# Patient Record
Sex: Male | Born: 1970 | Race: White | Hispanic: No | Marital: Single | Smoking: Former smoker
Health system: Southern US, Community
[De-identification: ages and names within clinical notes are randomized; demographics above are authoritative.]

## PROBLEM LIST (undated history)

## (undated) DIAGNOSIS — F419 Anxiety disorder, unspecified: Secondary | ICD-10-CM

## (undated) DIAGNOSIS — I219 Acute myocardial infarction, unspecified: Secondary | ICD-10-CM

## (undated) DIAGNOSIS — F32A Depression, unspecified: Secondary | ICD-10-CM

## (undated) DIAGNOSIS — E785 Hyperlipidemia, unspecified: Secondary | ICD-10-CM

## (undated) DIAGNOSIS — F329 Major depressive disorder, single episode, unspecified: Secondary | ICD-10-CM

## (undated) DIAGNOSIS — I10 Essential (primary) hypertension: Secondary | ICD-10-CM

## (undated) DIAGNOSIS — J189 Pneumonia, unspecified organism: Secondary | ICD-10-CM

## (undated) DIAGNOSIS — I2510 Atherosclerotic heart disease of native coronary artery without angina pectoris: Secondary | ICD-10-CM

## (undated) DIAGNOSIS — J309 Allergic rhinitis, unspecified: Secondary | ICD-10-CM

## (undated) DIAGNOSIS — K859 Acute pancreatitis without necrosis or infection, unspecified: Secondary | ICD-10-CM

## (undated) DIAGNOSIS — K219 Gastro-esophageal reflux disease without esophagitis: Secondary | ICD-10-CM

## (undated) DIAGNOSIS — J45909 Unspecified asthma, uncomplicated: Secondary | ICD-10-CM

## (undated) HISTORY — DX: Depression, unspecified: F32.A

## (undated) HISTORY — DX: Acute pancreatitis without necrosis or infection, unspecified: K85.90

## (undated) HISTORY — PX: CARDIAC CATHETERIZATION: SHX172

## (undated) HISTORY — DX: Gastro-esophageal reflux disease without esophagitis: K21.9

## (undated) HISTORY — PX: APPENDECTOMY: SHX54

## (undated) HISTORY — DX: Allergic rhinitis, unspecified: J30.9

## (undated) HISTORY — DX: Unspecified asthma, uncomplicated: J45.909

## (undated) HISTORY — DX: Atherosclerotic heart disease of native coronary artery without angina pectoris: I25.10

---
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
## 2013-08-24 ENCOUNTER — Inpatient Hospital Stay (HOSPITAL_COMMUNITY)
Admission: EM | Admit: 2013-08-24 | Discharge: 2013-08-26 | DRG: 195 | Disposition: A | Discharge status: Home / Self Care | Payer: 59 | Attending: Internal Medicine | Admitting: Internal Medicine

## 2013-08-24 ENCOUNTER — Encounter (HOSPITAL_COMMUNITY): Payer: Self-pay | Admitting: Emergency Medicine

## 2013-08-24 DIAGNOSIS — Z7902 Long term (current) use of antithrombotics/antiplatelets: Secondary | ICD-10-CM

## 2013-08-24 DIAGNOSIS — Z79899 Other long term (current) drug therapy: Secondary | ICD-10-CM | POA: Diagnosis not present

## 2013-08-24 DIAGNOSIS — R001 Bradycardia, unspecified: Secondary | ICD-10-CM

## 2013-08-24 DIAGNOSIS — R0602 Shortness of breath: Secondary | ICD-10-CM

## 2013-08-24 DIAGNOSIS — E876 Hypokalemia: Secondary | ICD-10-CM | POA: Diagnosis present

## 2013-08-24 DIAGNOSIS — I252 Old myocardial infarction: Secondary | ICD-10-CM

## 2013-08-24 DIAGNOSIS — R05 Cough: Secondary | ICD-10-CM

## 2013-08-24 DIAGNOSIS — Z9861 Coronary angioplasty status: Secondary | ICD-10-CM | POA: Diagnosis not present

## 2013-08-24 DIAGNOSIS — R059 Cough, unspecified: Secondary | ICD-10-CM

## 2013-08-24 DIAGNOSIS — D509 Iron deficiency anemia, unspecified: Secondary | ICD-10-CM

## 2013-08-24 DIAGNOSIS — F3289 Other specified depressive episodes: Secondary | ICD-10-CM | POA: Diagnosis present

## 2013-08-24 DIAGNOSIS — F411 Generalized anxiety disorder: Secondary | ICD-10-CM | POA: Diagnosis present

## 2013-08-24 DIAGNOSIS — R0789 Other chest pain: Secondary | ICD-10-CM

## 2013-08-24 DIAGNOSIS — E785 Hyperlipidemia, unspecified: Secondary | ICD-10-CM

## 2013-08-24 DIAGNOSIS — J329 Chronic sinusitis, unspecified: Secondary | ICD-10-CM | POA: Diagnosis present

## 2013-08-24 DIAGNOSIS — I10 Essential (primary) hypertension: Secondary | ICD-10-CM | POA: Diagnosis present

## 2013-08-24 DIAGNOSIS — Z7982 Long term (current) use of aspirin: Secondary | ICD-10-CM | POA: Diagnosis not present

## 2013-08-24 DIAGNOSIS — I498 Other specified cardiac arrhythmias: Secondary | ICD-10-CM | POA: Diagnosis present

## 2013-08-24 DIAGNOSIS — F329 Major depressive disorder, single episode, unspecified: Secondary | ICD-10-CM

## 2013-08-24 DIAGNOSIS — J189 Pneumonia, unspecified organism: Secondary | ICD-10-CM | POA: Diagnosis present

## 2013-08-24 DIAGNOSIS — I2510 Atherosclerotic heart disease of native coronary artery without angina pectoris: Secondary | ICD-10-CM | POA: Diagnosis present

## 2013-08-24 HISTORY — DX: Hyperlipidemia, unspecified: E78.5

## 2013-08-24 HISTORY — DX: Pneumonia, unspecified organism: J18.9

## 2013-08-24 HISTORY — DX: Acute myocardial infarction, unspecified: I21.9

## 2013-08-24 HISTORY — DX: Essential (primary) hypertension: I10

## 2013-08-24 HISTORY — DX: Anxiety disorder, unspecified: F41.9

## 2013-08-24 LAB — CBC WITH DIFFERENTIAL/PLATELET
BASOS ABS: 0 10*3/uL (ref 0.0–0.1)
Basophils Relative: 0 % (ref 0–1)
EOS PCT: 5 % (ref 0–5)
Eosinophils Absolute: 0.4 10*3/uL (ref 0.0–0.7)
HEMATOCRIT: 32.8 % — AB (ref 39.0–52.0)
Hemoglobin: 11.1 g/dL — ABNORMAL LOW (ref 13.0–17.0)
LYMPHS ABS: 1.1 10*3/uL (ref 0.7–4.0)
LYMPHS PCT: 17 % (ref 12–46)
MCH: 26.9 pg (ref 26.0–34.0)
MCHC: 33.8 g/dL (ref 30.0–36.0)
MCV: 79.4 fL (ref 78.0–100.0)
MONO ABS: 0.4 10*3/uL (ref 0.1–1.0)
MONOS PCT: 6 % (ref 3–12)
NEUTROS ABS: 4.7 10*3/uL (ref 1.7–7.7)
Neutrophils Relative %: 72 % (ref 43–77)
Platelets: 225 10*3/uL (ref 150–400)
RBC: 4.13 MIL/uL — AB (ref 4.22–5.81)
RDW: 12.6 % (ref 11.5–15.5)
WBC: 6.6 10*3/uL (ref 4.0–10.5)

## 2013-08-24 LAB — COMPREHENSIVE METABOLIC PANEL
ALT: 28 U/L (ref 0–53)
AST: 25 U/L (ref 0–37)
Albumin: 3.5 g/dL (ref 3.5–5.2)
Alkaline Phosphatase: 67 U/L (ref 39–117)
Anion gap: 13 (ref 5–15)
BILIRUBIN TOTAL: 0.5 mg/dL (ref 0.3–1.2)
BUN: 10 mg/dL (ref 6–23)
CHLORIDE: 99 meq/L (ref 96–112)
CO2: 27 meq/L (ref 19–32)
Calcium: 9.1 mg/dL (ref 8.4–10.5)
Creatinine, Ser: 0.83 mg/dL (ref 0.50–1.35)
GFR calc non Af Amer: >90 mL/min (ref >90)
Glucose, Bld: 99 mg/dL (ref 70–99)
Potassium: 3.5 mEq/L — ABNORMAL LOW (ref 3.7–5.3)
Sodium: 139 mEq/L (ref 137–147)
Total Protein: 7.4 g/dL (ref 6.0–8.3)

## 2013-08-24 LAB — CBC
HCT: 28 % — ABNORMAL LOW (ref 39.0–52.0)
Hemoglobin: 9.5 g/dL — ABNORMAL LOW (ref 13.0–17.0)
MCH: 26.9 pg (ref 26.0–34.0)
MCHC: 33.9 g/dL (ref 30.0–36.0)
MCV: 79.3 fL (ref 78.0–100.0)
PLATELETS: 184 10*3/uL (ref 150–400)
RBC: 3.53 MIL/uL — ABNORMAL LOW (ref 4.22–5.81)
RDW: 12.7 % (ref 11.5–15.5)
WBC: 5 10*3/uL (ref 4.0–10.5)

## 2013-08-24 LAB — I-STAT TROPONIN, ED: Troponin i, poc: 0 ng/mL (ref 0.00–0.08)

## 2013-08-24 LAB — TROPONIN I

## 2013-08-24 MED ORDER — ALBUTEROL SULFATE (2.5 MG/3ML) 0.083% IN NEBU: 5.0000 mg | INHALATION_SOLUTION | RESPIRATORY_TRACT | Status: AC | PRN

## 2013-08-24 MED ORDER — MONTELUKAST SODIUM 10 MG PO TABS
10.0000 mg | ORAL_TABLET | Freq: Every day | ORAL | Status: DC
  Administered 2013-08-25: 10 mg via ORAL
  Filled 2013-08-24 (×3): qty 1

## 2013-08-24 MED ORDER — DEXTROSE 5 % IV SOLN
500.0000 mg | INTRAVENOUS | Status: AC
  Administered 2013-08-25: 500 mg via INTRAVENOUS
  Filled 2013-08-24: qty 500

## 2013-08-24 MED ORDER — ASPIRIN 81 MG PO CHEW
81.0000 mg | CHEWABLE_TABLET | Freq: Every day | ORAL | Status: DC
  Administered 2013-08-25: 81 mg via ORAL
  Filled 2013-08-24 (×2): qty 1

## 2013-08-24 MED ORDER — OXYMETAZOLINE HCL 0.05 % NA SOLN
1.0000 | Freq: Every day | NASAL | Status: DC
  Administered 2013-08-25 – 2013-08-26 (×2): 1 via NASAL
  Filled 2013-08-24: qty 15

## 2013-08-24 MED ORDER — ALPRAZOLAM ER 1 MG PO TB24: 1.0000 mg | ORAL_TABLET | Freq: Every day | ORAL | Status: DC

## 2013-08-24 MED ORDER — GUAIFENESIN-DM 100-10 MG/5ML PO SYRP
5.0000 mL | ORAL_SOLUTION | ORAL | Status: DC | PRN
  Administered 2013-08-25: 5 mL via ORAL
  Filled 2013-08-24: qty 5

## 2013-08-24 MED ORDER — BENZONATATE 100 MG PO CAPS
100.0000 mg | ORAL_CAPSULE | Freq: Once | ORAL | Status: AC
  Administered 2013-08-24: 100 mg via ORAL
  Filled 2013-08-24: qty 1

## 2013-08-24 MED ORDER — DEXTROSE 5 % IV SOLN
500.0000 mg | Freq: Once | INTRAVENOUS | Status: AC
  Administered 2013-08-24: 500 mg via INTRAVENOUS
  Filled 2013-08-24: qty 500

## 2013-08-24 MED ORDER — ALPRAZOLAM 0.5 MG PO TABS
1.0000 mg | ORAL_TABLET | Freq: Every day | ORAL | Status: DC
  Administered 2013-08-25 (×2): 1 mg via ORAL
  Filled 2013-08-24 (×2): qty 2

## 2013-08-24 MED ORDER — IPRATROPIUM BROMIDE 0.02 % IN SOLN
0.5000 mg | Freq: Once | RESPIRATORY_TRACT | Status: AC
  Administered 2013-08-24: 0.5 mg via RESPIRATORY_TRACT
  Filled 2013-08-24: qty 2.5

## 2013-08-24 MED ORDER — DEXTROSE 5 % IV SOLN
1.0000 g | INTRAVENOUS | Status: DC
  Administered 2013-08-25: 1 g via INTRAVENOUS
  Filled 2013-08-24: qty 10

## 2013-08-24 MED ORDER — ALBUTEROL SULFATE HFA 108 (90 BASE) MCG/ACT IN AERS: 2.0000 | INHALATION_SPRAY | Freq: Four times a day (QID) | RESPIRATORY_TRACT | Status: DC | PRN

## 2013-08-24 MED ORDER — DEXTROSE 5 % IV SOLN
1.0000 g | Freq: Once | INTRAVENOUS | Status: AC
  Administered 2013-08-24: 1 g via INTRAVENOUS
  Filled 2013-08-24: qty 10

## 2013-08-24 MED ORDER — METOPROLOL SUCCINATE ER 100 MG PO TB24
100.0000 mg | ORAL_TABLET | Freq: Every day | ORAL | Status: DC
  Filled 2013-08-24 (×2): qty 1

## 2013-08-24 MED ORDER — LORATADINE 10 MG PO TABS
10.0000 mg | ORAL_TABLET | Freq: Every evening | ORAL | Status: DC
  Administered 2013-08-25: 10 mg via ORAL
  Filled 2013-08-24 (×2): qty 1

## 2013-08-24 MED ORDER — RAMIPRIL 10 MG PO CAPS
10.0000 mg | ORAL_CAPSULE | Freq: Every day | ORAL | Status: DC
  Administered 2013-08-25 – 2013-08-26 (×2): 10 mg via ORAL
  Filled 2013-08-24 (×2): qty 1

## 2013-08-24 MED ORDER — ATORVASTATIN CALCIUM 80 MG PO TABS
80.0000 mg | ORAL_TABLET | Freq: Every day | ORAL | Status: DC
Start: 2013-08-25 — End: 2013-08-26
  Administered 2013-08-25: 80 mg via ORAL
  Filled 2013-08-24 (×2): qty 1

## 2013-08-24 MED ORDER — SERTRALINE HCL 50 MG PO TABS
150.0000 mg | ORAL_TABLET | Freq: Every morning | ORAL | Status: DC
  Administered 2013-08-25 – 2013-08-26 (×2): 150 mg via ORAL
  Filled 2013-08-24 (×2): qty 1

## 2013-08-24 MED ORDER — CLOPIDOGREL BISULFATE 75 MG PO TABS
75.0000 mg | ORAL_TABLET | Freq: Every day | ORAL | Status: DC
  Administered 2013-08-25: 75 mg via ORAL
  Filled 2013-08-24 (×3): qty 1

## 2013-08-24 MED ORDER — ENOXAPARIN SODIUM 40 MG/0.4ML ~~LOC~~ SOLN
40.0000 mg | SUBCUTANEOUS | Status: DC
  Administered 2013-08-24 – 2013-08-25 (×2): 40 mg via SUBCUTANEOUS
  Filled 2013-08-24 (×3): qty 0.4

## 2013-08-24 MED ORDER — ALBUTEROL SULFATE (2.5 MG/3ML) 0.083% IN NEBU
5.0000 mg | INHALATION_SOLUTION | Freq: Once | RESPIRATORY_TRACT | Status: AC
  Administered 2013-08-24: 5 mg via RESPIRATORY_TRACT
  Filled 2013-08-24: qty 6

## 2013-08-24 NOTE — ED Notes (Signed)
Pt is on day 3 of Levaquin for sinus infection without relief or improvement

## 2013-08-24 NOTE — Progress Notes (Signed)
Patient arrived from the ED via stretcher and transferred to room 3E08. VSS. Patient alert and oriented time four. Patient weighed and telemetry monitor put on the patient and CMT notified. Currently patient complains of no pain or discomfort at this time. [place] sandwich given. Will continue to monitor to end of shift.

## 2013-08-24 NOTE — ED Notes (Signed)
Pt to ED for evaluation of chest tightness onset 3 days ago- pt went to [facility] prior to arrival- chest x-ray shows pt has pneumonia.  Pt has cardiac hx, was instructed to come to ED for treatment.  Pt reports he is currently taking Levaquin for a sinus infection.

## 2013-08-24 NOTE — ED Provider Notes (Signed)
CSN: [id number]     Arrival date & time 08/24/13  1509 History   First MD Initiated Contact with Patient 08/24/13 1632     Chief Complaint  Patient presents with  . Chest Pain     (Consider location/radiation/quality/duration/timing/severity/associated sxs/prior Treatment) HPI Gary Atkinson is a 43 y.o. male with hx of MI at age 44, stenting twice, htn, hyperlipidemia, who presents to emergency department complaining of chest pain and shortness of breath. Patient states he started to feel like he had either sinusitis or upper respiratory infection coming on 4 days ago. He called his ENT Dr., given he has history of chronic sinusitis, who called him in Levaquin. He has been on Levaquin for 4 days. He states his cough since then has gradually worsened. He went to urgent care today, where he had a chest x-ray done which showed left mid lung infiltrate. Patient with an extensive cardiac history, complaining of shortness of breath and chest pressure, was sent to emergency department for further evaluation. Pt denies fever, admits to chills. Denies n/v/ abdominal pain.    Past Medical History  Diagnosis Date  . Anxiety   . Hypertension   . Hyperlipemia   . MI (myocardial infarction)    History reviewed. No pertinent past surgical history. No family history on file. History  Substance Use Topics  . Smoking status: Never Smoker   . Smokeless tobacco: Not on file  . Alcohol Use: Yes    Review of Systems  Constitutional: Positive for chills. Negative for fever.  HENT: Positive for congestion and sinus pressure.   Respiratory: Positive for cough, chest tightness and shortness of breath.   Cardiovascular: Positive for chest pain. Negative for palpitations and leg swelling.  Gastrointestinal: Negative for nausea, vomiting, abdominal pain, diarrhea and abdominal distention.  Musculoskeletal: Negative for arthralgias, myalgias, neck pain and neck stiffness.  Skin: Negative for rash.   Allergic/Immunologic: Negative for immunocompromised state.  Neurological: Positive for weakness. Negative for dizziness, light-headedness, numbness and headaches.      Allergies  Review of patient's allergies indicates no known allergies.  Home Medications   Prior to Admission medications   Medication Sig Start Date End Date Taking? Authorizing Provider  albuterol (PROVENTIL HFA;VENTOLIN HFA) 108 (90 BASE) MCG/ACT inhaler Inhale 2 puffs into the lungs every 6 (six) hours as needed for wheezing or shortness of breath.   Yes Historical Provider, MD  ALPRAZolam (XANAX XR) 1 MG 24 hr tablet Take 1 mg by mouth at bedtime.   Yes Historical Provider, MD  aspirin 81 MG tablet Take 81 mg by mouth at bedtime.   Yes Historical Provider, MD  clopidogrel (PLAVIX) 75 MG tablet Take 75 mg by mouth at bedtime.   Yes Historical Provider, MD  levocetirizine (XYZAL) 5 MG tablet Take 5 mg by mouth every evening.   Yes Historical Provider, MD  levofloxacin (LEVAQUIN) 500 MG tablet Take 500 mg by mouth daily.   Yes Historical Provider, MD  metoprolol succinate (TOPROL-XL) 100 MG 24 hr tablet Take 100 mg by mouth at bedtime. Take with or immediately following a meal.   Yes Historical Provider, MD  montelukast (SINGULAIR) 10 MG tablet Take 10 mg by mouth at bedtime.   Yes Historical Provider, MD  oxymetazoline (QC NASAL RELIEF SINUS) 0.05 % nasal spray Place 1 spray into both nostrils daily.   Yes Historical Provider, MD  ramipril (ALTACE) 10 MG capsule Take 10 mg by mouth daily.   Yes Historical Provider, MD  rosuvastatin (CRESTOR) 40  MG tablet Take 40 mg by mouth at bedtime.   Yes Historical Provider, MD  sertraline (ZOLOFT) 100 MG tablet Take 150 mg by mouth every morning.   Yes Historical Provider, MD   BP 136/81  Pulse 66  Temp(Src) 98.8 F (37.1 C) (Oral)  Resp 16  Ht 6' (1.829 m)  Wt 185 lb (83.915 kg)  BMI 25.08 kg/m2  SpO2 99% Physical Exam  Nursing note and vitals reviewed. Constitutional:  He is oriented to person, place, and time. He appears well-developed and well-nourished. No distress.  HENT:  Head: Normocephalic and atraumatic.  Eyes: Conjunctivae are normal.  Neck: Neck supple.  Cardiovascular: Normal rate, regular rhythm, normal heart sounds and intact distal pulses.   Pulmonary/Chest: Effort normal. No respiratory distress. He has no wheezes. He has no rales. He exhibits no tenderness.  Abdominal: Soft. Bowel sounds are normal. He exhibits no distension. There is no tenderness. There is no rebound.  Musculoskeletal: He exhibits no edema.  Neurological: He is alert and oriented to person, place, and time.  Skin: Skin is warm and dry.    ED Course  Procedures (including critical care time) Labs Review Labs Reviewed  CBC WITH DIFFERENTIAL - Abnormal; Notable for the following:    RBC 4.13 (*)    Hemoglobin 11.1 (*)    HCT 32.8 (*)    All other components within normal limits  COMPREHENSIVE METABOLIC PANEL - Abnormal; Notable for the following:    Potassium 3.5 (*)    All other components within normal limits  I-STAT TROPOININ, ED    Imaging Review No results found.   EKG Interpretation   Date/Time:  Sunday August 24 2013 15:19:02 EDT Ventricular Rate:  67 PR Interval:  168 QRS Duration: 94 QT Interval:  390 QTC Calculation: 412 R Axis:   35 Text Interpretation:  Normal sinus rhythm Normal ECG No prior for  comparsion Confirmed by Gwendolyn Grant  MD, BLAIR ([id number]) on 08/24/2013 4:26:35 PM      MDM   Final diagnoses:  CAP (community acquired pneumonia)      Patient with cardiac history, prior stenting, presents with chest pain, stress of breath, cough, on Levaquin for 4 days. He went to urgent care where he had a chest x-ray done, with imaging and results brought by patient's, report stating he has a patchy left midlung infiltrate. Will check troponin, labs, breathing treatment ordered. Lungs are clear on exam at this time.   6:23 PM Patient's labs  unremarkable. Troponin negative. EKG normal. Given patient failed outpatient treatment on Levaquin, worsening cough, pneumonia on chest x-ray, will need admission and chest pain rule out.  Spoke with teaching service, given patient is unassigned. Will admit.  Filed Vitals:   08/24/13 1521 08/24/13 1731  BP: 136/81 136/67  Pulse: 66 58  Temp: 98.8 F (37.1 C)   TempSrc: Oral   Resp: 16 22  Height: 6' (1.829 m)   Weight: 185 lb (83.915 kg)   SpO2: 99% 100%        Lottie Mussel, PA-C 08/24/13 1824

## 2013-08-24 NOTE — H&P (Signed)
Date: 08/24/2013               Patient Name:  Gary Atkinson MRN: [id number]  DOB: 1970/12/13 Age / Sex: 43 y.o., male   PCP: No primary provider on file.         Medical Service: Internal Medicine Teaching Service         Attending Physician: Dr. Aletta Edouard, MD    First Contact: Dr. Senaida Ores Pager: [phone]  Second Contact: Dr. Zada Girt Pager: [phone]       After Hours (After 5p/  First Contact Pager: [phone]  weekends / holidays): Second Contact Pager: [phone]   Chief Complaint: SOB  History of Present Illness: Mr. Gary Atkinson is a 43 year old man with history of CAD, MI s/p stent x2 in 2010, HTN, chronic sinusitis presenting with SOB and chest pressure. Tuesday night, he had diaphoresis and chills. The follow day he had sinus pressure, SOB, chest pressure, pleuritic chest pain, and nonproductive cough. He called his ENT that follows his chronic sinusitis and was prescribed Levaquin  x 4 days. He used his albuterol inhaler with minimal relief of his symptoms. He was seen in urgent care today where his chest x-ray showed a left mid lung infiltrate. He denies sick contacts. He reports some decreased appetite, lightheadedness with standing/lying down and bilateral foot swelling. Denies weight loss/gain, nausea/vomiting, abdominal pain, diarrhea, paresthesias, rash.  In the ED he received duonebs, benzonatate, 1g IV cetriaxone, and  IV azithromycin.  Meds: Albuterol 16mcg/act inhaler 2 puffs Q6hr prn wheezing or shortness of breath Oxymetazoline 0.05% 1 spray into both nostrils daily Singulair  QHS Xyzal  daily Xanax  QHS Sertraline  QAM ASA  daily Plavix  daily Levaquin  daily Toprol XL  daily Ramipril  daily Rosuvastatin  QHS  Current Facility-Administered Medications  Medication Dose Route Frequency Provider Last Rate Last Dose  . albuterol (PROVENTIL) (2.5 MG/3ML) 0.083% nebulizer solution 5 mg  5 mg Nebulization Q2H PRN  Tatyana A Kirichenko, PA-C      . [START ON 08/25/2013] cefTRIAXone (ROCEPHIN) 1 g in dextrose 5 % 50 mL IVPB  1 g Intravenous Q24H Christen Bame, MD      . enoxaparin (LOVENOX) injection 40 mg  40 mg Subcutaneous Q24H Christen Bame, MD      . guaiFENesin-dextromethorphan (ROBITUSSIN DM) 100-10 MG/5ML syrup 5 mL  5 mL Oral Q4H PRN Christen Bame, MD       Allergies: Allergies as of 08/24/2013  . (No Known Allergies)   Past Medical History  Diagnosis Date  . Anxiety   . Hypertension   . Hyperlipemia   . MI (myocardial infarction)    Past Surgical History Sinus surgery Left foot surgery  No family history on file. Denies any family history  History   Social History  . Marital Status: Single    Spouse Name: N/A    Number of Children: N/A  . Years of Education: N/A   Occupational History  . Not on file.   Social History Main Topics  . Smoking status: Never Smoker   . Smokeless tobacco: Not on file  . Alcohol Use: Yes  . Drug Use: Not on file  . Sexual Activity: Not on file   Other Topics Concern  . Not on file   Social History Narrative  . No narrative on file  Quit tobacco in 2000, previously 1 ppd for 12 years Occasional alcohol Denies drug use  Works in Airline pilot for [employer]  Review of Systems:  Constitutional: +chills, +diaphoresis, +decreased appetite, no weight loss/gain Eyes: no vision changes Ears, nose, mouth, throat, and face: +cough Respiratory: +shortness of breath Cardiovascular: +pleuritic chest pain Gastrointestinal: no nausea/vomiting, no abdominal pain, no constipation, no diarrhea Genitourinary: no dysuria, no hematuria Integument: no rash Hematologic/lymphatic: no bleeding/bruising, +edema Musculoskeletal: no arthralgias, no myalgias Neurological: no paresthesias, no weakness   Physical Exam: Blood pressure 150/78, pulse 80, temperature 98.7 F (37.1 C), temperature source Oral, resp. rate 20, height 6' (1.829 m), weight 186 lb 8 oz  (84.596 kg), SpO2 100.00%. General Apperance: NAD Head: Normocephalic, atraumatic Eyes: PERRL, EOMI, anicteric sclera Ears: Nares normal, septum midline, mucosa normal Throat: Lips, mucosa and tongue normal  Neck: Supple, trachea midline, 2cm JVD Back: No tenderness or bony abnormality  Lungs: Few scattered crackles on bilaterally. No wheezes or ronchi. Breathing comfortably Chest Wall: Nontender, no deformity Heart: Regular rate and rhythm, mild systolic ejection murmur, no rub/gallop Abdomen: Soft, nontender, nondistended, no rebound/guarding Extremities: Normal, atraumatic, warm and well perfused, no edema Pulses: 2+ throughout Skin: No rashes or lesions Neurologic: Alert and oriented x 3. CNII-XII intact. Normal strength and sensation  Lab results: Basic Metabolic Panel:  Recent Labs  16/10/96 1525  NA 139  K 3.5*  CL 99  CO2 27  GLUCOSE 99  BUN 10  CREATININE 0.83  CALCIUM 9.1   Liver Function Tests:  Recent Labs  08/24/13 1525  AST 25  ALT 28  ALKPHOS 67  BILITOT 0.5  PROT 7.4  ALBUMIN 3.5   CBC:  Recent Labs  08/24/13 1525  WBC 6.6  NEUTROABS 4.7  HGB 11.1*  HCT 32.8*  MCV 79.4  PLT 225   Relative neutrophils 72 Absolute neutrophils 4.7 No bands  Cardiac Enzymes: Troponin POC 0.00  Other results: EKG: nonspecific T wave inversion in V1, otherwise normal EKG, normal sinus rhythm, there are no previous tracings available for comparison.  Assessment & Plan by Problem: Active Problems:   CAP (community acquired pneumonia)  SOB, chest pressure, and cough: left mid lung infiltrate on CXR at urgent care. Has taken 3 doses of levaquin . Does not meet SIRS criteria. Failed to improve on levofloxacin  daily. Will also need to rule out for ACS given past history of multiple MI and CAD s/p stent. -Continue 1g ceftriaxone and IV azithromycin  daily -Albuterol neb  Q2hr prn wheezing/shortness of breath -Robitussin DM Q4hr prn  cough -Urine legionella antigen, strep pneumoniae urinary antigen -Sputum culture -Trend troponin -Repeat EKG in AM -Echo  CAD, MI s/p stent x2 in 2010: per patient report, last cath in March 2010 at [facility] in [place] negative and stents patent. Has been having ongoing chest pressure x 4 days. -ACS workup as above. -Echo -continue ASA  daily -continue Plavix  daily -continue rosuvastatin  QHS  HTN: -continue Toprol XL  daily -continue Ramipril  daily  chronic sinusitis: followed by ENT. multiple past sinus surgeries. Stable -Continue Singulair  QHS -Continue Xyzal  daily  Depression/anxiety: chronic, stable -continue Xanax  QHS -continue Sertraline  QAM  FEN: Heart healthy diet  DVT ppx: Lovenox  daily  Dispo: Disposition is deferred at this time, awaiting improvement of current medical problems. Anticipated discharge in approximately 1 day(s).   The patient does not have a current PCP (No primary provider on file.) and does not need an [facility] hospital follow-up appointment after discharge.  The patient does not know have transportation limitations that hinder transportation to clinic appointments.  Signed: Griffin Basil, MD  08/24/2013, 8:50 PM

## 2013-08-25 DIAGNOSIS — R001 Bradycardia, unspecified: Secondary | ICD-10-CM | POA: Diagnosis present

## 2013-08-25 DIAGNOSIS — E785 Hyperlipidemia, unspecified: Secondary | ICD-10-CM

## 2013-08-25 DIAGNOSIS — J189 Pneumonia, unspecified organism: Principal | ICD-10-CM

## 2013-08-25 DIAGNOSIS — I2510 Atherosclerotic heart disease of native coronary artery without angina pectoris: Secondary | ICD-10-CM | POA: Diagnosis present

## 2013-08-25 DIAGNOSIS — E876 Hypokalemia: Secondary | ICD-10-CM

## 2013-08-25 DIAGNOSIS — D509 Iron deficiency anemia, unspecified: Secondary | ICD-10-CM | POA: Diagnosis present

## 2013-08-25 DIAGNOSIS — I359 Nonrheumatic aortic valve disorder, unspecified: Secondary | ICD-10-CM

## 2013-08-25 DIAGNOSIS — D649 Anemia, unspecified: Secondary | ICD-10-CM

## 2013-08-25 DIAGNOSIS — I498 Other specified cardiac arrhythmias: Secondary | ICD-10-CM

## 2013-08-25 DIAGNOSIS — I10 Essential (primary) hypertension: Secondary | ICD-10-CM

## 2013-08-25 LAB — IRON AND TIBC
Iron: 32 ug/dL — ABNORMAL LOW (ref 42–135)
Saturation Ratios: 8 % — ABNORMAL LOW (ref 20–55)
TIBC: 386 ug/dL (ref 215–435)
UIBC: 354 ug/dL (ref 125–400)

## 2013-08-25 LAB — VITAMIN B12: Vitamin B-12: 842 pg/mL (ref 211–911)

## 2013-08-25 LAB — RETICULOCYTES
RBC.: 3.72 MIL/uL — ABNORMAL LOW (ref 4.22–5.81)
RETIC COUNT ABSOLUTE: 67 10*3/uL (ref 19.0–186.0)
Retic Ct Pct: 1.8 % (ref 0.4–3.1)

## 2013-08-25 LAB — LEGIONELLA ANTIGEN, URINE: Legionella Antigen, Urine: NEGATIVE

## 2013-08-25 LAB — STREP PNEUMONIAE URINARY ANTIGEN: STREP PNEUMO URINARY ANTIGEN: NEGATIVE

## 2013-08-25 LAB — FERRITIN: FERRITIN: 37 ng/mL (ref 22–322)

## 2013-08-25 LAB — MAGNESIUM: Magnesium: 1.9 mg/dL (ref 1.5–2.5)

## 2013-08-25 LAB — FOLATE: Folate: 15 ng/mL

## 2013-08-25 LAB — HIV ANTIBODY (ROUTINE TESTING W REFLEX): HIV 1&2 Ab, 4th Generation: NONREACTIVE

## 2013-08-25 LAB — TROPONIN I

## 2013-08-25 MED ORDER — PANTOPRAZOLE SODIUM 40 MG PO TBEC
40.0000 mg | DELAYED_RELEASE_TABLET | Freq: Two times a day (BID) | ORAL | Status: DC
  Administered 2013-08-25 – 2013-08-26 (×2): 40 mg via ORAL
  Filled 2013-08-25 (×2): qty 1

## 2013-08-25 MED ORDER — POTASSIUM CHLORIDE CRYS ER 20 MEQ PO TBCR
40.0000 meq | EXTENDED_RELEASE_TABLET | Freq: Every day | ORAL | Status: DC
  Administered 2013-08-25 – 2013-08-26 (×2): 40 meq via ORAL
  Filled 2013-08-25: qty 2

## 2013-08-25 MED ORDER — AZITHROMYCIN 500 MG PO TABS
500.0000 mg | ORAL_TABLET | Freq: Every day | ORAL | Status: DC
  Administered 2013-08-26: 500 mg via ORAL
  Filled 2013-08-25: qty 1

## 2013-08-25 MED ORDER — AMOXICILLIN 500 MG PO CAPS
500.0000 mg | ORAL_CAPSULE | Freq: Three times a day (TID) | ORAL | Status: DC
  Administered 2013-08-26 (×2): 500 mg via ORAL
  Filled 2013-08-25 (×4): qty 1

## 2013-08-25 NOTE — Progress Notes (Signed)
I have seen the patient and reviewed the daily progress note by Byrne MS IV and discussed the care of the patient with them.  Please see note for my findings, assessment, and plans/additions.   

## 2013-08-25 NOTE — H&P (Signed)
INTERNAL MEDICINE TEACHING ATTENDING NOTE  Day 1 of stay  Patient name: Gary Atkinson  MRN: [id number] Date of birth: [birth year]   Key clinical points and exam                                                           43 y.o.man with past medical history of MI s/p stenting, hypertension presenting with shortness of breath and chest pressure yesterday. He also reported diaphoresis and chills, non-productive cough for 4 days, no fever. He failed Levofloxacin treatment as outpatient. I have read Dr Marjory Sneddon HPI and agree with the chronoclogy of events and doumentation.  Today when I met him, he feels slightly improved. He is sitting in bed, no acute distress, not wearing oxygen, speaking in paragraphs. He does complain of some chest pressure across his chest with no radiation, no relation to cough or breathing. He is alert and oriented. His chest exam is positive for some tenderness on palpation to the left side in the front. He also has rales to auscultation in the left lower lung field. Otherwise good air movement, and clear on the right side. He has no wheezing or rhonchi. His cardiac exam has regular rate and rhythm, bradycardic to 50s, no murmurs heard. His abdomen is benign. He has no pedal edema.      I have reviewed the chart, lab results, EKG, imaging and relevant notes of this patient.   Assessment and Plan                                                                        Agree with the treatment for CAP as started by my team.   For bradycardia, which was reported to be down to 20s overnight when he was sleeping, I would consult cardiology. He is also on metoprolol 100 mg XL, which has been held by my team for today. We will also review his medications and check for possible interactions. Electrolytes will be repleted as needed. K is low, we will check Mg.  For chest pressure, I think its musculoskeletal from repeated bouts of coughing. He has been ruled out of MI with serial cardiac  markers and EKGs.    I have seen and evaluated this patient and discussed it with my IM resident team.  Please see the rest of the plan per resident note from today.   Gary Atkinson, Gary Atkinson 08/25/2013, 9:36 AM.

## 2013-08-25 NOTE — Progress Notes (Signed)
UR completed Carylon Tamburro K. Versie Soave, RN, BSN, MSHL, CCM  08/25/2013 11:43 AM

## 2013-08-25 NOTE — Progress Notes (Addendum)
Subjective: Still reports chest pressure which he believes is from the cough.  Overnight was noticed to have bradycardia to a low of 20s without symptoms.  No new symptoms  Interval events Tele>>Bradycardia lowest 35 during sleep  This is day 2 of antibiotics with Rocephin and Azithro Strep urine ag >neg  Objective: Vital signs in last 24 hours: Filed Vitals:   08/24/13 2041 08/25/13 0217 08/25/13 0635 08/25/13 0644  BP:  102/50 109/55   Pulse:  71 56   Temp:  97.8 F (36.6 C) 97.6 F (36.4 C)   TempSrc:  Oral Oral   Resp:  18 17   Height:      Weight:    185 lb 6.4 oz (84.097 kg)  SpO2: 100% 98% 97%    Weight change:   Intake/Output Summary (Last 24 hours) at 08/25/13 0931 Last data filed at 08/25/13 0847  Gross per 24 hour  Intake    360 ml  Output    975 ml  Net   -615 ml    General: Vital signs reviewed. No distress.  Lungs: Clear to auscultation bilaterally  Heart: RRR; no extra sounds or murmurs  Abdomen: Bowel sounds present, soft, nontender; no hepatosplenomegaly  Extremities: No bilateral ankle edema  Neurologic: Alert and oriented x3. Moves all extremities  Lab Results: Basic Metabolic Panel:  Recent Labs Lab 08/24/13 1525  NA 139  K 3.5*  CL 99  CO2 27  GLUCOSE 99  BUN 10  CREATININE 0.83  CALCIUM 9.1   Liver Function Tests:  Recent Labs Lab 08/24/13 1525  AST 25  ALT 28  ALKPHOS 67  BILITOT 0.5  PROT 7.4  ALBUMIN 3.5   CBC:  Recent Labs Lab 08/24/13 1525 08/24/13 2215  WBC 6.6 5.0  NEUTROABS 4.7  --   HGB 11.1* 9.5*  HCT 32.8* 28.0*  MCV 79.4 79.3  PLT 225 184   Cardiac Enzymes:  Recent Labs Lab 08/24/13 2215 08/25/13 0407  TROPONINI <0.30 <0.30   Micro Results: No results found for this or any previous visit (from the past 240 hour(s)). Studies/Results: No results found. Medications: I have reviewed the patient's current medications. Scheduled Meds: . ALPRAZolam  1 mg Oral QHS  . aspirin  81 mg Oral QHS  .  atorvastatin  80 mg Oral q1800  . azithromycin (ZITHROMAX) 500 MG IVPB  500 mg Intravenous Q24H  . cefTRIAXone (ROCEPHIN)  IV  1 g Intravenous Q24H  . clopidogrel  75 mg Oral QHS  . enoxaparin (LOVENOX) injection  40 mg Subcutaneous Q24H  . loratadine  10 mg Oral QPM  . montelukast  10 mg Oral QHS  . oxymetazoline  1 spray Each Nare Daily  . ramipril  10 mg Oral Daily  . sertraline  150 mg Oral q morning - 10a   Continuous Infusions:  PRN Meds:.guaiFENesin-dextromethorphan Assessment/Plan: Principal Problem:   CAP (community acquired pneumonia) Active Problems:   CAD (coronary artery disease), native coronary artery   Bradycardia    CAP: Unchanged since admission. Chest x-ray performed from urgent care revealed a left mid lobe patchy infiltrates. He has been afebrile since admission. Failed outpatient Levaquin. Chest exam today is unrevealing. The patient continues to complain of cough, and chest pressure. Urine strep antigen, negative. Urine Legionella antigen, still pending. Presentation consistent with community-acquired pneumonia versus bronchitis. Plan. -continue with Rocephin and azithromycin. This is day 2 of treatment. -At discharge, which I anticipate to happen within the next 24-48 hours, he can quickly  be converted Po Amoxillin plus azithromycin to complete 7 days of treatment.  - will wait cards eval before disposition plan  CAD: patient has an extensive past medical history of coronary artery disease with 2 MIs the first one being at age 47. He reports that he sees a cardiologist at [facility]. Most recently in March 2015, he had left heart catheterization, which he states was unremarkable with his stents being open/patent. His current chest pressure is concerning for cardiac ischemia etiology even though it can be from his pneumonia. We will pursue a cardiology consult while he is inpatient, especially given his bradycardia overnight. Initial EKG was unremarkable and  troponins have been negative. Plan. -Keep on telemetry. -Echocardiogram. -Repeat EKG with chest pain. -Cardiology has been consulted  >>They recommend no further cardiac workup. -Can followup with his [facility] cardiologist, Dr. Shea Evans on discharge. -Will try to obtain records from Dr. Mendel Corning office.  Bradycardia: patient was noted to have bradycardia as a low as 20 beats per minute. This happened during sleep. He was asymptomatic. Review of telemetry reveals heart rates ranging from 35-60 beats per minute. He is also on metoprolol 100 mg XL which we are holding. Electrolytes reveal mild hypokalemia of 3.5. Plan  - we consulted cardiology, given his extensive coronary artery disease. - hold metoprolol 100 mg daily - discussed with Dr Mayford Knife about bradycardia. She will reevaluate and make recs  - Monitor and replace electrolytes as needed - Keep on telemetry  Anemia: hemoglobin 9.5. No known baseline. MCV is low normal at 79.3. Question of iron deficiency. Patient on dual antiplatelet therapy with aspirin and Plavix. Does not endorse GI bleeding symptoms. Plan. - Obtained anemia panel - FOBT   Hypokalemia: potassium 3.5. Will check magnesium level. Replace with by mouth Kdur. Monitor with BMPs  FEN: Heart healthy diet   DVT ppx: Lovenox  daily   Dispo: Disposition is deferred at this time, awaiting improvement of current medical problems. I anticipate he will be discharged over the next 24 hours  The patient does not have a current PCP (No primary provider on file.) and does not need an [facility] hospital follow-up appointment after discharge.  The patient does not know have transportation limitations that hinder transportation to clinic appointments.     LOS: 1 day   Dow Adolph PGY 3 - Internal Medicine Teaching Service Pager: [phone] 08/25/2013, 9:31 AM

## 2013-08-25 NOTE — Progress Notes (Signed)
Alert and oriented x 3. Patient continues to complain about chest tightness and SOB with exertion. Patient ambulates in room independently. Paged md and asked if we could have an order for patient to take a shower, and order was given. Denies pain and discomfort.

## 2013-08-25 NOTE — Progress Notes (Signed)
Notified Internal Medicine doctor of patient's heart rate dropping down between 20-40's when sleeping. When awakened, heart goes back up to the 60's. Patient states he is aware of his heart rate dropping when sleeping and also states has had a sleep study before and was negative for having to wear a CPAP or anything of the like. Patient states he feels fine he stated he just wanted to sleep. Will continue to monitor to end of shift.

## 2013-08-25 NOTE — Consult Note (Addendum)
Admit date: 08/24/2013 Referring Physician  Dr. Dalphine Handing Primary Physician  NONE Primary Cardiologist: Reason for Consultation  Chest pain/bradycardia  HPI: Gary Atkinson is a 43 year old man with history of CAD, MI s/p stent x2 in 2010, HTN, chronic sinusitis presenting with SOB and chest tightness. Tuesday night, he had diaphoresis and chills. The follow day he had sinus pressure, SOB, chest pressure, pleuritic chest pain, and nonproductive cough. He called his ENT that follows his chronic sinusitis and was prescribed Levaquin  x 4 days. He used his albuterol inhaler with minimal relief of his symptoms. He was seen in urgent care where his chest x-ray showed a left mid lung infiltrate. He denies sick contacts. He reports some decreased appetite, lightheadedness with standing/lying down and bilateral foot swelling. Denies weight loss/gain, nausea/vomiting, abdominal pain, diarrhea, paresthesias, rash.  In the ED he received duonebs, benzonatate, 1g IV cetriaxone, and  IV azithromycin.  He was admitted for further treatment of PNA.  He has also been noted to be bradycardic while in the hospital, mostly at night.  The nurse reported that the monitor read out 25bpm on several occasions.  Cardiology is now asked to consult for evaluation of chest pressure and bradycardia.  Cardiac enzymes are negative x 3.  EKG showed sinus bradycardia with no ST changes.    PMH:   Past Medical History  Diagnosis Date  . Anxiety   . Hypertension   . Hyperlipemia   . MI (myocardial infarction)   . Pneumonia      PSH:   Past Surgical History  Procedure Laterality Date  . Appendectomy    . Cardiac catheterization      Allergies:  Review of patient's allergies indicates no known allergies. Prior to Admit Meds:   Prescriptions prior to admission  Medication Sig Dispense Refill  . albuterol (PROVENTIL HFA;VENTOLIN HFA) 108 (90 BASE) MCG/ACT inhaler Inhale 2 puffs into the lungs every 6 (six) hours as  needed for wheezing or shortness of breath.      . ALPRAZolam (XANAX XR) 1 MG 24 hr tablet Take 1 mg by mouth at bedtime.      Marland Kitchen aspirin 81 MG tablet Take 81 mg by mouth at bedtime.      . clopidogrel (PLAVIX) 75 MG tablet Take 75 mg by mouth at bedtime.      Marland Kitchen levocetirizine (XYZAL) 5 MG tablet Take 5 mg by mouth every evening.      Marland Kitchen levofloxacin (LEVAQUIN) 750 MG tablet Take 750 mg by mouth daily.      . metoprolol succinate (TOPROL-XL) 100 MG 24 hr tablet Take 100 mg by mouth at bedtime. Take with or immediately following a meal.      . montelukast (SINGULAIR) 10 MG tablet Take 10 mg by mouth at bedtime.      Marland Kitchen oxymetazoline (QC NASAL RELIEF SINUS) 0.05 % nasal spray Place 1 spray into both nostrils daily.      . ramipril (ALTACE) 10 MG capsule Take 10 mg by mouth daily.      . rosuvastatin (CRESTOR) 40 MG tablet Take 40 mg by mouth at bedtime.      . sertraline (ZOLOFT) 100 MG tablet Take 150 mg by mouth every morning.       Fam HX:   History reviewed. No pertinent family history. Social HX:    History   Social History  . Marital Status: Single    Spouse Name: N/A    Number of Children: N/A  . Years of  Education: N/A   Occupational History  . Not on file.   Social History Main Topics  . Smoking status: Never Smoker   . Smokeless tobacco: Not on file  . Alcohol Use: Yes  . Drug Use: Not on file  . Sexual Activity: Not on file   Other Topics Concern  . Not on file   Social History Narrative  . No narrative on file     ROS:  All 11 ROS were addressed and are negative except what is stated in the HPI  Physical Exam: Blood pressure 109/55, pulse 56, temperature 97.6 F (36.4 C), temperature source Oral, resp. rate 17, height 6' (1.829 m), weight 185 lb 6.4 oz (84.097 kg), SpO2 97.00%.    General: Well developed, well nourished, in no acute distress Head: Eyes PERRLA, No xanthomas.   Normal cephalic and atramatic  Lungs:   Clear bilaterally to auscultation and  percussion. Heart:   HRRR S1 S2 Pulses are 2+ & equal.            No carotid bruit. No JVD.  No abdominal bruits. No femoral bruits. Abdomen: Bowel sounds are positive, abdomen soft and non-tender without masses Extremities:   No clubbing, cyanosis or edema.  DP +1 Neuro: Alert and oriented X 3. Psych:  Good affect, responds appropriately    Labs:   Lab Results  Component Value Date   WBC 5.0 08/24/2013   HGB 9.5* 08/24/2013   HCT 28.0* 08/24/2013   MCV 79.3 08/24/2013   PLT 184 08/24/2013    Recent Labs Lab 08/24/13 1525  NA 139  K 3.5*  CL 99  CO2 27  BUN 10  CREATININE 0.83  CALCIUM 9.1  PROT 7.4  BILITOT 0.5  ALKPHOS 67  ALT 28  AST 25  GLUCOSE 99   No results found for this basename: PTT   No results found for this basename: INR, PROTIME   Lab Results  Component Value Date   TROPONINI <0.30 08/25/2013     No results found for this basename: CHOL   No results found for this basename: HDL   No results found for this basename: LDLCALC   No results found for this basename: TRIG   No results found for this basename: CHOLHDL   No results found for this basename: LDLDIRECT      Radiology:  No results found.  EKG:  NSR with no ST changes  ASSESSMENT:  1.  Chest pain in the setting CAP with left mid lung infiltrate.  He does have a history of CAD with MI and PCI in the past but symptoms currently are most likely related to underlying PNA.   2.  CAP on antibiotics 3.  SOB secondary to #2 4.  ASCAD with MI in 2010 s/p PCI x 2 5.  HTN 6.  Dyslipidemia 7.  Bradycardia - asymptomatic  PLAN:   1.  Chest pain is most likely related to underlying PNA.  His cardiac enzymes are negative and EKG is nonischemic. He says that the tightness he is having now is not like what his angina was in the past.  This tightness started at the same time as the SOB and cough symptoms.  He exercises 3 times weekly and was not having any angina until he started with URI symptoms.   He had a cath in [place] 6 months ago and was told his stents were patent.  At this time no further cardiac workup is needed.  He has just  moved to the area so we will set him up for routine followup in our office.  2.  The patient has asymptomatic bradycardia.  His baseline tele today shows HRs in the 40's.  The tele strips that the nurse stated (and the monitor read out as 25) had a HR of 25 was calculated by the monitor in error.  There are no episodes where the HR dropped to 25.  The QRS is very small on some of the strips and the computer was not picking them up so was inappropriately reading them as low HR when the HR was in the 50's.  Since his HR is in the upper 40's today, recommend holding Toprol today and then restarting at a lower dose tomorrow if HR ok.  Given that he has had an MI in the past, I would like him to be on at least a low dose of BB if HR allows.    Quintella Reichert, MD  08/25/2013  9:30 AM

## 2013-08-25 NOTE — Progress Notes (Signed)
  Echocardiogram 2D Echocardiogram has been performed.  Arvil Chaco 08/25/2013, 10:55 AM

## 2013-08-25 NOTE — Progress Notes (Signed)
Subjective: He believes that has has not improved since admission. His chest pain feels intrapleural, and he endorses pain with deep inspiration. Pt does not believe that the chest pain is cardiac in origin. Of note, he last underwent a cardiac cath 6 months ago (03/2013), which was clean. His cardiologist is Dr. Marylu Lund at [facility]. Overnight, he was believed to have episodes of asymptomatic bradycardia down to 20s while sleeping. This was found to be a calculation error, as the computer was not picking up QRS complexes intermittently. He was able to eat his entire breakfast without any issues.   Objective: Vital signs in last 24 hours: Filed Vitals:   08/25/13 0217 08/25/13 0635 08/25/13 0644 08/25/13 1106  BP: 102/50 109/55  111/62  Pulse: 71 56  48  Temp: 97.8 F (36.6 C) 97.6 F (36.4 C)    TempSrc: Oral Oral    Resp: 18 17    Height:      Weight:   84.097 kg (185 lb 6.4 oz)   SpO2: 98% 97%     Weight change:   Intake/Output Summary (Last 24 hours) at 08/25/13 1116 Last data filed at 08/25/13 0847  Gross per 24 hour  Intake    360 ml  Output    975 ml  Net   -615 ml   General appearance: alert, cooperative and no distress Lungs: clear to auscultation bilaterally, no wheezes, crackles or dullness Heart: regular rate and rhythm, S1, S2 normal, no murmur, click, rub or gallop Abdomen: soft, non-tender; bowel sounds normal; no masses,  no organomegaly Extremities: extremities normal, atraumatic, no cyanosis or edema Pulses: 2+ and symmetric  Lab Results: Basic Metabolic Panel:  Recent Labs Lab 08/24/13 1525  NA 139  K 3.5*  CL 99  CO2 27  GLUCOSE 99  BUN 10  CREATININE 0.83  CALCIUM 9.1   Liver Function Tests:  Recent Labs Lab 08/24/13 1525  AST 25  ALT 28  ALKPHOS 67  BILITOT 0.5  PROT 7.4  ALBUMIN 3.5   CBC:  Recent Labs Lab 08/24/13 1525 08/24/13 2215  WBC 6.6 5.0  NEUTROABS 4.7  --   HGB 11.1* 9.5*  HCT 32.8* 28.0*  MCV 79.4 79.3  PLT  225 184   Cardiac Enzymes:  Recent Labs Lab 08/24/13 2215 08/25/13 0407  TROPONINI <0.30 <0.30   Medications: Scheduled Meds: . ALPRAZolam  1 mg Oral QHS  . aspirin  81 mg Oral QHS  . atorvastatin  80 mg Oral q1800  . azithromycin (ZITHROMAX) 500 MG IVPB  500 mg Intravenous Q24H  . cefTRIAXone (ROCEPHIN)  IV  1 g Intravenous Q24H  . clopidogrel  75 mg Oral QHS  . enoxaparin (LOVENOX) injection  40 mg Subcutaneous Q24H  . loratadine  10 mg Oral QPM  . montelukast  10 mg Oral QHS  . oxymetazoline  1 spray Each Nare Daily  . potassium chloride  40 mEq Oral Daily  . ramipril  10 mg Oral Daily  . sertraline  150 mg Oral q morning - 10a   Continuous Infusions:  PRN Meds:.guaiFENesin-dextromethorphan  Assessment/Plan: Principal Problem:   CAP (community acquired pneumonia) Active Problems:   CAD (coronary artery disease), native coronary artery   Bradycardia   Benign essential HTN   Dyslipidemia   Iron deficiency anemia, unspecified  Mr. Mallon is a 43 year old man with history of CAD, MI s/p stent x2 in 2010, HTN, chronic sinusitis who presented with CAP.   Community-aquired Pneumonia: Chest x-ray performed at  urgent care revealed a left mid lobe patchy infiltrate, consistent with CAP. This has persisted through outpatient Levaquin therapy. Afebrile since admission, WBC 5.0, normal RR and O2 sat, and unrevealing pulmonary exam today. He continues to endorse cough and pleuritic chest pain. Urine strep antigen negative and urine Legionella antigen pending. Also considered bronchitis.  Plan.  - Continue with ceftriazone 1g IV and azithromycin 500 mg IV (day 2 - )  - Consider switching to PO amoxillin and azithromycin for 7 full days of treatment when ready for discharge - F/u urine Legionella antigen - F/u sputum culture - Robitussin DM Q4hr PRN for cough - Albuterol neb  Q2hr PRN for wheezing/shortness of breath  CAD: Pt has an extensive history of CAD s/p 2 MIs with  first one at age 41 and multiple stents. Last cardiac catheterization was in March 2015, which was unremarkable. Chest pressure is most likely related to underlying PNA. ECG unremarkable and troponins negative x3.  Plan.  - Cards consulted, no further recs - F/u echocardiogram - Continue telemetry - Repeat EKG with chest pain.  - Obtain records from Dr. Evalyn Casco office  Bradycardia: Pt had asymptomatic episodes of bradycardia in 30s during sleep, confirmed on telemetry.  Plan  - Cards consulted, recommended holding metoprolol and dose adjustment tomorrow - Hod metoprolol 100 mg daily, will restart lower dose tomorrow if BP remains stable - Continue telemetry  Anemia: Hemoglobin down to 9.5 today, with MCV at 79.3. May be due to iron deficiency anemia. He is currently on dual antiplatelet therapy with aspirin and Plavix and denies GI bleeding.  Plan.  - F/u anemia panel  - FOBT   Hypokalemia: His potassium is at 3.5 today.  - Replace with 40 mEq KCl PO today - Recheck BMP after potassium supplementation - F/u add-on Mg   FEN: Heart healthy diet   DVT ppx: Lovenox  daily   This is a Psychologist, occupational Note.  The care of the patient was discussed with Dr. Zada Girt and the assessment and plan formulated with their assistance.  Please see their attached note for official documentation of the daily encounter.   LOS: 1 day   Foye Clock, Med Student 08/25/2013, 11:16 AM

## 2013-08-26 DIAGNOSIS — D509 Iron deficiency anemia, unspecified: Secondary | ICD-10-CM

## 2013-08-26 LAB — BASIC METABOLIC PANEL
ANION GAP: 11 (ref 5–15)
BUN: 8 mg/dL (ref 6–23)
CALCIUM: 9 mg/dL (ref 8.4–10.5)
CO2: 27 meq/L (ref 19–32)
Chloride: 101 mEq/L (ref 96–112)
Creatinine, Ser: 0.94 mg/dL (ref 0.50–1.35)
GFR calc Af Amer: >90 mL/min (ref >90)
GFR calc non Af Amer: >90 mL/min (ref >90)
GLUCOSE: 121 mg/dL — AB (ref 70–99)
Potassium: 4 mEq/L (ref 3.7–5.3)
Sodium: 139 mEq/L (ref 137–147)

## 2013-08-26 MED ORDER — DSS 100 MG PO CAPS: 100.0000 mg | ORAL_CAPSULE | Freq: Every day | ORAL | Status: DC | PRN

## 2013-08-26 MED ORDER — AMOXICILLIN-POT CLAVULANATE 875-125 MG PO TABS: 1.0000 | ORAL_TABLET | Freq: Two times a day (BID) | ORAL | Status: DC

## 2013-08-26 MED ORDER — GUAIFENESIN-DM 100-10 MG/5ML PO SYRP: 5.0000 mL | ORAL_SOLUTION | ORAL | Status: DC | PRN

## 2013-08-26 MED ORDER — ALPRAZOLAM 0.5 MG PO TABS
1.0000 mg | ORAL_TABLET | Freq: Every evening | ORAL | Status: DC | PRN
Start: 2013-08-26 — End: 2013-08-26

## 2013-08-26 MED ORDER — FERROUS SULFATE 325 (65 FE) MG PO TABS: 325.0000 mg | ORAL_TABLET | Freq: Three times a day (TID) | ORAL | Status: DC

## 2013-08-26 MED ORDER — OMEPRAZOLE 20 MG PO TBEC: 20.0000 mg | DELAYED_RELEASE_TABLET | Freq: Two times a day (BID) | ORAL | Status: DC

## 2013-08-26 MED ORDER — AMOXICILLIN 500 MG PO CAPS: 500.0000 mg | ORAL_CAPSULE | Freq: Three times a day (TID) | ORAL | Status: DC

## 2013-08-26 MED ORDER — FERROUS SULFATE 325 (65 FE) MG PO TABS
325.0000 mg | ORAL_TABLET | Freq: Three times a day (TID) | ORAL | Status: DC
Start: 2013-08-26 — End: 2013-08-26
  Administered 2013-08-26: 325 mg via ORAL
  Filled 2013-08-26 (×3): qty 1

## 2013-08-26 MED ORDER — AZITHROMYCIN 500 MG PO TABS: 500.0000 mg | ORAL_TABLET | Freq: Every day | ORAL | Status: DC

## 2013-08-26 MED ORDER — DOCUSATE SODIUM 100 MG PO CAPS
100.0000 mg | ORAL_CAPSULE | Freq: Every day | ORAL | Status: DC | PRN
Start: 2013-08-26 — End: 2013-08-26
  Filled 2013-08-26: qty 1

## 2013-08-26 NOTE — Progress Notes (Signed)
SUBJECTIVE:  No chest pain.  Still SOB.   PHYSICAL EXAM Filed Vitals:   08/25/13 1106 08/25/13 1834 08/25/13 2158 08/26/13 0601  BP: 111/62 108/61 134/74 121/67  Pulse: 48 59 53 49  Temp:  98.3 F (36.8 C) 98.1 F (36.7 C) 97.7 F (36.5 C)  TempSrc:  Oral Oral Oral  Resp:  Height:      Weight:    183 lb 12.8 oz (83.371 kg)  SpO2:  100% 98% 95%   General:  No distress Lungs:  Clear Heart:  RRR Abdomen:  Normal bowel sounds Extremities:  No edema  LABS: Lab Results  Component Value Date   TROPONINI <0.30 08/25/2013   Results for orders placed during the hospital encounter of 08/24/13 (from the past 24 hour(s))  VITAMIN B12     Status: None   Collection Time    08/25/13 11:49 AM      Result Value Ref Range   Vitamin B-12 842  211 - 911 pg/mL  FOLATE     Status: None   Collection Time    08/25/13 11:49 AM      Result Value Ref Range   Folate 15.0    IRON AND TIBC     Status: Abnormal   Collection Time    08/25/13 11:49 AM      Result Value Ref Range   Iron 32 (*) 42 - 135 ug/dL   TIBC 295  621 - 308 ug/dL   Saturation Ratios 8 (*) 20 - 55 %   UIBC 354  125 - 400 ug/dL  FERRITIN     Status: None   Collection Time    08/25/13 11:49 AM      Result Value Ref Range   Ferritin 37  22 - 322 ng/mL  RETICULOCYTES     Status: Abnormal   Collection Time    08/25/13 11:49 AM      Result Value Ref Range   Retic Ct Pct 1.8  0.4 - 3.1 %   RBC. 3.72 (*) 4.22 - 5.81 MIL/uL   Retic Count, Manual 67.0  19.0 - 186.0 K/uL  BASIC METABOLIC PANEL     Status: Abnormal   Collection Time    08/26/13  4:31 AM      Result Value Ref Range   Sodium 139  137 - 147 mEq/L   Potassium 4.0  3.7 - 5.3 mEq/L   Chloride 101  96 - 112 mEq/L   CO2 27  19 - 32 mEq/L   Glucose, Bld 121 (*) 70 - 99 mg/dL   BUN 8  6 - 23 mg/dL   Creatinine, Ser 6.57  0.50 - 1.35 mg/dL   Calcium 9.0  8.4 - 84.6 mg/dL   GFR calc non Af Amer >90  >90 mL/min   GFR calc Af Amer >90  >90 mL/min   Anion gap 11  5 - 15    Intake/Output Summary (Last 24 hours) at 08/26/13 0859 Last data filed at 08/26/13 0600  Gross per 24 hour  Intake    240 ml  Output    675 ml  Net   -435 ml    ASSESSMENT AND PLAN:  CHEST PAIN:  No further cardiac work up.    BRADYCARDIA:  He does go down into the 20s probably while asleep.  However, he has no symptoms with this.  No change in therapy is needed.  He should not be on beta blocker at  discharge.    Rollene Rotunda 08/26/2013 8:59 AM

## 2013-08-26 NOTE — Discharge Summary (Signed)
Name: Gary Atkinson MRN: [id number] DOB: 1970-03-26 43 y.o. PCP: No primary provider on file.  Date of Admission: 08/24/2013  4:25 PM Date of Discharge: 08/26/2013 Attending Physician: Aletta Edouard, MD  Discharge Diagnosis: Principal Problem:   CAP (community acquired pneumonia) Active Problems:   CAD (coronary artery disease), native coronary artery   Bradycardia   Benign essential HTN   Dyslipidemia   Iron deficiency anemia, unspecified  Discharge Medications:   Medication List    STOP taking these medications       levofloxacin 750 MG tablet  Commonly known as:  LEVAQUIN     metoprolol succinate 100 MG 24 hr tablet  Commonly known as:  TOPROL-XL      TAKE these medications       albuterol 108 (90 BASE) MCG/ACT inhaler  Commonly known as:  PROVENTIL HFA;VENTOLIN HFA  Inhale 2 puffs into the lungs every 6 (six) hours as needed for wheezing or shortness of breath.     ALPRAZolam 1 MG 24 hr tablet  Commonly known as:  XANAX XR  Take 1 mg by mouth at bedtime.     amoxicillin-clavulanate 875-125 MG per tablet  Commonly known as:  AUGMENTIN  Take 1 tablet by mouth 2 (two) times daily.     aspirin 81 MG tablet  Take 81 mg by mouth at bedtime.     azithromycin 500 MG tablet  Commonly known as:  ZITHROMAX  Take 1 tablet (500 mg total) by mouth daily.     clopidogrel 75 MG tablet  Commonly known as:  PLAVIX  Take 75 mg by mouth at bedtime.     DSS 100 MG Caps  Take 100 mg by mouth daily as needed for mild constipation.     ferrous sulfate 325 (65 FE) MG tablet  Take 1 tablet (325 mg total) by mouth 3 (three) times daily with meals.     guaiFENesin-dextromethorphan 100-10 MG/5ML syrup  Commonly known as:  ROBITUSSIN DM  Take 5 mLs by mouth every 4 (four) hours as needed for cough.     levocetirizine 5 MG tablet  Commonly known as:  XYZAL  Take 5 mg by mouth every evening.     montelukast 10 MG tablet  Commonly known as:  SINGULAIR  Take 10 mg by mouth  at bedtime.     Omeprazole 20 MG Tbec  Take 1 tablet (20 mg total) by mouth 2 (two) times daily.     QC NASAL RELIEF SINUS 0.05 % nasal spray  Generic drug:  oxymetazoline  Place 1 spray into both nostrils daily.     ramipril 10 MG capsule  Commonly known as:  ALTACE  Take 10 mg by mouth daily.     rosuvastatin 40 MG tablet  Commonly known as:  CRESTOR  Take 40 mg by mouth at bedtime.     sertraline 100 MG tablet  Commonly known as:  ZOLOFT  Take 150 mg by mouth every morning.        Disposition and follow-up:  Gary Atkinson was [facility] in stable condition. At the hospital follow up visit please address:  1. Community-Acquired Pneumonia: evaluate for clinical improving. He would like to discuss the benefits of repeat chest ray to confirm resolution of his pneumonia.   2. Coronary Artery Disease: Please evaluate vital signs. Metoprolol was held during hospitalization due to bradycardia up to 29 beats per minute This may be restarted by PCP or cardiologist.   3.  Bradycardia: Please evaluate vital signs at follow-up and address any symptoms.   4. Iron-Deficiency Anemia: Please evaluate CBC on follow-up and risk of constipation given recently starting iron supplementation.  5. Labs / imaging needed at time of follow-up: CBC and basic metabolic panel.   6. Pending labs/ test needing follow-up: None Follow-up Appointments:     Follow-up Information   Follow up with Ky Barban, MD On 09/03/2013. (10:45am)    Specialty:  Internal Medicine   Contact information:   [address] [place] [address] [phone]       Follow up with Thurmon Fair, MD On 10/01/2013. (9 am)    Specialty:  Cardiology   Contact information:   [address] [place] [address] [phone]       Discharge Instructions: Discharge Instructions   Call MD for:  extreme fatigue    Complete by:  As directed      Call MD for:   temperature >100.4    Complete by:  As directed      Diet - low sodium heart healthy    Complete by:  As directed      Increase activity slowly    Complete by:  As directed            Consultations:  cardiology  Procedures Performed:  No results found.  2D Echo on 08/25/2013: Study Conclusions  - Left ventricle: The cavity size was normal. Systolic function was normal. The estimated ejection fraction was in the range of 55% to 60%. Wall motion was normal; there were no regional wall motion abnormalities. - Aortic valve: There was mild regurgitation. - Mitral valve: There was trivial regurgitation. - Left atrium: The atrium was mildly dilated. - Tricuspid valve: There was trivial regurgitation.  Admission HPI:   Chief Complaint: SOB  History of Present Illness: Gary Atkinson is a 43 year old man with history of CAD, MI s/p stent x2 in 2010, HTN, chronic sinusitis presenting with SOB and chest pressure. Tuesday night, he had diaphoresis and chills. The follow day he had sinus pressure, SOB, chest pressure, pleuritic chest pain, and nonproductive cough. He called his ENT that follows his chronic sinusitis and was prescribed Levaquin  x 4 days. He used his albuterol inhaler with minimal relief of his symptoms. He was seen in urgent care today where his chest x-ray showed a left mid lung infiltrate. He denies sick contacts. He reports some decreased appetite, lightheadedness with standing/lying down and bilateral foot swelling. Denies weight loss/gain, nausea/vomiting, abdominal pain, diarrhea, paresthesias, rash.  In the ED he received duonebs, benzonatate, 1g IV cetriaxone, and  IV azithromycin.   Hospital Course by problem list: Principal Problem:   CAP (community acquired pneumonia) Active Problems:   CAD (coronary artery disease), native coronary artery   Bradycardia   Benign essential HTN   Dyslipidemia   Iron deficiency anemia, unspecified   Community-Acquired  Pneumonia: Gary Atkinson presented to the [facility] ED with a 4-day history of shortness of breath and chest pressure. He previously underwent a chest x-ray at an urgent care that revealed a left mid lobe patchy infiltrate, consistent with community-acquired pneumonia, and was treated with Levaquin therapy but his symptoms did not improve thus admission for inpatient treatment with IV antibiotics. Throughout the course of his hospitalization, he remained afebrile and non-tachypneic with normal O2 saturation, normal WBC count, and normal pulmonary exam for 2 days. He continued to experience non-productive cough and pleuritic chest pain. Urine strep antigen and  urine Legionella antigen were negative. He was initially treated with IV ceftriaxone and azithromycin and was transitioned to PO Augmentin and azithromycin for a total of 7 days of treatment. For cough and wheezing, he was given Robitussin and albuterol. He will [facility]-up in [facility] on 9/2 at 10:45am with Dr. Garald Braver. During his outpatient visit repeating a chest X-ray in 6 weeks can be considered as patient was concerned about persistence of patchy infiltrate at [place] on his initial x-ray.   Coronary Artery Disease: He had experienced chest pressure throughout his hospitalization that was described as pain in his lung accompanying deep inspiration, likely due to his pneumonia. Given his extensive history of coronary-artery disease status post 2 MIs with the first one at age 34 and multiple stents, there was concern for the involvement of a cardiac process. ECGs were normal, troponins were negative x3, telemetry showed asymptomatic episodes of bradycardia, and echocardiogram showed EF of 55-60% and mild aortic valve regurgitation and mildly dilated left atrium. Cardiology was consulted and found the chest pain to be likely caused by his pneumonia. Records were obtained about his last cardiac catheterization in February 2015 at [facility], which showed a widely patent stent in the proximal LAD and normal left ventricular function. He will establish care on September 30th at 9am with Dr. Royann Shivers at [facility], as he recently moved to [place] from [place].   Asymptomatic Bradycardia: He had been experiencing asymptomatic episodes of bradycardia down to the 20s during sleep. A cardiology consult recommended holding his metoprolol until follow-up with his PCP. He was setup for PCP and cardiology follow-up, as noted above.   Iron-Deficiency Anemia: His hemoglobin was found to be 9.5, with MCV at 79.3 and low iron. He is currently on dual antiplatelet therapy with aspirin and Plavix, but there was no evidence of GI or any other source of bleeding. He was started on ferrous sulfate  TID and colace 100 mg daily PRN for constipation. He will need followup with CBC as outpatient.  Hypokalemia: During this hospitalization, he was found to have mild hypokalemia at 3.5, which resolved with potassium supplementation. His potassium at discharge was 4.0.  Discharge Vitals:   BP 121/67  Pulse 49  Temp(Src) 97.7 F (36.5 C) (Oral)  Resp 18  Ht 6' (1.829 m)  Wt 183 lb 12.8 oz (83.371 kg)  BMI 24.92 kg/m2  SpO2 95%  Discharge Labs:  Results for orders placed during the hospital encounter of 08/24/13 (from the past 24 hour(s))  VITAMIN B12     Status: None   Collection Time    08/25/13 11:49 AM      Result Value Ref Range   Vitamin B-12 842  211 - 911 pg/mL  FOLATE     Status: None   Collection Time    08/25/13 11:49 AM      Result Value Ref Range   Folate 15.0    IRON AND TIBC     Status: Abnormal   Collection Time    08/25/13 11:49 AM      Result Value Ref Range   Iron 32 (*) 42 - 135 ug/dL   TIBC 161  096 - 045 ug/dL   Saturation Ratios 8 (*) 20 - 55 %   UIBC 354  125 - 400 ug/dL  FERRITIN     Status: None   Collection Time    08/25/13 11:49 AM      Result Value Ref  Range     Ferritin 37  22 - 322 ng/mL  RETICULOCYTES     Status: Abnormal   Collection Time    08/25/13 11:49 AM      Result Value Ref Range   Retic Ct Pct 1.8  0.4 - 3.1 %   RBC. 3.72 (*) 4.22 - 5.81 MIL/uL   Retic Count, Manual 67.0  19.0 - 186.0 K/uL  BASIC METABOLIC PANEL     Status: Abnormal   Collection Time    08/26/13  4:31 AM      Result Value Ref Range   Sodium 139  137 - 147 mEq/L   Potassium 4.0  3.7 - 5.3 mEq/L   Chloride 101  96 - 112 mEq/L   CO2 27  19 - 32 mEq/L   Glucose, Bld 121 (*) 70 - 99 mg/dL   BUN 8  6 - 23 mg/dL   Creatinine, Ser 1.61  0.50 - 1.35 mg/dL   Calcium 9.0  8.4 - 09.6 mg/dL   GFR calc non Af Amer >90  >90 mL/min   GFR calc Af Amer >90  >90 mL/min   Anion gap 11  5 - 15    Signed: Dow Adolph, MD 08/26/2013, 10:22 AM    Services Ordered on Discharge: none Equipment Ordered on Discharge: none

## 2013-08-26 NOTE — Progress Notes (Signed)
Discharged home with med list and discharge instruction.

## 2013-08-26 NOTE — Progress Notes (Signed)
Subjective: Still reports bilateral chest pain, which he attributes to coughing.  No new symptoms  Interval events Tele>> still reveals asymptomatic Bradycardia lowest 35 He completed 2 days of IV Rocephin and iv azithromycin. Yesterday , started on by mouth antibiotics with amoxicillin and azithromycin. This is day 3, total antibiotic treatment.   Objective: Vital signs in last 24 hours: Filed Vitals:   08/25/13 1106 08/25/13 1834 08/25/13 2158 08/26/13 0601  BP: 111/62 108/61 134/74 121/67  Pulse: 48 59 53 49  Temp:  98.3 F (36.8 C) 98.1 F (36.7 C) 97.7 F (36.5 C)  TempSrc:  Oral Oral Oral  Resp:  Height:      Weight:    183 lb 12.8 oz (83.371 kg)  SpO2:  100% 98% 95%   Weight change: -1 lb 3.2 oz (-0.544 kg)  Intake/Output Summary (Last 24 hours) at 08/26/13 0908 Last data filed at 08/26/13 0600  Gross per 24 hour  Intake    240 ml  Output    675 ml  Net   -435 ml    General: Vital signs reviewed. No distress.  Lungs: Clear to auscultation bilaterally  Heart: RRR; no extra sounds or murmurs  Abdomen: Bowel sounds present, soft, nontender; no hepatosplenomegaly  Extremities: No bilateral ankle edema  Neurologic: Alert and oriented x3. Moves all extremities  Lab Results: Basic Metabolic Panel:  Recent Labs Lab 08/24/13 1525 08/25/13 0407 08/26/13 0431  NA 139  --  139  K 3.5*  --  4.0  CL 99  --  101  CO2 27  --  27  GLUCOSE 99  --  121*  BUN 10  --  8  CREATININE 0.83  --  0.94  CALCIUM 9.1  --  9.0  MG  --  1.9  --    Liver Function Tests:  Recent Labs Lab 08/24/13 1525  AST 25  ALT 28  ALKPHOS 67  BILITOT 0.5  PROT 7.4  ALBUMIN 3.5   CBC:  Recent Labs Lab 08/24/13 1525 08/24/13 2215  WBC 6.6 5.0  NEUTROABS 4.7  --   HGB 11.1* 9.5*  HCT 32.8* 28.0*  MCV 79.4 79.3  PLT 225 184   Cardiac Enzymes:  Recent Labs Lab 08/24/13 2215 08/25/13 0407  TROPONINI <0.30 <0.30   Micro Results: No results found for this or  any previous visit (from the past 240 hour(s)). Studies/Results: No results found. Medications: I have reviewed the patient's current medications. Scheduled Meds: . amoxicillin  500 mg Oral 3 times per day  . aspirin  81 mg Oral QHS  . atorvastatin  80 mg Oral q1800  . azithromycin  500 mg Oral Daily  . clopidogrel  75 mg Oral QHS  . enoxaparin (LOVENOX) injection  40 mg Subcutaneous Q24H  . ferrous sulfate  325 mg Oral TID WC  . loratadine  10 mg Oral QPM  . montelukast  10 mg Oral QHS  . oxymetazoline  1 spray Each Nare Daily  . pantoprazole  40 mg Oral BID  . potassium chloride  40 mEq Oral Daily  . ramipril  10 mg Oral Daily  . sertraline  150 mg Oral q morning - 10a   Continuous Infusions:  PRN Meds:.ALPRAZolam, docusate sodium, guaiFENesin-dextromethorphan Assessment/Plan: Principal Problem:   CAP (community acquired pneumonia) Active Problems:   CAD (coronary artery disease), native coronary artery   Bradycardia   Benign essential HTN   Dyslipidemia   Iron deficiency anemia, unspecified  CAP: Still complaining of chest pain, are apparent, likely due to pleurisy. Somehow improved since admission. Chest x-ray performed from urgent care revealed a left mid lobe patchy infiltrates. He has been afebrile since admission. Failed outpatient Levaquin.  Plan. - Completed 2/7 days of IV Rocephin and IV azithromycin. - On 08/26/2013 started on by mouth amoxicillin and by mouth azithromycin. This is day 3/7 of total antibiotic treatment. - Will be discharged today on by mouth antibiotics to complete 7 days total treatment time - He will followup with outpatient internal medicine clinic in about one week's time for reevaluation. - May consider recheck chest x-ray in 6 weeks for clearance of infiltrates in the left mid lobe.  CAD: patient has an extensive past medical history of coronary artery disease with 2 MIs the first one being at age 1. He reports that he sees a cardiologist  at [facility] Most recently in March 2015, he had left heart catheterization > we have not been able to obtain documentation of this however, he reports that it was unremarkable with his stents being open/patent. Has been evaluated by cardiology, who did not recommend further work up. His echocardiogram performed on 08/25/2013 was normal with EF of 60-65%. No wall motion abnormalities were noted.  Plan. -Keep on telemetry. - Patient interested in establishing outpatient Cardiology followup in [place], [place]. Will arrange for him. -Repeat EKG with chest pain. -Will try to obtain records from Dr. Mendel Corning office.  Asymptomatic Bradycardia: Telemetry continues to reveal heart rates of as low as 30-40 beats per minute. He remains asymptomatic. He is also on metoprolol 100 mg XL which we are holding. Plan  - hold metoprolol 100 mg daily, may restart with a small dose of outpatient. - Monitor and replace electrolytes as needed - Keep on telemetry  Iron deficiency anemia Anemia: hemoglobin 9.5. No known baseline. MCV is low normal at 79.3. Anemia panel reveals low iron level and low normal ferritin levels. Patient on dual antiplatelet therapy with aspirin and Plavix. Does not endorse GI bleeding symptoms. Plan. - Supplement with by mouth iron  FEN: Heart healthy diet   DVT ppx: Lovenox  daily   Dispo: Disposition is deferred at this time, awaiting improvement of current medical problems. I anticipate he will be discharged today after cardiology evaluation.  The patient does not have a current PCP (No primary provider on file.) and does not need an [facility] hospital follow-up appointment after discharge.  The patient does not know have transportation limitations that hinder transportation to clinic appointments.     LOS: 2 days   Dow Adolph PGY 3 - Internal Medicine Teaching Service Pager: [phone] 08/26/2013, 9:08 AM

## 2013-08-26 NOTE — Progress Notes (Signed)
Subjective: Pt has been doing well overall. His chest pain feels intrapleural, and he endorses pain with deep inspiration. Pt does not believe that the chest pain is cardiac in origin. Overnight, he had episodes of asymptomatic bradycardia down to 20s while sleeping, as found on telemtry. He had recently moved to [facility] and was hoping to establish care nearby.  Objective: Vital signs in last 24 hours: Filed Vitals:   08/25/13 1106 08/25/13 1834 08/25/13 2158 08/26/13 0601  BP: 111/62 108/61 134/74 121/67  Pulse: 48 59 53 49  Temp:  98.3 F (36.8 C) 98.1 F (36.7 C) 97.7 F (36.5 C)  TempSrc:  Oral Oral Oral  Resp:  Height:      Weight:    83.371 kg (183 lb 12.8 oz)  SpO2:  100% 98% 95%   Weight change: -0.544 kg (-1 lb 3.2 oz)  Intake/Output Summary (Last 24 hours) at 08/26/13 1022 Last data filed at 08/26/13 0900  Gross per 24 hour  Intake    360 ml  Output    675 ml  Net   -315 ml   General appearance: alert, cooperative and no distress  Lungs: clear to auscultation bilaterally, no wheezes, crackles or dullness  Heart: bradycardic and regular rhythm, S1, S2 normal, no murmur, click, rub or gallop  Abdomen: soft, non-tender; bowel sounds normal; no masses, no organomegaly  Extremities: extremities normal, atraumatic, no cyanosis or edema  Pulses: 2+ and symmetric  Lab Results: Basic Metabolic Panel:  Recent Labs Lab 08/24/13 1525 08/25/13 0407 08/26/13 0431  NA 139  --  139  K 3.5*  --  4.0  CL 99  --  101  CO2 27  --  27  GLUCOSE 99  --  121*  BUN 10  --  8  CREATININE 0.83  --  0.94  CALCIUM 9.1  --  9.0  MG  --  1.9  --    Liver Function Tests:  Recent Labs Lab 08/24/13 1525  AST 25  ALT 28  ALKPHOS 67  BILITOT 0.5  PROT 7.4  ALBUMIN 3.5   CBC:  Recent Labs Lab 08/24/13 1525 08/24/13 2215  WBC 6.6 5.0  NEUTROABS 4.7  --   HGB 11.1* 9.5*  HCT 32.8* 28.0*  MCV 79.4 79.3  PLT 225 184   Cardiac Enzymes:  Recent Labs Lab  08/24/13 2215 08/25/13 0407  TROPONINI <0.30 <0.30   Anemia Panel:  Recent Labs Lab 08/25/13 1149  VITAMINB12 842  FOLATE 15.0  FERRITIN 37  TIBC 386  IRON 32*  RETICCTPCT 1.8   Medications:  Scheduled Meds: . amoxicillin  500 mg Oral 3 times per day  . aspirin  81 mg Oral QHS  . atorvastatin  80 mg Oral q1800  . azithromycin  500 mg Oral Daily  . clopidogrel  75 mg Oral QHS  . enoxaparin (LOVENOX) injection  40 mg Subcutaneous Q24H  . ferrous sulfate  325 mg Oral TID WC  . loratadine  10 mg Oral QPM  . montelukast  10 mg Oral QHS  . oxymetazoline  1 spray Each Nare Daily  . pantoprazole  40 mg Oral BID  . potassium chloride  40 mEq Oral Daily  . ramipril  10 mg Oral Daily  . sertraline  150 mg Oral q morning - 10a   Continuous Infusions:  PRN Meds:.ALPRAZolam, docusate sodium, guaiFENesin-dextromethorphan  Assessment/Plan: Principal Problem:   CAP (community acquired pneumonia) Active Problems:   CAD (coronary artery  disease), native coronary artery   Bradycardia   Benign essential HTN   Dyslipidemia   Iron deficiency anemia, unspecified  Gary Atkinson is a 43 year old man with history of CAD, MI s/p stent x2 in 2010, HTN, chronic sinusitis who presented with CAP.   Community-Aquired Pneumonia: Chest x-ray performed at urgent care revealed a left mid lobe patchy infiltrate, consistent with CAP. This has persisted through outpatient Levaquin therapy. Afebrile since admission, WBC 5.0, normal RR and O2 sat, and unrevealing pulmonary exam for 2 days. He continues to endorse cough and pleuritic chest pain. Urine strep antigen negative and urine Legionella antigen pending. Also considered bronchitis.  Plan.  - Switch to amoxillin 500 mg PO TID and azithromycin 500 mg PO daily (treatment day 3 of 7)   - F/u urine Legionella antigen and sputum culture  - Robitussin DM Q4hr PRN for cough and albuterol neb  Q2hr PRN for wheezing/shortness of breath  - Discharge today -  Hospital follow-up with [facility] IM clinic on 9/2 at 10:45am with Dr. Garald Braver  CAD: Pt has an extensive history of CAD s/p 2 MIs with first one at age 60 and multiple stents. Last cardiac catheterization was in March 2015, which was unremarkable. Chest pressure is most likely related to underlying PNA. ECG unremarkable and troponins negative x3. Telemetry showed episodes of bradycardia down to 25. Echo showed EF of 55-60%. Plan.  - Cards consulted, no further recs  - Obtain records from Dr. Evalyn Casco office  - Hold metoprolol until cardiology follow-up  - Cardiology follow-up on September 30th at 9am with Dr. Royann Shivers  Bradycardia: Pt had asymptomatic episodes of bradycardia in 30s during sleep, confirmed on telemetry. Cardiologist stated that it may take a few days for metoprolol to wash out Plan  - Cards consulted, recommended holding metoprolol until cardiology follow-up - Hod metoprolol, until outpatient cardiology followup  Iron-Deficiency Anemia: Hemoglobin down to 9.5 yesterday, with MCV at 79.3 and low iron. He is currently on dual antiplatelet therapy with aspirin and Plavix and denies GI bleeding.  Plan.  - Ferrous sulfate  TID + colace 100 mg daily PRN for constipation  Hypokalemia: Resolved. Potassium is 4.0 today after supplementation.    FEN: Heart healthy diet   DVT ppx: Lovenox  daily   This is a Psychologist, occupational Note.  The care of the patient was discussed with Dr. Zada Girt and the assessment and plan formulated with their assistance.  Please see their attached note for official documentation of the daily encounter.   LOS: 2 days   Foye Clock, Med Student 08/26/2013, 10:22 AM   I have seen the patient and reviewed the daily progress note by Aliene Altes MS IV and discussed the care of the patient with them.  Please see note for my findings, assessment, and plans/additions.   Signed:  Dow Adolph, MD PGY-3 Internal Medicine Teaching Service Pager: [phone]

## 2013-08-26 NOTE — Discharge Instructions (Signed)
Thank you for allowing [place] to be involved in your healthcare while you were hospitalized at [facility].   Please note that there have been changes to your home medications.  --> PLEASE LOOK AT YOUR DISCHARGE MEDICATION LIST FOR DETAILS.  Please be sure to stop taking Metoprolol until you are evaluated by cardiology as outpatient  Please call your PCP if you have any questions or concerns, or any difficulty getting any of your medications.  Please return to the ER if you have worsening of your symptoms or new severe symptoms arise.  Iron Deficiency Anemia Anemia is when you have a low number of healthy red blood cells. It is often caused by too little iron. This is called iron deficiency anemia. It may make you tired and short of breath. HOME CARE   Take iron as told by your doctor.  Take vitamins as told by your doctor.  Eat foods that have iron in them. This includes liver, lean beef, whole-grain bread, eggs, dried fruit, and dark green leafy vegetables. GET HELP RIGHT AWAY IF:  You pass out (faint).  You have chest pain.  You feel sick to your stomach (nauseous) or throw up (vomit).  You get very short of breath with activity.  You are weak.  You have a fast heartbeat.  You start to sweat for no reason.  You become light-headed when getting up from a chair or bed. MAKE SURE YOU:  Understand these instructions.  Will watch your condition.  Will get help right away if you are not doing well or get worse. Document Released: 01/21/2010 Document Revised: 12/24/2012 Document Reviewed: 08/26/2012 [facility] Patient Information 2015 [place], [place]. This information is not intended to replace advice given to you by your health care provider. Make sure you discuss any questions you have with your health care provider. Pneumonia Pneumonia is an infection of the lungs.  CAUSES Pneumonia may be caused by bacteria or a virus. Usually, these infections are caused  by breathing infectious particles into the lungs (respiratory tract). SIGNS AND SYMPTOMS   Cough.  Fever.  Chest pain.  Increased rate of breathing.  Wheezing.  Mucus production. DIAGNOSIS  If you have the common symptoms of pneumonia, your health care provider will typically confirm the diagnosis with a chest X-ray. The X-ray will show an abnormality in the lung (pulmonary infiltrate) if you have pneumonia. Other tests of your blood, urine, or sputum may be done to find the specific cause of your pneumonia. Your health care provider may also do tests (blood gases or pulse oximetry) to see how well your lungs are working. TREATMENT  Some forms of pneumonia may be spread to other people when you cough or sneeze. You may be asked to wear a mask before and during your exam. Pneumonia that is caused by bacteria is treated with antibiotic medicine. Pneumonia that is caused by the influenza virus may be treated with an antiviral medicine. Most other viral infections must run their course. These infections will not respond to antibiotics.  HOME CARE INSTRUCTIONS   Cough suppressants may be used if you are losing too much rest. However, coughing protects you by clearing your lungs. You should avoid using cough suppressants if you can.  Your health care provider may have prescribed medicine if he or she thinks your pneumonia is caused by bacteria or influenza. Finish your medicine even if you start to feel better.  Your health care provider may also prescribe an expectorant. This  loosens the mucus to be coughed up.  Take medicines only as directed by your health care provider.  Do not smoke. Smoking is a common cause of bronchitis and can contribute to pneumonia. If you are a smoker and continue to smoke, your cough may last several weeks after your pneumonia has cleared.  A cold steam vaporizer or humidifier in your room or home may help loosen mucus.  Coughing is often worse at night.  Sleeping in a semi-upright position in a recliner or using a couple pillows under your head will help with this.  Get rest as you feel it is needed. Your body will usually let you know when you need to rest. PREVENTION A pneumococcal shot (vaccine) is available to prevent a common bacterial cause of pneumonia. This is usually suggested for:  People over 40 years old.  Patients on chemotherapy.  People with chronic lung problems, such as bronchitis or emphysema.  People with immune system problems. If you are over 65 or have a high risk condition, you may receive the pneumococcal vaccine if you have not received it before. In some countries, a routine influenza vaccine is also recommended. This vaccine can help prevent some cases of pneumonia.You may be offered the influenza vaccine as part of your care. If you smoke, it is time to quit. You may receive instructions on how to stop smoking. Your health care provider can provide medicines and counseling to help you quit. SEEK MEDICAL CARE IF: You have a fever. SEEK IMMEDIATE MEDICAL CARE IF:   Your illness becomes worse. This is especially true if you are elderly or weakened from any other disease.  You cannot control your cough with suppressants and are losing sleep.  You begin coughing up blood.  You develop pain which is getting worse or is uncontrolled with medicines.  Any of the symptoms which initially brought you in for treatment are getting worse rather than better.  You develop shortness of breath or chest pain. MAKE SURE YOU:   Understand these instructions.  Will watch your condition.  Will get help right away if you are not doing well or get worse. Document Released: 12/19/2004 Document Revised: 05/05/2013 Document Reviewed: 03/10/2010 [facility] Patient Information 2015 [place], [place]. This information is not intended to replace advice given to you by your health care provider. Make sure you discuss any questions you  have with your health care provider.

## 2013-08-26 NOTE — Progress Notes (Signed)
Patient alert and oriented x 3. Lungs clear. No difficulty breathing. Complains that  overall he does not feel any better than he felt when he came in the hospital. Dry cough noted. Continues to feel very tired.also complains of chest tightness. Patient reported the symptoms to PA this morning.

## 2013-08-28 NOTE — ED Provider Notes (Signed)
Medical screening examination/treatment/procedure(s) were conducted as a shared visit with non-physician practitioner(s) and myself.  I personally evaluated the patient during the encounter.   EKG Interpretation   Date/Time:  Sunday August 24 2013 15:19:02 EDT Ventricular Rate:  67 PR Interval:  168 QRS Duration: 94 QT Interval:  390 QTC Calculation: 412 R Axis:   35 Text Interpretation:  Normal sinus rhythm Normal ECG No prior for  comparsion Confirmed by Gwendolyn Grant  MD, Amelya Mabry ([id number]) on 08/24/2013 4:26:35 PM      Patient here with CP. Recent treatment of PNA with levaquin, CXR shows persistent PNA. Admitted since he's failed outpatient levaquin.  Elwin Mocha, MD 08/28/13 [phone]

## 2013-09-03 ENCOUNTER — Ambulatory Visit: Payer: 59 | Admitting: Internal Medicine

## 2013-09-03 ENCOUNTER — Encounter: Payer: Self-pay | Admitting: Internal Medicine

## 2013-10-01 ENCOUNTER — Ambulatory Visit: Payer: 59 | Admitting: Cardiovascular Disease

## 2014-05-21 ENCOUNTER — Other Ambulatory Visit: Payer: Self-pay | Admitting: Internal Medicine

## 2018-11-18 ENCOUNTER — Encounter: Payer: Self-pay | Admitting: Gastroenterology

## 2018-12-10 ENCOUNTER — Telehealth: Payer: Self-pay

## 2018-12-13 ENCOUNTER — Encounter: Payer: Self-pay | Admitting: Gastroenterology

## 2018-12-13 ENCOUNTER — Ambulatory Visit: Payer: Managed Care, Other (non HMO) | Admitting: Gastroenterology

## 2018-12-13 ENCOUNTER — Other Ambulatory Visit (INDEPENDENT_AMBULATORY_CARE_PROVIDER_SITE_OTHER): Payer: Managed Care, Other (non HMO)

## 2018-12-13 ENCOUNTER — Ambulatory Visit (INDEPENDENT_AMBULATORY_CARE_PROVIDER_SITE_OTHER): Payer: Managed Care, Other (non HMO)

## 2018-12-13 VITALS — BP 146/80 | HR 56 | Temp 98.2°F | Ht 68.5 in | Wt 169.2 lb

## 2018-12-13 DIAGNOSIS — R748 Abnormal levels of other serum enzymes: Secondary | ICD-10-CM

## 2018-12-13 DIAGNOSIS — K219 Gastro-esophageal reflux disease without esophagitis: Secondary | ICD-10-CM | POA: Diagnosis not present

## 2018-12-13 DIAGNOSIS — R634 Abnormal weight loss: Secondary | ICD-10-CM | POA: Diagnosis not present

## 2018-12-13 DIAGNOSIS — Z1159 Encounter for screening for other viral diseases: Secondary | ICD-10-CM

## 2018-12-13 DIAGNOSIS — K760 Fatty (change of) liver, not elsewhere classified: Secondary | ICD-10-CM | POA: Diagnosis not present

## 2018-12-13 DIAGNOSIS — D509 Iron deficiency anemia, unspecified: Secondary | ICD-10-CM

## 2018-12-13 LAB — CBC WITH DIFFERENTIAL/PLATELET
Basophils Absolute: 0 10*3/uL (ref 0.0–0.1)
Basophils Relative: 0.7 % (ref 0.0–3.0)
Eosinophils Absolute: 0.1 10*3/uL (ref 0.0–0.7)
Eosinophils Relative: 2.9 % (ref 0.0–5.0)
HCT: 42.5 % (ref 39.0–52.0)
Hemoglobin: 14.4 g/dL (ref 13.0–17.0)
Lymphocytes Relative: 26.7 % (ref 12.0–46.0)
Lymphs Abs: 1.3 10*3/uL (ref 0.7–4.0)
MCHC: 33.9 g/dL (ref 30.0–36.0)
MCV: 89.9 fl (ref 78.0–100.0)
Monocytes Absolute: 0.4 10*3/uL (ref 0.1–1.0)
Monocytes Relative: 8.9 % (ref 3.0–12.0)
Neutro Abs: 3 10*3/uL (ref 1.4–7.7)
Neutrophils Relative %: 60.8 % (ref 43.0–77.0)
Platelets: 241 10*3/uL (ref 150.0–400.0)
RBC: 4.73 Mil/uL (ref 4.22–5.81)
RDW: 13 % (ref 11.5–15.5)
WBC: 5 10*3/uL (ref 4.0–10.5)

## 2018-12-13 LAB — HEPATIC FUNCTION PANEL
ALT: 30 U/L (ref 0–53)
AST: 30 U/L (ref 0–37)
Albumin: 4.5 g/dL (ref 3.5–5.2)
Alkaline Phosphatase: 41 U/L (ref 39–117)
Bilirubin, Direct: 0.2 mg/dL (ref 0.0–0.3)
Total Bilirubin: 0.8 mg/dL (ref 0.2–1.2)
Total Protein: 7.5 g/dL (ref 6.0–8.3)

## 2018-12-13 MED ORDER — SUCRALFATE 1 GM/10ML PO SUSP: 1.0000 g | Freq: Four times a day (QID) | ORAL | 1 refills | Status: DC | PRN

## 2018-12-13 NOTE — Progress Notes (Signed)
HPI :  48 year old male with a history of coronary artery disease with drug-eluting stents in place, history of iron deficiency anemia, GERD, self-referred for further evaluation of reflux and weight loss.  He states he had reflux for the past 20 years or so for which he has required prescription medication to control his symptoms.  He initially was on Protonix for a long period of time, then switched to omeprazole and other PPIs.  Over-the-counter regimens have not worked for him.  He unfortunately was laid off in March from his work and he lost insurance and he lost access to his PPI.  He had to use Pepcid which he was taking 4 times a day which did not provide much benefit to his symptoms.  He has since gotten insurance and was resumed on Protonix, he is currently taking 80 mg twice a day and he states he continues to have severe reflux symptoms that bother him.  He endorses regurgitation and pyrosis as well as a sense of globus.  He denies any dysphagia with his meals but feels like his food just sits in his stomach after he eats.  He denies any nausea or vomiting.  No early satiety but he is eating much less than usual because he is having a hard time eating due to symptoms which are much worse after he eats.  He has lost a significant amount of weight since March due to the symptoms.  He states he previously was 213 pounds and is now 169 pounds.  Weight loss has not helped things symptoms at all.  His bowels are okay he denies any blood in his stools.  He thinks the Protonix does help compared to not taking anything but it certainly has not resolved his symptoms.  He is not taking any blood thinners, only an aspirin 81 mg once a day.  No Plavix.  Review of Careeverywhere - EGD, colonoscopy, capsule endoscopy at [facility] in 03/2014 for iron deficiency anemia - path shows normal duodenal biopsies - no celiac. Report shows no polyps. Capsule was normal  Had cardiac cath in May at [facility] which showed nonflow  limiting coronary artery disease and no intervention was performed.  Echo 05/15/18 - EF 55%  Of note he is also had a chronic elevation in his liver enzymes with a fluctuating lipase level.  He has had transaminitis is in to the mid 100s.  He states is been going on for a long time.  He has had a prior ultrasound in 05/2017 which showed a fatty liver.  He has very seldom alcohol use and has never drank regularly.  He has had a prior negative hepatitis B and C serology but has not had complete work-up for this.  It has been presumed that perhaps his elevated liver enzymes are due to fatty liver when he was more overweight.  Of note when he was admitted to [facility] in May he had a hemoglobin of 10, unclear what was causing his anemia.  In review of labs he has had transaminitis dating back at least to 2015.  In 2018 he had an ALT of 269.  His alkaline phosphatase and bilirubin levels have remained normal.      Past Medical History:  Diagnosis Date  . Allergic rhinitis   . Anxiety   . Asthma   . CAD (coronary artery disease)   . Depression   . GERD (gastroesophageal reflux disease)   . Hyperlipemia   . Hypertension   . MI (  myocardial infarction) ([place])   . Pneumonia      Past Surgical History:  Procedure Laterality Date  . APPENDECTOMY    . CARDIAC CATHETERIZATION     with 2 stents   Family History  Problem Relation Age of Onset  . Liver disease Father   . Alcoholism Father    Social History   Tobacco Use  . Smoking status: Former Smoker    Types: Cigarettes    Quit date: 2002    Years since quitting: 18.9  . Smokeless tobacco: Never Used  Substance Use Topics  . Alcohol use: Yes    Comment: social  . Drug use: Never   Current Outpatient Medications  Medication Sig Dispense Refill  . albuterol (PROVENTIL HFA;VENTOLIN HFA) 108 (90 BASE) MCG/ACT inhaler Inhale 2 puffs into the lungs every 6 (six) hours as needed for wheezing or shortness of breath.    Marland Kitchen aspirin 81 MG tablet  Take 81 mg by mouth at bedtime.    . clonazePAM (KLONOPIN) 1 MG tablet Take 1 tablet by mouth as needed.    . fexofenadine (ALLEGRA) 180 MG tablet Take 1 tablet by mouth daily.    . Fluticasone-Salmeterol (WIXELA INHUB) 250-50 MCG/DOSE AEPB Inhale 1 puff into the lungs as needed.    . hydrOXYzine (ATARAX/VISTARIL) 50 MG tablet Take 1 tablet by mouth as needed.    Marland Kitchen lisinopril (ZESTRIL) 5 MG tablet Take 1 tablet by mouth daily.    . pantoprazole (PROTONIX) 40 MG tablet Take 2 tablets by mouth daily.    . propranolol (INDERAL) 40 MG tablet Take 20 mg by mouth daily.    . rizatriptan (MAXALT) 10 MG tablet Take 1 tablet by mouth as needed.    . topiramate (TOPAMAX) 25 MG tablet Take 1 tablet by mouth as directed.    . nitroGLYCERIN (NITROSTAT) 0.4 MG SL tablet Place 1 tablet under the tongue as needed.     No current facility-administered medications for this visit.   No Known Allergies   Review of Systems: All systems reviewed and negative except where noted in HPI.   Labs per HPI  Physical Exam: BP (!) 146/80 (BP Location: Left Arm, Patient Position: Sitting, Cuff Size: Normal)   Pulse (!) 56   Temp 98.2 F (36.8 C)   Ht 5' 8.5" (1.74 m) Comment: height measured without shoes  Wt 169 lb 4 oz (76.8 kg)   BMI 25.36 kg/m  Constitutional: Pleasant,well-developed, male in no acute distress. HEENT: Normocephalic and atraumatic. Conjunctivae are normal. No scleral icterus. Neck supple.  Cardiovascular: Normal rate, regular rhythm.  Pulmonary/chest: Effort normal and breath sounds normal. No wheezing, rales or rhonchi. Abdominal: Soft, nondistended, nontender.  There are no masses palpable. No hepatomegaly. Extremities: no edema Lymphadenopathy: No cervical adenopathy noted. Neurological: Alert and oriented to person place and time. Skin: Skin is warm and dry. No rashes noted. Psychiatric: Normal mood and affect. Behavior is normal.   ASSESSMENT AND PLAN: 48 year old male here for  new patient assessment of the following:  GERD / weight loss - longstanding reflux symptoms, have significantly worsened in the past several months.  He is on an extremely high dose of Protonix 80 mg twice a day which is not controlling his symptoms.  Due to severe reflux symptoms he has a very poor appetite as eating makes his symptoms worse.  He has had a significant bout of weight loss since the symptoms have been bothering him.  He had an EGD in 2016 at [facility]  which seemed okay, no reported Barrett's esophagus.  I discussed options with him.  He has severe reflux and with his weight loss that is refractory to medical therapy thus far I am recommending an endoscopy.  I discussed risk and benefits of endoscopy and anesthesia with him and he wanted to proceed.  If he is failing medical therapy and is a candidate for endoscopic reflux therapy such as TIF, this may be something he wants to pursue to get off PPI as he has been on chronic PPI for more than 20 years.  We discussed risk and benefits of chronic PPI use and he agreed if he can come off the regimen he would like to.  While we are waiting for his endoscopy I will give him a sample of Dexilant 60 mg once a day and a trial of liquid Carafate 10 cc every 6 hours as needed.  Further recommendations pending endoscopy results and his course.    Anemia - remote history of iron deficiency, he had recurrence of anemia [facility].  Given his weight loss I will recheck his blood counts to ensure stable.  His colonoscopy is [facility] at [facility] and was normal.  Prior capsule endoscopy for iron deficiency also negative.  No clear cause noted for his prior iron deficiency.  Fatty liver / Elevated liver enzymes - he has had a significant ALT elevation to the 100s to mid 200s over the past few years, potentially due to fatty liver however he is not had a full serologic work-up for this to exclude other chronic liver diseases.  I discussed differential diagnosis  with him.  Going to repeat his liver enzymes now and send the rest of his serologies to check for autoimmune hepatitis, etc. and to assess his immunity to hepatitis a and B and vaccinate if needed.  If his liver enzyme elevation is due to fatty liver, his weight loss may have significantly help this and will see if his enzymes are improved at all.  He agreed  Ileene PatrickSteven Jarelly Rinck, MD [facility]

## 2018-12-13 NOTE — Patient Instructions (Signed)
If you are age 50 or older, your body mass index should be between 23-30. Your Body mass index is 25.36 kg/m. If this is out of the aforementioned range listed, please consider follow up with your Primary Care Provider.  If you are age 82 or younger, your body mass index should be between 19-25. Your Body mass index is 25.36 kg/m. If this is out of the aformentioned range listed, please consider follow up with your Primary Care Provider.   You have been scheduled for an endoscopy. Please follow written instructions given to you at your visit today. If you use inhalers (even only as needed), please bring them with you on the day of your procedure.  Your provider has requested that you go to the basement level for lab work before leaving today. Press "B" on the elevator. The lab is located at the first door on the left as you exit the elevator.  Medication Samples have been provided to the patient.  Drug name: Dexilant       Strength: 60mg         Qty: 10  LOT: [id number]  Exp.Date: 07-2020  Dosing instructions: one a day   It was a pleasure to see you today!  Dr. Havery Moros

## 2018-12-16 LAB — ALPHA-1-ANTITRYPSIN: A-1 Antitrypsin, Ser: 143 mg/dL (ref 83–199)

## 2018-12-16 LAB — ANTI-SMOOTH MUSCLE ANTIBODY, IGG: Actin (Smooth Muscle) Antibody (IGG): <20 U (ref <20)

## 2018-12-16 LAB — SARS CORONAVIRUS 2 (TAT 6-24 HRS): SARS Coronavirus 2: NEGATIVE

## 2018-12-16 LAB — HEPATITIS A ANTIBODY, IGM: Hep A IgM: NONREACTIVE

## 2018-12-16 LAB — IGG: IgG (Immunoglobin G), Serum: 884 mg/dL (ref 600–1640)

## 2018-12-16 LAB — ANA: Anti Nuclear Antibody (ANA): NEGATIVE

## 2018-12-16 LAB — HEPATITIS B SURFACE ANTIBODY,QUALITATIVE: Hep B S Ab: NONREACTIVE

## 2018-12-17 ENCOUNTER — Other Ambulatory Visit: Payer: Self-pay

## 2018-12-17 ENCOUNTER — Encounter: Payer: Self-pay | Admitting: Gastroenterology

## 2018-12-17 ENCOUNTER — Ambulatory Visit (AMBULATORY_SURGERY_CENTER): Payer: Managed Care, Other (non HMO) | Admitting: Gastroenterology

## 2018-12-17 VITALS — BP 111/52 | HR 50 | Temp 97.8°F | Resp 11 | Ht 68.5 in | Wt 169.0 lb

## 2018-12-17 DIAGNOSIS — K3189 Other diseases of stomach and duodenum: Secondary | ICD-10-CM

## 2018-12-17 DIAGNOSIS — R630 Anorexia: Secondary | ICD-10-CM

## 2018-12-17 DIAGNOSIS — K449 Diaphragmatic hernia without obstruction or gangrene: Secondary | ICD-10-CM

## 2018-12-17 DIAGNOSIS — R634 Abnormal weight loss: Secondary | ICD-10-CM

## 2018-12-17 DIAGNOSIS — K219 Gastro-esophageal reflux disease without esophagitis: Secondary | ICD-10-CM

## 2018-12-17 MED ORDER — SODIUM CHLORIDE 0.9 % IV SOLN: 500.0000 mL | Freq: Once | INTRAVENOUS | Status: DC

## 2018-12-17 NOTE — Progress Notes (Signed)
Called to room to assist during endoscopic procedure.  Patient ID and intended procedure confirmed with present staff. Received instructions for my participation in the procedure from the performing physician.  

## 2018-12-17 NOTE — Op Note (Signed)
[facility] Patient Name: Gary Atkinson Procedure Date: 12/17/2018 12:34 PM MRN: [id number] Endoscopist: Viviann Spare P. Adela Lank , MD Age: 48 Referring MD:  Date of Birth: [birth year] Gender: Male Account #: [id number] Procedure:                Upper GI endoscopy Indications:              Esophageal reflux symptoms that persist despite                            appropriate therapy, Weight loss, poor appetite -                            symptoms persist despite high dose protonix, now                            switched to Dexilant / carafate, not much                            improvement Medicines:                Monitored Anesthesia Care Procedure:                Pre-Anesthesia Assessment:                           - Prior to the procedure, a History and Physical                            was performed, and patient medications and                            allergies were reviewed. The patient's tolerance of                            previous anesthesia was also reviewed. The risks                            and benefits of the procedure and the sedation                            options and risks were discussed with the patient.                            All questions were answered, and informed consent                            was obtained. Prior Anticoagulants: The patient has                            taken no previous anticoagulant or antiplatelet                            agents. ASA Grade Assessment: III - A patient with  severe systemic disease. After reviewing the risks                            and benefits, the patient was deemed in                            satisfactory condition to undergo the procedure.                           After obtaining informed consent, the endoscope was                            passed under direct vision. Throughout the                            procedure, the patient's blood pressure, pulse,  and                            oxygen saturations were monitored continuously. The                            Endoscope was introduced through the mouth, and                            advanced to the second part of duodenum. The upper                            GI endoscopy was accomplished without difficulty.                            The patient tolerated the procedure well. Scope In: Scope Out: Findings:                 Esophagogastric landmarks were identified: the                            Z-line was found at 38 cm, the gastroesophageal                            junction was found at 38 cm and the upper extent of                            the gastric folds was found at 39 cm from the                            incisors.                           A 1 cm hiatal hernia was present.                           The exam of the esophagus was otherwise normal. No  Barrett's esophagus or esophagitis.                           Biopsies were taken with a cold forceps in the                            upper third of the esophagus, in the middle third                            of the esophagus and in the lower third of the                            esophagus for histology.                           Patchy mildly erythematous mucosa was found in the                            gastric body and in the gastric antrum without                            focal ulceration.                           The exam of the stomach was otherwise normal.                            Retroflexed views of the cardia [place] grade I to II                           Biopsies were taken with a cold forceps in the                            gastric body, at the incisura and in the gastric                            antrum for Helicobacter pylori testing.                           The duodenal bulb and second portion of the                            duodenum were normal. Complications:             No immediate complications. Estimated blood loss:                            Minimal. Estimated Blood Loss:     Estimated blood loss was minimal. Impression:               - Esophagogastric landmarks identified.                           - 1 cm hiatal hernia.                           -  Normal esophagus otherwise - biopsies taken to                            rule out eosinophilic esophagitis                           - Erythematous mucosa in the gastric body and                            antrum.                           - Normal stomach otherwise - biopsies taken to rule                            out H pylori                           - Normal duodenal bulb and second portion of the                            duodenum.                           No erosive changes noted, the patient has                            non-erosive reflux disease ([place]) Recommendation:           - Patient has a contact number available for                            emergencies. The signs and symptoms of potential                            delayed complications were discussed with the                            patient. Return to normal activities tomorrow.                            Written discharge instructions were provided to the                            patient.                           - Resume previous diet.                           - Continue present medications.                           - Await pathology results.                           - Consideration for barium swallow / pH study to  document objective evidence of reflux, patient is a                            candidate for TIF if symptoms are related to GERD [name], MD 12/17/2018 12:57:16 PM This report has been signed electronically.

## 2018-12-17 NOTE — Patient Instructions (Signed)
YOU HAD AN ENDOSCOPIC PROCEDURE TODAY AT THE [facility]:   Refer to the procedure report that was given to you for any specific questions about what was found during the examination.  If the procedure report does not answer your questions, please call your gastroenterologist to clarify.  If you requested that your care partner not be given the details of your procedure findings, then the procedure report has been included in a sealed envelope for you to review at your convenience later.  YOU SHOULD EXPECT: Some feelings of bloating in the abdomen. Passage of more gas than usual.  Walking can help get rid of the air that was put into your GI tract during the procedure and reduce the bloating. If you had a lower endoscopy (such as a colonoscopy or flexible sigmoidoscopy) you may notice spotting of blood in your stool or on the toilet paper. If you underwent a bowel prep for your procedure, you may not have a normal bowel movement for a few days.  Please Note:  You might notice some irritation and congestion in your nose or some drainage.  This is from the oxygen used during your procedure.  There is no need for concern and it should clear up in a day or so.  SYMPTOMS TO REPORT IMMEDIATELY:   Following upper endoscopy (EGD)  Vomiting of blood or coffee ground material  New chest pain or pain under the shoulder blades  Painful or persistently difficult swallowing  New shortness of breath  Fever of 100F or higher  Black, tarry-looking stools  For urgent or emergent issues, a gastroenterologist can be reached at any hour by calling [phone].   DIET:  We do recommend a small meal at first, but then you may proceed to your regular diet.  Drink plenty of fluids but you should avoid alcoholic beverages for 24 hours.  ACTIVITY:  You should plan to take it easy for the rest of today and you should NOT DRIVE or use heavy machinery until tomorrow (because of the sedation medicines used  during the test).    FOLLOW UP: Our staff will call the number listed on your records 48-72 hours following your procedure to check on you and address any questions or concerns that you may have regarding the information given to you following your procedure. If we do not reach you, we will leave a message.  We will attempt to reach you two times.  During this call, we will ask if you have developed any symptoms of COVID 19. If you develop any symptoms (ie: fever, flu-like symptoms, shortness of breath, cough etc.) before then, please call [phone].  If you test positive for Covid 19 in the 2 weeks post procedure, please call and report this information to [place].    If any biopsies were taken you will be contacted by phone or by letter within the next 1-3 weeks.  Please call us at [phone] if you have not heard about the biopsies in 3 weeks.    SIGNATURES/CONFIDENTIALITY: You and/or your care partner have signed paperwork which will be entered into your electronic medical record.  These signatures attest to the fact that that the information above on your After Visit Summary has been reviewed and is understood.  Full responsibility of the confidentiality of this discharge information lies with you and/or your care-partner.    Handouts were given to you on a hiatal hernia and gastritis. You may resume your current medications today. Await biopsy  results. Consider for Barium swallow, pH study to document objective evidence of reflux.  To see if you are a candidate for Transoral Incisiondess Funoplication. Please call if any questions or concerns.

## 2018-12-17 NOTE — Progress Notes (Signed)
Temp by LC and vitals by DT. 

## 2018-12-17 NOTE — Progress Notes (Signed)
No problems noted in the recovery room. maw 

## 2018-12-17 NOTE — Progress Notes (Signed)
Report given to PACU, vss 

## 2018-12-18 ENCOUNTER — Other Ambulatory Visit: Payer: Self-pay

## 2018-12-19 ENCOUNTER — Telehealth: Payer: Self-pay

## 2018-12-19 ENCOUNTER — Telehealth: Payer: Self-pay | Admitting: *Deleted

## 2018-12-19 NOTE — Telephone Encounter (Signed)
Left message on 2nd follow up call. 

## 2018-12-19 NOTE — Telephone Encounter (Signed)
First follow up call attempt.  LVM. 

## 2018-12-19 NOTE — Telephone Encounter (Signed)
Called patient back, got voice mail. Left a message to please call back

## 2018-12-23 ENCOUNTER — Telehealth: Payer: Self-pay

## 2018-12-23 NOTE — Telephone Encounter (Signed)
Left message for patient to please call back for schedule a CT

## 2018-12-25 ENCOUNTER — Other Ambulatory Visit: Payer: Self-pay

## 2018-12-25 DIAGNOSIS — R630 Anorexia: Secondary | ICD-10-CM

## 2018-12-25 DIAGNOSIS — R634 Abnormal weight loss: Secondary | ICD-10-CM

## 2018-12-30 ENCOUNTER — Other Ambulatory Visit: Payer: Self-pay

## 2019-01-02 NOTE — Telephone Encounter (Signed)
Good Morning,   Amy is off this week. I have called patient and left a detailed voicemail we do not have prices for these diagnostic procedures so we cannot tell him how much they are. I did advise patient he would more than likely be paying his insurance deductible before anything dx is covered. I left our office phone number for him to call back if he has any further questions.  Thank you, Lorriane Shire

## 2019-01-07 ENCOUNTER — Other Ambulatory Visit: Payer: Self-pay

## 2019-01-07 ENCOUNTER — Telehealth: Payer: Self-pay

## 2019-01-07 DIAGNOSIS — K219 Gastro-esophageal reflux disease without esophagitis: Secondary | ICD-10-CM

## 2019-01-07 MED ORDER — ESOMEPRAZOLE MAGNESIUM 40 MG PO PACK: 40.0000 mg | PACK | Freq: Two times a day (BID) | ORAL | 1 refills | Status: DC

## 2019-01-07 NOTE — Telephone Encounter (Signed)
Spoke to patient and he agreed to try Nexium. Sent.. order to his pharmacy and gave instructions for taking. Patient will come pick-up the [employer]-Pay card at the front desk, for Dexilant. He is good with the Barium Swallow Study appt. On 01/17/19

## 2019-01-07 NOTE — Telephone Encounter (Signed)
Called and left message for patient to please call back,  that Dr. Adela Lank has  a couple of alternative medications he can  try instead of Protonix

## 2019-01-07 NOTE — Telephone Encounter (Signed)
Gary Atkinson can you let this patient know I am happy to give him whatever regimen he wishes to have. He has been on multiple PPIs in the past. He may want to call his insurance company to see what their preferred regimen is. Omeprazole 40mg  twice daily would likely be covered but I think he has been on that in the past and not sure how well it has worked. We could otherwise try Nexium 40mg  twice daily, not sure the coverage on that. If we have any more free samples of Dexilant at 60mg  we could also give that to him. Thanks

## 2019-01-07 NOTE — Telephone Encounter (Signed)
Patient wants to know if there is another medication he can try instead of Protonix. States his Ins. Doesn't cover it.

## 2019-01-07 NOTE — Telephone Encounter (Signed)
Pt returned your call regarding this msg

## 2019-01-09 ENCOUNTER — Other Ambulatory Visit: Payer: Self-pay

## 2019-01-09 MED ORDER — DEXLANSOPRAZOLE 60 MG PO CPDR: 60.0000 mg | DELAYED_RELEASE_CAPSULE | Freq: Every day | ORAL | 1 refills | Status: DC

## 2019-01-17 ENCOUNTER — Other Ambulatory Visit: Payer: Self-pay

## 2019-01-17 ENCOUNTER — Ambulatory Visit (HOSPITAL_COMMUNITY)
Admission: RE | Admit: 2019-01-17 | Discharge: 2019-01-17 | Disposition: A | Discharge status: Home / Self Care | Payer: Managed Care, Other (non HMO) | Source: Ambulatory Visit | Attending: Gastroenterology | Admitting: Gastroenterology

## 2019-01-17 DIAGNOSIS — K219 Gastro-esophageal reflux disease without esophagitis: Secondary | ICD-10-CM | POA: Insufficient documentation

## 2019-01-20 ENCOUNTER — Other Ambulatory Visit: Payer: Self-pay

## 2019-01-20 DIAGNOSIS — R634 Abnormal weight loss: Secondary | ICD-10-CM

## 2019-01-20 MED ORDER — ONDANSETRON 4 MG PO TBDP: 4.0000 mg | ORAL_TABLET | Freq: Three times a day (TID) | ORAL | 1 refills | Status: DC | PRN

## 2019-01-29 ENCOUNTER — Encounter (HOSPITAL_COMMUNITY): Payer: Self-pay

## 2019-01-29 ENCOUNTER — Ambulatory Visit (HOSPITAL_COMMUNITY): Admission: RE | Admit: 2019-01-29 | Payer: Managed Care, Other (non HMO) | Source: Ambulatory Visit

## 2019-07-23 ENCOUNTER — Inpatient Hospital Stay (HOSPITAL_BASED_OUTPATIENT_CLINIC_OR_DEPARTMENT_OTHER)
Admission: EM | Admit: 2019-07-23 | Discharge: 2019-08-06 | DRG: 439 | Disposition: A | Discharge status: Home / Self Care | Payer: 59 | Attending: Internal Medicine | Admitting: Internal Medicine

## 2019-07-23 ENCOUNTER — Emergency Department (HOSPITAL_BASED_OUTPATIENT_CLINIC_OR_DEPARTMENT_OTHER): Payer: 59

## 2019-07-23 ENCOUNTER — Encounter (HOSPITAL_BASED_OUTPATIENT_CLINIC_OR_DEPARTMENT_OTHER): Payer: Self-pay

## 2019-07-23 ENCOUNTER — Other Ambulatory Visit: Payer: Self-pay

## 2019-07-23 DIAGNOSIS — K859 Acute pancreatitis without necrosis or infection, unspecified: Secondary | ICD-10-CM

## 2019-07-23 DIAGNOSIS — I252 Old myocardial infarction: Secondary | ICD-10-CM

## 2019-07-23 DIAGNOSIS — D696 Thrombocytopenia, unspecified: Secondary | ICD-10-CM | POA: Diagnosis present

## 2019-07-23 DIAGNOSIS — R109 Unspecified abdominal pain: Secondary | ICD-10-CM | POA: Diagnosis present

## 2019-07-23 DIAGNOSIS — R112 Nausea with vomiting, unspecified: Secondary | ICD-10-CM | POA: Diagnosis present

## 2019-07-23 DIAGNOSIS — Z955 Presence of coronary angioplasty implant and graft: Secondary | ICD-10-CM

## 2019-07-23 DIAGNOSIS — Z79899 Other long term (current) drug therapy: Secondary | ICD-10-CM

## 2019-07-23 DIAGNOSIS — K449 Diaphragmatic hernia without obstruction or gangrene: Secondary | ICD-10-CM

## 2019-07-23 DIAGNOSIS — F319 Bipolar disorder, unspecified: Secondary | ICD-10-CM | POA: Diagnosis present

## 2019-07-23 DIAGNOSIS — Z560 Unemployment, unspecified: Secondary | ICD-10-CM

## 2019-07-23 DIAGNOSIS — F322 Major depressive disorder, single episode, severe without psychotic features: Secondary | ICD-10-CM

## 2019-07-23 DIAGNOSIS — J90 Pleural effusion, not elsewhere classified: Secondary | ICD-10-CM

## 2019-07-23 DIAGNOSIS — Z20822 Contact with and (suspected) exposure to covid-19: Secondary | ICD-10-CM | POA: Diagnosis present

## 2019-07-23 DIAGNOSIS — R7989 Other specified abnormal findings of blood chemistry: Secondary | ICD-10-CM | POA: Diagnosis present

## 2019-07-23 DIAGNOSIS — Z811 Family history of alcohol abuse and dependence: Secondary | ICD-10-CM

## 2019-07-23 DIAGNOSIS — K219 Gastro-esophageal reflux disease without esophagitis: Secondary | ICD-10-CM | POA: Diagnosis present

## 2019-07-23 DIAGNOSIS — E876 Hypokalemia: Secondary | ICD-10-CM

## 2019-07-23 DIAGNOSIS — Z7982 Long term (current) use of aspirin: Secondary | ICD-10-CM

## 2019-07-23 DIAGNOSIS — K8689 Other specified diseases of pancreas: Secondary | ICD-10-CM | POA: Diagnosis not present

## 2019-07-23 DIAGNOSIS — I248 Other forms of acute ischemic heart disease: Secondary | ICD-10-CM | POA: Diagnosis present

## 2019-07-23 DIAGNOSIS — E871 Hypo-osmolality and hyponatremia: Secondary | ICD-10-CM | POA: Diagnosis not present

## 2019-07-23 DIAGNOSIS — I10 Essential (primary) hypertension: Secondary | ICD-10-CM | POA: Diagnosis present

## 2019-07-23 DIAGNOSIS — K8521 Alcohol induced acute pancreatitis with uninfected necrosis: Principal | ICD-10-CM | POA: Diagnosis present

## 2019-07-23 DIAGNOSIS — F17200 Nicotine dependence, unspecified, uncomplicated: Secondary | ICD-10-CM | POA: Diagnosis present

## 2019-07-23 DIAGNOSIS — R0989 Other specified symptoms and signs involving the circulatory and respiratory systems: Secondary | ICD-10-CM

## 2019-07-23 DIAGNOSIS — I2510 Atherosclerotic heart disease of native coronary artery without angina pectoris: Secondary | ICD-10-CM | POA: Diagnosis present

## 2019-07-23 DIAGNOSIS — J9811 Atelectasis: Secondary | ICD-10-CM | POA: Diagnosis not present

## 2019-07-23 DIAGNOSIS — D6489 Other specified anemias: Secondary | ICD-10-CM | POA: Diagnosis present

## 2019-07-23 DIAGNOSIS — E785 Hyperlipidemia, unspecified: Secondary | ICD-10-CM | POA: Diagnosis present

## 2019-07-23 DIAGNOSIS — K760 Fatty (change of) liver, not elsewhere classified: Secondary | ICD-10-CM | POA: Diagnosis present

## 2019-07-23 DIAGNOSIS — R52 Pain, unspecified: Secondary | ICD-10-CM

## 2019-07-23 DIAGNOSIS — F411 Generalized anxiety disorder: Secondary | ICD-10-CM | POA: Diagnosis present

## 2019-07-23 DIAGNOSIS — D509 Iron deficiency anemia, unspecified: Secondary | ICD-10-CM | POA: Diagnosis present

## 2019-07-23 DIAGNOSIS — R778 Other specified abnormalities of plasma proteins: Secondary | ICD-10-CM | POA: Diagnosis present

## 2019-07-23 DIAGNOSIS — J45909 Unspecified asthma, uncomplicated: Secondary | ICD-10-CM | POA: Diagnosis present

## 2019-07-23 DIAGNOSIS — J9801 Acute bronchospasm: Secondary | ICD-10-CM

## 2019-07-23 DIAGNOSIS — F10931 Alcohol use, unspecified with withdrawal delirium: Secondary | ICD-10-CM

## 2019-07-23 DIAGNOSIS — F10231 Alcohol dependence with withdrawal delirium: Secondary | ICD-10-CM | POA: Diagnosis present

## 2019-07-23 DIAGNOSIS — D61818 Other pancytopenia: Secondary | ICD-10-CM | POA: Diagnosis not present

## 2019-07-23 DIAGNOSIS — E8770 Fluid overload, unspecified: Secondary | ICD-10-CM | POA: Diagnosis not present

## 2019-07-23 DIAGNOSIS — K8591 Acute pancreatitis with uninfected necrosis, unspecified: Secondary | ICD-10-CM | POA: Diagnosis present

## 2019-07-23 DIAGNOSIS — R509 Fever, unspecified: Secondary | ICD-10-CM | POA: Diagnosis not present

## 2019-07-23 DIAGNOSIS — K7011 Alcoholic hepatitis with ascites: Secondary | ICD-10-CM | POA: Diagnosis present

## 2019-07-23 LAB — CBC
HCT: 39.1 % (ref 39.0–52.0)
Hemoglobin: 13.8 g/dL (ref 13.0–17.0)
MCH: 32.9 pg (ref 26.0–34.0)
MCHC: 35.3 g/dL (ref 30.0–36.0)
MCV: 93.3 fL (ref 80.0–100.0)
Platelets: 146 10*3/uL — ABNORMAL LOW (ref 150–400)
RBC: 4.19 MIL/uL — ABNORMAL LOW (ref 4.22–5.81)
RDW: 11.4 % — ABNORMAL LOW (ref 11.5–15.5)
WBC: 5.6 10*3/uL (ref 4.0–10.5)
nRBC: 0 % (ref 0.0–0.2)

## 2019-07-23 LAB — URINALYSIS, ROUTINE W REFLEX MICROSCOPIC
Bilirubin Urine: NEGATIVE
Glucose, UA: NEGATIVE mg/dL
Hgb urine dipstick: NEGATIVE
Ketones, ur: NEGATIVE mg/dL
Nitrite: NEGATIVE
Protein, ur: 30 mg/dL — AB
Specific Gravity, Urine: 1.015 (ref 1.005–1.030)
pH: 7 (ref 5.0–8.0)

## 2019-07-23 LAB — URINALYSIS, MICROSCOPIC (REFLEX): RBC / HPF: NONE SEEN RBC/hpf (ref 0–5)

## 2019-07-23 LAB — COMPREHENSIVE METABOLIC PANEL
ALT: 127 U/L — ABNORMAL HIGH (ref 0–44)
AST: 125 U/L — ABNORMAL HIGH (ref 15–41)
Albumin: 3.6 g/dL (ref 3.5–5.0)
Alkaline Phosphatase: 84 U/L (ref 38–126)
Anion gap: 16 — ABNORMAL HIGH (ref 5–15)
BUN: 12 mg/dL (ref 6–20)
CO2: 24 mmol/L (ref 22–32)
Calcium: 8.6 mg/dL — ABNORMAL LOW (ref 8.9–10.3)
Chloride: 101 mmol/L (ref 98–111)
Creatinine, Ser: 0.56 mg/dL — ABNORMAL LOW (ref 0.61–1.24)
GFR calc Af Amer: >60 mL/min (ref >60)
GFR calc non Af Amer: >60 mL/min (ref >60)
Glucose, Bld: 128 mg/dL — ABNORMAL HIGH (ref 70–99)
Potassium: 4.2 mmol/L (ref 3.5–5.1)
Sodium: 141 mmol/L (ref 135–145)
Total Bilirubin: 0.9 mg/dL (ref 0.3–1.2)
Total Protein: 6.9 g/dL (ref 6.5–8.1)

## 2019-07-23 LAB — TROPONIN I (HIGH SENSITIVITY): Troponin I (High Sensitivity): 16 ng/L (ref <18)

## 2019-07-23 LAB — LIPASE, BLOOD: Lipase: 1502 U/L — ABNORMAL HIGH (ref 11–51)

## 2019-07-23 MED ORDER — METHOCARBAMOL 500 MG PO TABS
ORAL_TABLET | ORAL | Status: AC
  Filled 2019-07-23: qty 1

## 2019-07-23 MED ORDER — MORPHINE SULFATE (PF) 4 MG/ML IV SOLN
4.0000 mg | Freq: Once | INTRAVENOUS | Status: AC
  Administered 2019-07-23: 4 mg via INTRAVENOUS
  Filled 2019-07-23: qty 1

## 2019-07-23 MED ORDER — MORPHINE SULFATE (PF) 4 MG/ML IV SOLN
4.0000 mg | Freq: Once | INTRAVENOUS | Status: AC
  Administered 2019-07-24: 4 mg via INTRAVENOUS
  Filled 2019-07-23: qty 1

## 2019-07-23 MED ORDER — PROMETHAZINE HCL 25 MG/ML IJ SOLN
12.5000 mg | Freq: Once | INTRAMUSCULAR | Status: AC
  Administered 2019-07-23: 12.5 mg via INTRAVENOUS
  Filled 2019-07-23: qty 1

## 2019-07-23 MED ORDER — IOHEXOL 300 MG/ML  SOLN
100.0000 mL | Freq: Once | INTRAMUSCULAR | Status: AC | PRN
  Administered 2019-07-23: 100 mL via INTRAVENOUS

## 2019-07-23 MED ORDER — PANTOPRAZOLE SODIUM 40 MG IV SOLR
INTRAVENOUS | Status: AC
  Administered 2019-07-23: 40 mg
  Filled 2019-07-23: qty 40

## 2019-07-23 MED ORDER — PANTOPRAZOLE SODIUM 40 MG IV SOLR
INTRAVENOUS | Status: AC
  Filled 2019-07-23: qty 80

## 2019-07-23 MED ORDER — SODIUM CHLORIDE 0.9 % IV BOLUS
1000.0000 mL | Freq: Once | INTRAVENOUS | Status: AC
  Administered 2019-07-23: 1000 mL via INTRAVENOUS

## 2019-07-23 MED ORDER — SODIUM CHLORIDE 0.9 % IV SOLN
80.0000 mg | Freq: Once | INTRAVENOUS | Status: AC
  Administered 2019-07-23: 80 mg via INTRAVENOUS
  Filled 2019-07-23: qty 80

## 2019-07-23 NOTE — ED Triage Notes (Signed)
Pt from home, ~1400 n/v/d and abd pain central upper abd, began after eating chili from [employer], v 7-8 times, d 1 times, weak due to abd pain. ns, 4mg  zofran en route. CBG 111. No LOC NAD

## 2019-07-23 NOTE — ED Provider Notes (Signed)
Emergency Department Provider Note   I have reviewed the triage vital signs and the nursing notes.   HISTORY  Chief Complaint No chief complaint on file.   HPI Gary Atkinson is a 49 y.o. male with PMH of CAD presents to the ED with severe epigastric abdominal discomfort with multiple episodes of vomiting and one episode of diarrhea. Symptoms began abruptly after eating at [employer] at approximately 2 PM this afternoon. Patient denies seeing any bright red blood or black in his emesis or stool. He is developed increasing soreness and pain in his epigastric region which is nonradiating. He denies chest pain similar to his prior ACS. He is not experiencing shortness of breath. No fevers or chills. His wife ate at the restaurant as well but did not eat the same thing and has not become ill. He is not having lower abdominal or back pain. No radiation of symptoms or other modifying factors.  Past Medical History:  Diagnosis Date  . Allergic rhinitis   . Anxiety   . Asthma   . CAD (coronary artery disease)   . Depression   . GERD (gastroesophageal reflux disease)   . Hyperlipemia   . Hypertension   . MI (myocardial infarction) (HCC)   . Pneumonia     Patient Active Problem List   Diagnosis Date Noted  . Pancreatitis 07/24/2019  . CAD (coronary artery disease), native coronary artery 08/25/2013  . Bradycardia 08/25/2013  . Benign essential HTN 08/25/2013  . Dyslipidemia 08/25/2013  . Iron deficiency anemia, unspecified 08/25/2013  . CAP (community acquired pneumonia) 08/24/2013    Past Surgical History:  Procedure Laterality Date  . APPENDECTOMY    . CARDIAC CATHETERIZATION     with 2 stents    Allergies Patient has no known allergies.  Family History  Problem Relation Age of Onset  . Liver disease Father   . Alcoholism Father     Social History Social History   Tobacco Use  . Smoking status: Former Smoker    Types: Cigarettes    Quit date: 2002    Years  since quitting: 19.5  . Smokeless tobacco: Never Used  Substance Use Topics  . Alcohol use: Yes    Comment: social  . Drug use: Never    Review of Systems  Constitutional: No fever/chills Eyes: No visual changes. ENT: No sore throat. Cardiovascular: Denies chest pain. Respiratory: Denies shortness of breath. Gastrointestinal: Positive epigastric abdominal pain. Positive nausea, vomiting, and diarrhea.  No constipation. Genitourinary: Negative for dysuria. Musculoskeletal: Negative for back pain. Skin: Negative for rash. Neurological: Negative for headaches, focal weakness or numbness.  10-point ROS otherwise negative.  ____________________________________________   PHYSICAL EXAM:  VITAL SIGNS: ED Triage Vitals  Enc Vitals Group     BP 07/23/19 2150 (!) 203/93     Pulse Rate 07/23/19 2150 60     Resp 07/23/19 2150 19     Temp 07/23/19 2150 98.7 F (37.1 C)     Temp src --      SpO2 07/23/19 2150 100 %     Weight 07/23/19 2152 160 lb (72.6 kg)     Height 07/23/19 2152 5\' 11"  (1.803 m)   Constitutional: Alert and oriented. Patient is diaphoretic and appears uncomfortable.  Eyes: Conjunctivae are normal.  Head: Atraumatic. Nose: No congestion/rhinnorhea. Mouth/Throat: Mucous membranes are moist.  Neck: No stridor.  Cardiovascular: Normal rate, regular rhythm. Good peripheral circulation. Grossly normal heart sounds.   Respiratory: Normal respiratory effort.  No retractions.  Lungs CTAB. Gastrointestinal: Soft with focal epigastric abdominal tenderness. No rebound or guarding. No distention.  Musculoskeletal: No gross deformities of extremities. Neurologic:  Normal speech and language. Skin:  Skin is warm, dry and intact. No rash noted.  ____________________________________________   LABS (all labs ordered are listed, but only abnormal results are displayed)  Labs Reviewed  LIPASE, BLOOD - Abnormal; Notable for the following components:      Result Value    Lipase 1,502 (*)    All other components within normal limits  COMPREHENSIVE METABOLIC PANEL - Abnormal; Notable for the following components:   Glucose, Bld 128 (*)    Creatinine, Ser 0.56 (*)    Calcium 8.6 (*)    AST 125 (*)    ALT 127 (*)    Anion gap 16 (*)    All other components within normal limits  CBC - Abnormal; Notable for the following components:   RBC 4.19 (*)    RDW 11.4 (*)    Platelets 146 (*)    All other components within normal limits  URINALYSIS, ROUTINE W REFLEX MICROSCOPIC - Abnormal; Notable for the following components:   Protein, ur 30 (*)    Leukocytes,Ua TRACE (*)    All other components within normal limits  URINALYSIS, MICROSCOPIC (REFLEX) - Abnormal; Notable for the following components:   Bacteria, UA RARE (*)    All other components within normal limits  CBC WITH DIFFERENTIAL/PLATELET - Abnormal; Notable for the following components:   Lymphs Abs 0.3 (*)    All other components within normal limits  LIPASE, BLOOD - Abnormal; Notable for the following components:   Lipase 1,188 (*)    All other components within normal limits  COMPREHENSIVE METABOLIC PANEL - Abnormal; Notable for the following components:   Glucose, Bld 126 (*)    Calcium 8.5 (*)    AST 118 (*)    ALT 117 (*)    All other components within normal limits  MAGNESIUM - Abnormal; Notable for the following components:   Magnesium 1.5 (*)    All other components within normal limits  LIPID PANEL - Abnormal; Notable for the following components:   Cholesterol 239 (*)    LDL Cholesterol 132 (*)    All other components within normal limits  TROPONIN I (HIGH SENSITIVITY) - Abnormal; Notable for the following components:   Troponin I (High Sensitivity) 52 (*)    All other components within normal limits  TROPONIN I (HIGH SENSITIVITY) - Abnormal; Notable for the following components:   Troponin I (High Sensitivity) 90 (*)    All other components within normal limits  TROPONIN I  (HIGH SENSITIVITY) - Abnormal; Notable for the following components:   Troponin I (High Sensitivity) 77 (*)    All other components within normal limits  SARS CORONAVIRUS 2 BY RT PCR (HOSPITAL ORDER, PERFORMED IN [facility])  HIV ANTIBODY (ROUTINE TESTING W REFLEX)  TROPONIN I (HIGH SENSITIVITY)   ____________________________________________  EKG   EKG Interpretation  Date/Time:  Wednesday July 23 2019 22:46:24 EDT Ventricular Rate:  56 PR Interval:    QRS Duration: 94 QT Interval:  456 QTC Calculation: 441 R Axis:   30 Text Interpretation: Sinus rhythm No STEMI Confirmed by Alona BeneLong, Jayin Derousse [phone]([id number]) on 07/23/2019 10:50:32 PM       ____________________________________________  RADIOLOGY  CT ABDOMEN PELVIS W CONTRAST  Result Date: 07/23/2019 CLINICAL DATA:  Nausea and vomiting. EXAM: CT ABDOMEN AND PELVIS WITH CONTRAST TECHNIQUE: Multidetector CT imaging of  the abdomen and pelvis was performed using the standard protocol following bolus administration of intravenous contrast. CONTRAST:  OMNIPAQUE IOHEXOL 300 MG/ML  SOLN COMPARISON:  None. FINDINGS: Lower chest: The lung bases are clear. The heart size is normal. Hepatobiliary: There is decreased hepatic attenuation suggestive of hepatic steatosis. Normal gallbladder.There is no biliary ductal dilation. Pancreas: There is diffuse peripancreatic fat stranding and free fluid. Approximately 4.6 cm of the pancreatic body and tail is hypoenhancing. Additionally, there is a heterogeneous 2.6 cm area of the pancreatic head and uncinate process that is hypoenhancing. There is no well-formed drainable fluid collection. Spleen: Unremarkable. Adrenals/Urinary Tract: --Adrenal glands: Unremarkable. --Right kidney/ureter: No hydronephrosis or radiopaque kidney stones. --Left kidney/ureter: No hydronephrosis or radiopaque kidney stones. --Urinary bladder: Unremarkable. Stomach/Bowel: --Stomach/Duodenum: No hiatal hernia or other gastric  abnormality. Normal duodenal course and caliber. --Small bowel: Unremarkable. --Colon: Unremarkable. --Appendix: Not visualized. No right lower quadrant inflammation or free fluid. Vascular/Lymphatic: Normal course and caliber of the major abdominal vessels. --No retroperitoneal lymphadenopathy. --No mesenteric lymphadenopathy. --No pelvic or inguinal lymphadenopathy. Reproductive: Unremarkable Other: There is free fluid in the abdomen and pelvis the abdominal wall is normal. Musculoskeletal. No acute displaced fractures. IMPRESSION: 1. Findings consistent with acute pancreatitis. There are hypoenhancing areas of the pancreatic tail and head concerning for developing areas of pancreatic necrosis. 2. Hepatic steatosis. 3. Small volume free fluid in the abdomen and pelvis. Electronically Signed   By: Katherine Mantle M.D.   On: 07/23/2019 23:32   DG Chest Portable 1 View  Result Date: 07/23/2019 CLINICAL DATA:  Vomiting, epigastric pain EXAM: PORTABLE CHEST 1 VIEW COMPARISON:  None. FINDINGS: Lungs are clear. No pneumothorax or pleural effusion. Coronary artery stenting has been performed. Cardiac size within normal limits. The pulmonary vascularity is normal. No acute bone abnormality. IMPRESSION: No active disease. Electronically Signed   By: Helyn Numbers MD   On: 07/23/2019 23:06    ____________________________________________   PROCEDURES  Procedure(s) performed:   Procedures  None ____________________________________________   INITIAL IMPRESSION / ASSESSMENT AND PLAN / ED COURSE  Pertinent labs & imaging results that were available during my care of the patient were reviewed by me and considered in my medical decision making (see chart for details).   Patient presents emergency department with multiple episodes of vomiting and epigastric abdominal pain. He arrives diaphoretic and appears unwell. He does have history of ACS and I have added on troponin, EKG, chest x-ray in addition to  labs ordered from triage. Patient arrived by ambulance and received Zofran prior to arrival. Continues to have nausea but no vomiting here. We will add Phenergan, morphine, Protonix, IV fluids. Will need chest x-ray to evaluate for esophageal tear but lower suspicion for this clinically without bleeding or shortness of breath. Plan also for CT abdomen pelvis given the patient's initial presentation and focal epigastric abdominal pain. EKG interpreted by me as above with no acute ischemic change.  11:35 PM  Patient's lipase elevated to greater than 1500 with mild elevation in LFTs.  Bilirubin is normal.  CT shows evidence of acute pancreatitis with questionable areas of developing necrosis.  No leukocytosis or fever.  Other than elevated blood pressure vital signs are unremarkable.  On reassessment the patient is looking and feeling better but requesting more pain medication.  He denies any prior history of pancreatitis.  He denies drinking alcohol or heavy/daily drinking.  He does have a mild tremor noted on exam at times.  Plan for admission for pancreatitis management.  He has been vaccinated for COVID.   Discussed patient's case with [facility] to request admission. Patient and family (if present) updated with plan. Care transferred to [facility] service.  I reviewed all nursing notes, vitals, pertinent old records, EKGs, labs, imaging (as available).  ____________________________________________  FINAL CLINICAL IMPRESSION(S) / ED DIAGNOSES  Final diagnoses:  Acute pancreatitis, unspecified complication status, unspecified pancreatitis type     MEDICATIONS GIVEN DURING THIS VISIT:  Medications  pantoprazole (PROTONIX) 40 MG injection (has no administration in time range)  HYDROmorphone (DILAUDID) injection 1 mg (1 mg Intravenous Given 07/24/19 1022)  acetaminophen (TYLENOL) tablet 650 mg (has no administration in time range)    Or  acetaminophen (TYLENOL) suppository 650 mg (has no administration in time  range)  ondansetron (ZOFRAN) tablet 4 mg (has no administration in time range)    Or  ondansetron (ZOFRAN) injection 4 mg (has no administration in time range)  pantoprazole (PROTONIX) injection 40 mg (has no administration in time range)  lactated ringers infusion ( Intravenous New Bag/Given 07/24/19 0946)  sodium chloride 0.9 % bolus 1,000 mL ( Intravenous Stopped 07/23/19 2321)  pantoprazole (PROTONIX) 80 mg in sodium chloride 0.9 % 100 mL IVPB ( Intravenous Stopped 07/23/19 2352)  morphine 4 MG/ML injection 4 mg (4 mg Intravenous Given 07/23/19 2250)  promethazine (PHENERGAN) injection 12.5 mg (12.5 mg Intravenous Given 07/23/19 2250)  pantoprazole (PROTONIX) 40 MG injection (40 mg  Given 07/23/19 2305)  iohexol (OMNIPAQUE) 300 MG/ML solution 100 mL (100 mLs Intravenous Contrast Given 07/23/19 2319)  morphine 4 MG/ML injection 4 mg (4 mg Intravenous Given 07/24/19 0005)  HYDROmorphone (DILAUDID) injection 1 mg (1 mg Intravenous Given 07/24/19 0105)  HYDROmorphone (DILAUDID) injection 1 mg (1 mg Intravenous Given 07/24/19 0406)  lactated ringers bolus 1,000 mL (1,000 mLs Intravenous New Bag/Given 07/24/19 0615)  metoprolol tartrate (LOPRESSOR) injection 5 mg (5 mg Intravenous Given 07/24/19 0615)    Note:  This document was prepared using Dragon voice recognition software and may include unintentional dictation errors.  Alona Bene, MD, [facility]    Zenna Traister, Arlyss Repress, MD 07/24/19 [phone]

## 2019-07-23 NOTE — ED Notes (Signed)
Pt unable to urinate at this time.  

## 2019-07-23 NOTE — ED Notes (Signed)
ED Provider at bedside. 

## 2019-07-24 ENCOUNTER — Inpatient Hospital Stay (HOSPITAL_COMMUNITY): Payer: 59

## 2019-07-24 DIAGNOSIS — K219 Gastro-esophageal reflux disease without esophagitis: Secondary | ICD-10-CM | POA: Diagnosis present

## 2019-07-24 DIAGNOSIS — F1010 Alcohol abuse, uncomplicated: Secondary | ICD-10-CM

## 2019-07-24 DIAGNOSIS — F411 Generalized anxiety disorder: Secondary | ICD-10-CM | POA: Diagnosis present

## 2019-07-24 DIAGNOSIS — R7989 Other specified abnormal findings of blood chemistry: Secondary | ICD-10-CM | POA: Diagnosis present

## 2019-07-24 DIAGNOSIS — D6489 Other specified anemias: Secondary | ICD-10-CM | POA: Diagnosis present

## 2019-07-24 DIAGNOSIS — E876 Hypokalemia: Secondary | ICD-10-CM | POA: Diagnosis present

## 2019-07-24 DIAGNOSIS — J90 Pleural effusion, not elsewhere classified: Secondary | ICD-10-CM | POA: Diagnosis not present

## 2019-07-24 DIAGNOSIS — K8521 Alcohol induced acute pancreatitis with uninfected necrosis: Secondary | ICD-10-CM | POA: Diagnosis present

## 2019-07-24 DIAGNOSIS — K859 Acute pancreatitis without necrosis or infection, unspecified: Secondary | ICD-10-CM | POA: Diagnosis present

## 2019-07-24 DIAGNOSIS — I2510 Atherosclerotic heart disease of native coronary artery without angina pectoris: Secondary | ICD-10-CM | POA: Diagnosis present

## 2019-07-24 DIAGNOSIS — R778 Other specified abnormalities of plasma proteins: Secondary | ICD-10-CM | POA: Diagnosis present

## 2019-07-24 DIAGNOSIS — R109 Unspecified abdominal pain: Secondary | ICD-10-CM | POA: Diagnosis present

## 2019-07-24 DIAGNOSIS — J45909 Unspecified asthma, uncomplicated: Secondary | ICD-10-CM | POA: Diagnosis present

## 2019-07-24 DIAGNOSIS — D61818 Other pancytopenia: Secondary | ICD-10-CM | POA: Diagnosis not present

## 2019-07-24 DIAGNOSIS — K8591 Acute pancreatitis with uninfected necrosis, unspecified: Secondary | ICD-10-CM | POA: Diagnosis present

## 2019-07-24 DIAGNOSIS — K449 Diaphragmatic hernia without obstruction or gangrene: Secondary | ICD-10-CM | POA: Diagnosis present

## 2019-07-24 DIAGNOSIS — F10231 Alcohol dependence with withdrawal delirium: Secondary | ICD-10-CM | POA: Diagnosis present

## 2019-07-24 DIAGNOSIS — E785 Hyperlipidemia, unspecified: Secondary | ICD-10-CM | POA: Diagnosis present

## 2019-07-24 DIAGNOSIS — E871 Hypo-osmolality and hyponatremia: Secondary | ICD-10-CM | POA: Diagnosis not present

## 2019-07-24 DIAGNOSIS — K760 Fatty (change of) liver, not elsewhere classified: Secondary | ICD-10-CM | POA: Diagnosis present

## 2019-07-24 DIAGNOSIS — D509 Iron deficiency anemia, unspecified: Secondary | ICD-10-CM | POA: Diagnosis present

## 2019-07-24 DIAGNOSIS — J9811 Atelectasis: Secondary | ICD-10-CM | POA: Diagnosis not present

## 2019-07-24 DIAGNOSIS — Z20822 Contact with and (suspected) exposure to covid-19: Secondary | ICD-10-CM | POA: Diagnosis present

## 2019-07-24 DIAGNOSIS — K7011 Alcoholic hepatitis with ascites: Secondary | ICD-10-CM | POA: Diagnosis present

## 2019-07-24 DIAGNOSIS — R112 Nausea with vomiting, unspecified: Secondary | ICD-10-CM | POA: Diagnosis present

## 2019-07-24 DIAGNOSIS — R1010 Upper abdominal pain, unspecified: Secondary | ICD-10-CM

## 2019-07-24 DIAGNOSIS — I10 Essential (primary) hypertension: Secondary | ICD-10-CM | POA: Diagnosis present

## 2019-07-24 DIAGNOSIS — D696 Thrombocytopenia, unspecified: Secondary | ICD-10-CM | POA: Diagnosis present

## 2019-07-24 DIAGNOSIS — I248 Other forms of acute ischemic heart disease: Secondary | ICD-10-CM | POA: Diagnosis present

## 2019-07-24 DIAGNOSIS — I252 Old myocardial infarction: Secondary | ICD-10-CM | POA: Diagnosis not present

## 2019-07-24 DIAGNOSIS — K852 Alcohol induced acute pancreatitis without necrosis or infection: Secondary | ICD-10-CM

## 2019-07-24 LAB — CBC WITH DIFFERENTIAL/PLATELET
Abs Immature Granulocytes: 0.02 10*3/uL (ref 0.00–0.07)
Basophils Absolute: 0 10*3/uL (ref 0.0–0.1)
Basophils Relative: 0 %
Eosinophils Absolute: 0 10*3/uL (ref 0.0–0.5)
Eosinophils Relative: 0 %
HCT: 44.1 % (ref 39.0–52.0)
Hemoglobin: 14.8 g/dL (ref 13.0–17.0)
Immature Granulocytes: 0 %
Lymphocytes Relative: 7 %
Lymphs Abs: 0.3 10*3/uL — ABNORMAL LOW (ref 0.7–4.0)
MCH: 32.6 pg (ref 26.0–34.0)
MCHC: 33.6 g/dL (ref 30.0–36.0)
MCV: 97.1 fL (ref 80.0–100.0)
Monocytes Absolute: 0.6 10*3/uL (ref 0.1–1.0)
Monocytes Relative: 11 %
Neutro Abs: 4.2 10*3/uL (ref 1.7–7.7)
Neutrophils Relative %: 82 %
Platelets: 151 10*3/uL (ref 150–400)
RBC: 4.54 MIL/uL (ref 4.22–5.81)
RDW: 11.9 % (ref 11.5–15.5)
WBC: 5.1 10*3/uL (ref 4.0–10.5)
nRBC: 0 % (ref 0.0–0.2)

## 2019-07-24 LAB — LIPID PANEL
Cholesterol: 239 mg/dL — ABNORMAL HIGH (ref 0–200)
HDL: 92 mg/dL (ref >40)
LDL Cholesterol: 132 mg/dL — ABNORMAL HIGH (ref 0–99)
Total CHOL/HDL Ratio: 2.6 RATIO
Triglycerides: 74 mg/dL (ref <150)
VLDL: 15 mg/dL (ref 0–40)

## 2019-07-24 LAB — TROPONIN I (HIGH SENSITIVITY)
Troponin I (High Sensitivity): 52 ng/L — ABNORMAL HIGH (ref <18)
Troponin I (High Sensitivity): 90 ng/L — ABNORMAL HIGH (ref <18)
Troponin I (High Sensitivity): 77 ng/L — ABNORMAL HIGH (ref <18)

## 2019-07-24 LAB — COMPREHENSIVE METABOLIC PANEL
ALT: 117 U/L — ABNORMAL HIGH (ref 0–44)
AST: 118 U/L — ABNORMAL HIGH (ref 15–41)
Albumin: 3.8 g/dL (ref 3.5–5.0)
Alkaline Phosphatase: 82 U/L (ref 38–126)
Anion gap: 15 (ref 5–15)
BUN: 17 mg/dL (ref 6–20)
CO2: 24 mmol/L (ref 22–32)
Calcium: 8.5 mg/dL — ABNORMAL LOW (ref 8.9–10.3)
Chloride: 103 mmol/L (ref 98–111)
Creatinine, Ser: 0.77 mg/dL (ref 0.61–1.24)
GFR calc Af Amer: >60 mL/min (ref >60)
GFR calc non Af Amer: >60 mL/min (ref >60)
Glucose, Bld: 126 mg/dL — ABNORMAL HIGH (ref 70–99)
Potassium: 4.6 mmol/L (ref 3.5–5.1)
Sodium: 142 mmol/L (ref 135–145)
Total Bilirubin: 1.1 mg/dL (ref 0.3–1.2)
Total Protein: 7 g/dL (ref 6.5–8.1)

## 2019-07-24 LAB — MAGNESIUM: Magnesium: 1.5 mg/dL — ABNORMAL LOW (ref 1.7–2.4)

## 2019-07-24 LAB — HIV ANTIBODY (ROUTINE TESTING W REFLEX): HIV Screen 4th Generation wRfx: NONREACTIVE

## 2019-07-24 LAB — LIPASE, BLOOD: Lipase: 1188 U/L — ABNORMAL HIGH (ref 11–51)

## 2019-07-24 LAB — SARS CORONAVIRUS 2 BY RT PCR (HOSPITAL ORDER, PERFORMED IN ~~LOC~~ HOSPITAL LAB): SARS Coronavirus 2: NEGATIVE

## 2019-07-24 MED ORDER — ACETAMINOPHEN 650 MG RE SUPP
650.0000 mg | Freq: Four times a day (QID) | RECTAL | Status: DC | PRN
  Administered 2019-07-26: 650 mg via RECTAL
  Filled 2019-07-24: qty 1

## 2019-07-24 MED ORDER — ACETAMINOPHEN 325 MG PO TABS
650.0000 mg | ORAL_TABLET | Freq: Four times a day (QID) | ORAL | Status: DC | PRN
  Administered 2019-07-29 – 2019-08-05 (×13): 650 mg via ORAL
  Filled 2019-07-24 (×14): qty 2

## 2019-07-24 MED ORDER — METOPROLOL TARTRATE 5 MG/5ML IV SOLN
5.0000 mg | Freq: Once | INTRAVENOUS | Status: AC
  Administered 2019-07-24: 5 mg via INTRAVENOUS
  Filled 2019-07-24: qty 5

## 2019-07-24 MED ORDER — HYDROMORPHONE HCL 1 MG/ML IJ SOLN
1.0000 mg | Freq: Once | INTRAMUSCULAR | Status: AC
  Administered 2019-07-24: 1 mg via INTRAVENOUS
  Filled 2019-07-24: qty 1

## 2019-07-24 MED ORDER — ONDANSETRON HCL 4 MG PO TABS: 4.0000 mg | ORAL_TABLET | Freq: Four times a day (QID) | ORAL | Status: DC | PRN

## 2019-07-24 MED ORDER — BUSPIRONE HCL 10 MG PO TABS
20.0000 mg | ORAL_TABLET | Freq: Two times a day (BID) | ORAL | Status: DC
  Administered 2019-07-24 – 2019-08-06 (×25): 20 mg via ORAL
  Filled 2019-07-24 (×6): qty 2
  Filled 2019-07-24: qty 4
  Filled 2019-07-24 (×9): qty 2
  Filled 2019-07-24 (×2): qty 4
  Filled 2019-07-24 (×7): qty 2

## 2019-07-24 MED ORDER — OXYCODONE HCL 5 MG PO TABS
5.0000 mg | ORAL_TABLET | ORAL | Status: DC | PRN
  Administered 2019-07-24 – 2019-07-31 (×8): 10 mg via ORAL
  Filled 2019-07-24 (×8): qty 2

## 2019-07-24 MED ORDER — LACTATED RINGERS IV BOLUS
1000.0000 mL | Freq: Once | INTRAVENOUS | Status: AC
  Administered 2019-07-24: 1000 mL via INTRAVENOUS

## 2019-07-24 MED ORDER — FLUVOXAMINE MALEATE 50 MG PO TABS
150.0000 mg | ORAL_TABLET | Freq: Two times a day (BID) | ORAL | Status: DC
  Administered 2019-07-24 – 2019-08-06 (×25): 150 mg via ORAL
  Filled 2019-07-24 (×20): qty 3
  Filled 2019-07-24: qty 2
  Filled 2019-07-24 (×6): qty 3

## 2019-07-24 MED ORDER — SODIUM CHLORIDE 0.45 % IV SOLN: INTRAVENOUS | Status: DC

## 2019-07-24 MED ORDER — BUSPIRONE HCL 5 MG PO TABS: 20.0000 mg | ORAL_TABLET | Freq: Two times a day (BID) | ORAL | Status: DC

## 2019-07-24 MED ORDER — ONDANSETRON HCL 4 MG/2ML IJ SOLN: 4.0000 mg | Freq: Four times a day (QID) | INTRAMUSCULAR | Status: DC | PRN

## 2019-07-24 MED ORDER — LACTATED RINGERS IV SOLN: INTRAVENOUS | Status: DC

## 2019-07-24 MED ORDER — PANTOPRAZOLE SODIUM 40 MG IV SOLR
40.0000 mg | INTRAVENOUS | Status: DC
  Administered 2019-07-24 – 2019-08-04 (×12): 40 mg via INTRAVENOUS
  Filled 2019-07-24 (×10): qty 40

## 2019-07-24 MED ORDER — GADOBUTROL 1 MMOL/ML IV SOLN
8.0000 mL | Freq: Once | INTRAVENOUS | Status: AC | PRN
  Administered 2019-07-24: 8 mL via INTRAVENOUS

## 2019-07-24 MED ORDER — HYDROMORPHONE HCL 1 MG/ML IJ SOLN
1.0000 mg | INTRAMUSCULAR | Status: DC | PRN
  Administered 2019-07-24 – 2019-07-26 (×18): 1 mg via INTRAVENOUS
  Filled 2019-07-24 (×18): qty 1

## 2019-07-24 MED ORDER — FLUVOXAMINE MALEATE 50 MG PO TABS
150.0000 mg | ORAL_TABLET | Freq: Two times a day (BID) | ORAL | Status: DC
  Filled 2019-07-24: qty 3

## 2019-07-24 NOTE — Consult Note (Addendum)
,   Consultation  Referring Provider: [facility] Primary Care Physician:  Patient, No Pcp Per Primary Gastroenterologist:  Dr.Armbruster  Reason for Consultation:   Acute pancreatitis, chronic LFT elevation  HPI: Gary Atkinson is a 49 y.o. male, known to Dr. Adela LankArmbruster with history of chronic refractory GERD, and who also underwent prior work-up for transaminitis, who we are asked to see today for acute pancreatitis.  Patient was admitted last night through the emergency room after acute onset of severe abdominal pain in the upper abdomen radiating to his back and associated with several episodes of nausea and vomiting last night.  He has no prior history of pancreatitis. CT of the abdomen pelvis shows hepatic steatosis, normal-appearing gallbladder, no ductal dilation, there is diffuse peripancreatic fat stranding and some free fluid.  There is also 4.6 cm area in the body and tail, and a 2.6 cm area in the head of the pancreas with hypoattenuation concerning for possible developing necrosis. Initial labs show WBC of 5.6, hemoglobin 13.8 platelets 146, COVID-19 negative, initial troponin normal, second specimen elevated at 52 Lipase 1500 and LFTs with AST 125/ALT 127 and T bili 0.9  Patient relates no recent new medications or changes in medications.  He denies any daily EtOH use but says that he did drink pretty heavily last weekend while having a guys weekend golfing.  No liquor but had multiple beers. Patient also states that he has been extremely stressed over the past months due to loss of his job, unemployment etc. He is currently taking Dexilant 60 mg p.o. daily for GERD and says even with this his symptoms are not controlled.  He continues to have symptoms off and on through the day and occasionally at night.  Some days better than others.  He does not feel that his symptoms are necessarily worsened by dietary indiscretion, he says even very nonacidic foods etc. will sometimes aggravate  the reflux.  No dysphagia. He has prior history of iron deficiency anemia and had [facility] EGD colonoscopy and capsule endoscopy all of which were unrevealing. He had EGD per Dr. Adela LankArmbruster in December 2020 with finding of a 1 cm hiatal hernia, no evidence of Barrett's. Medical history is pertinent for coronary artery disease status post drug-eluting stent, chronic GERD as above, migraines.   He had had a 30+ pound weight loss last fall most of which was intentional.  Weight has been relatively stable since. Work-up for elevated LFTs with imaging showing hepatic steatosis. Hepatitis serologies, autoimmune labs and markers for inheritable liver disease all negative   Past Medical History:  Diagnosis Date  . Allergic rhinitis   . Anxiety   . Asthma   . CAD (coronary artery disease)   . Depression   . GERD (gastroesophageal reflux disease)   . Hyperlipemia   . Hypertension   . MI (myocardial infarction) (HCC)   . Pneumonia     Past Surgical History:  Procedure Laterality Date  . APPENDECTOMY    . CARDIAC CATHETERIZATION     with 2 stents    Prior to Admission medications   Medication Sig Start Date End Date Taking? Authorizing Provider  acetaminophen (TYLENOL) 325 MG tablet Take 650 mg by mouth every 6 (six) hours as needed for mild pain or headache.   Yes [provider]  Ascorbic Acid (VITAMIN C PO) Take 1 tablet by mouth daily.   Yes [provider]  aspirin 81 MG tablet Take 81 mg by  mouth at bedtime.   Yes [provider]  busPIRone (BUSPAR) 10 MG tablet Take 20 mg by mouth 2 (two) times daily. 07/08/19  Yes [provider]  clonazePAM (KLONOPIN) 1 MG disintegrating tablet Take 1 mg by mouth 2 (two) times daily as needed for anxiety. 07/08/19  Yes [provider]  dexlansoprazole (DEXILANT) 60 MG capsule Take 1 capsule (60 mg total) by mouth daily. 01/09/19  Yes Armbruster, Willaim Rayas, MD  esomeprazole  (NEXIUM) 40 MG packet Take 40 mg by mouth 2 (two) times daily before a meal. Patient taking differently: Take 40 mg by mouth 2 (two) times daily as needed (indigestion).  01/07/19  Yes Armbruster, Willaim Rayas, MD  Fluvoxamine Maleate 150 MG CP24 Take 300 mg by mouth daily. 06/11/19  Yes [provider]  lisinopril (ZESTRIL) 5 MG tablet Take 5 mg by mouth daily.  12/12/18 07/24/19 Yes [provider]  nitroGLYCERIN (NITROSTAT) 0.4 MG SL tablet Place 0.4 mg under the tongue as needed for chest pain.  02/23/13 07/24/19 Yes [provider]  propranolol (INDERAL) 40 MG tablet Take 20 mg by mouth daily. 05/15/18 07/24/19 Yes [provider]  topiramate (TOPAMAX) 25 MG tablet Take 25 mg by mouth daily.  12/12/18  Yes [provider]  ondansetron (ZOFRAN ODT) 4 MG disintegrating tablet Take 1 tablet (4 mg total) by mouth every 8 (eight) hours as needed for nausea or vomiting. Patient not taking: Reported on 07/24/2019 01/20/19   Benancio Deeds, MD  sucralfate (CARAFATE) 1 GM/10ML suspension Take 10 mLs (1 g total) by mouth every 6 (six) hours as needed. Patient not taking: Reported on 12/17/2018 12/13/18   Armbruster, Willaim Rayas, MD    Current Facility-Administered Medications  Medication Dose Route Frequency Provider Last Rate Last Admin  . acetaminophen (TYLENOL) tablet 650 mg  650 mg Oral Q6H PRN Bobette Mo, MD       Or  . acetaminophen (TYLENOL) suppository 650 mg  650 mg Rectal Q6H PRN Bobette Mo, MD      . HYDROmorphone (DILAUDID) injection 1 mg  1 mg Intravenous Q2H PRN Mesner, Barbara Cower, MD   1 mg at 07/24/19 6948  . lactated ringers infusion   Intravenous Continuous Roberto Scales D, MD      . ondansetron [facility]) tablet 4 mg  4 mg Oral Q6H PRN Bobette Mo, MD       Or  . ondansetron [facility]) injection 4 mg  4 mg Intravenous Q6H PRN Bobette Mo, MD      . pantoprazole (PROTONIX) 40 MG injection           . pantoprazole  (PROTONIX) injection 40 mg  40 mg Intravenous Q24H Bobette Mo, MD        Allergies as of 07/23/2019  . (No Known Allergies)    Family History  Problem Relation Age of Onset  . Liver disease Father   . Alcoholism Father     Social History   Socioeconomic History  . Marital status: Single    Spouse name: Not on file  . Number of children: 0  . Years of education: Not on file  . Highest education level: Not on file  Occupational History  . Occupation: Airline pilot  Tobacco Use  . Smoking status: Former Smoker    Types: Cigarettes    Quit date: 2002    Years since quitting: 19.5  . Smokeless tobacco: Never Used  Substance and Sexual Activity  . Alcohol  use: Yes    Comment: social  . Drug use: Never  . Sexual activity: Not on file  Other Topics Concern  . Not on file  Social History Narrative  . Not on file   Social Determinants of Health   Financial Resource Strain:   . Difficulty of Paying Living Expenses:   Food Insecurity:   . Worried About Programme researcher, broadcasting/film/video in the Last Year:   . Barista in the Last Year:   Transportation Needs:   . Freight forwarder (Medical):   Marland Kitchen Lack of Transportation (Non-Medical):   Physical Activity:   . Days of Exercise per Week:   . Minutes of Exercise per Session:   Stress:   . Feeling of Stress :   Social Connections:   . Frequency of Communication with Friends and Family:   . Frequency of Social Gatherings with Friends and Family:   . Attends Religious Services:   . Active Member of Clubs or Organizations:   . Attends Banker Meetings:   Marland Kitchen Marital Status:   Intimate Partner Violence:   . Fear of Current or Ex-Partner:   . Emotionally Abused:   Marland Kitchen Physically Abused:   . Sexually Abused:     Review of Systems: Pertinent positive and negative review of systems were noted in the above HPI section.  All other review of systems was otherwise negative.  Physical Exam: Vital signs in last 24  hours: Temp:  [97.9 F (36.6 C)-98.7 F (37.1 C)] 97.9 F (36.6 C) (07/22 0552) Pulse Rate:  [60-94] 94 (07/22 0552) Resp:  [14-21] 16 (07/22 0552) BP: (154-203)/(93-111) 167/111 (07/22 0552) SpO2:  [97 %-100 %] 99 % (07/22 0552) Weight:  [72.6 kg-74.9 kg] 74.9 kg (07/22 0552) Last BM Date: 07/23/19 General:   Alert,  Well-developed, acutely ill-appearing white male pleasant and cooperative in NAD.  Slight tremulous Head:  Normocephalic and atraumatic. Eyes:  Sclera clear, no icterus.   Conjunctiva pink. Ears:  Normal auditory acuity. Nose:  No deformity, discharge,  or lesions. Mouth:  No deformity or lesions.   Neck:  Supple; no masses or thyromegaly. Lungs:  Clear throughout to auscultation.   No wheezes, crackles, or rhonchi. Heart:  Regular rate and rhythm; no murmurs, clicks, rubs,  or gallops. Abdomen: Soft, diffusely tender across the upper abdomen no palpable mass or hepatosplenomegaly bowel sounds are present Rectal:  Deferred  Msk:  Symmetrical without gross deformities. . Pulses:  Normal pulses noted. Extremities:  Without clubbing or edema. Neurologic:  Alert and  oriented x4;  grossly normal neurologically. Skin:  Intact without significant lesions or rashes.. Psych:  Alert and cooperative. Normal mood and affect.  Intake/Output from previous day: 07/21 0701 - 07/22 0700 In: 398.7 [IV Piggyback:398.7] Out: -  Intake/Output this shift: No intake/output data recorded.  Lab Results: Recent Labs    07/23/19 2156 07/24/19 0610  WBC 5.6 5.1  HGB 13.8 14.8  HCT 39.1 44.1  PLT 146* 151   BMET Recent Labs    07/23/19 2156 07/24/19 0610  NA 141 142  K 4.2 4.6  CL 101 103  CO2 24 24  GLUCOSE 128* 126*  BUN 12 17  CREATININE 0.56* 0.77  CALCIUM 8.6* 8.5*   LFT Recent Labs    07/24/19 0610  PROT 7.0  ALBUMIN 3.8  AST 118*  ALT 117*  ALKPHOS 82  BILITOT 1.1   PT/INR No results for input(s): LABPROT, INR in the last 72 hours.  Hepatitis Panel No  results for input(s): HEPBSAG, HCVAB, HEPAIGM, HEPBIGM in the last 72 hours.    IMPRESSION:  #15 49 year old white male with acute pancreatitis with initial imaging raising question of 2 areas of potential developing necrosis.  Patient has been hemodynamically stable without any worrisome parameters as of this a.m. Etiology of the pancreatitis is not clear, suspect EtOH induced With mild transaminitis also consider CBD stone or sludge  #2 history of refractory GERD-symptomatic despite Dexilant #3 history of coronary artery disease status post stent 4.  History of elevated LFTs-LFTs had actually normalized as of December 2020 and extensive lab work-up all negative for autoimmune, viral, and inheritable liver disease.  Suspect secondary to fatty liver/[place] #5 history of iron deficiency anemia with previous negative work-up 2016  Plan; continue vigorous hydration, currently on 150 cc/h Sips only clear liquids Pain control and antiemetics Continue IV PPI Trend daily labs Have scheduled for MRI/MRCP Start incentive spirometry Thank you will follow with you     Amy Esterwood PA-C 07/24/2019, 9:36 AM    Attending physician's note   I have taken a history, examined the patient and reviewed the chart. I agree with the Advanced Practitioner's note, impression and recommendations.  49 year old male with history of GERD, abnormal LFT in the past, alcohol use admitted with acute pancreatitis  CT abdomen pelvis with normal gallbladder and no biliary dilation  Likely etiology of pancreatitis is alcohol related, patient is not forthcoming regarding how much alcohol he drinks.  Admitted to having a boy's reunion and drinking multiple beers for 4 days prior to the admission  Will need to exclude possible biliary etiology, will obtain MRCP  Monitor for alcohol withdrawal  Continue IV fluids  Sips of clears as tolerated  Inpatient GI team will continue to follow along  K. Scherry Ran ,  MD [phone]

## 2019-07-24 NOTE — ED Notes (Signed)
Dr. Clayborne Dana and admitting MD Dr. Rachael Darby aware of Troponin level of 52. No new orders. Pt free from chest pain since arrival. NSR. 6E RN Carollee Herter informed.

## 2019-07-24 NOTE — H&P (Addendum)
History and Physical  Marquest Gunkel BVQ:[id number] DOB: Nov 21, 1970 DOA: 07/23/2019  Referring physician: Nanda Quinton, MD PCP: Patient, No Pcp Per  Outpatient Specialists: Dr. Havery Moros (GI) Patient coming from:Home   Chief Complaint: nausea, vomiting, abdominal pain   HPI: Gary Atkinson is a 49 y.o. male with medical history significant for GERD, HTN, chronic elevated LFTs who presents on 07/23/2019 with two days of nausea, vomiting, and abdominal pain. 2 days prior he noticed hotflashes   Noticed hotflashes 2 days ago. Ate chilli at a restaurant with a friend and again felt flushed and sweaty with profuse nausea followed by several episodes ( 6) in car during drive from [place] back to his home in [place]. Then an additional 6 times of emesis with any attempt of oral intake at home. He also endorsed abdominal pain later in the evening located in central of abdomen he used electrotherapy (TENS unit) he applied it to his back and it improved the abdominal pain. He had increased weakness and inability to even go up his stairs at home so he called the ambulance. They gave him IVF and he went to [facility].  Alcohol intake. Social drinker ( playing golf). Not daily. 6 beers a week sometimes ( light beer)  Lost 60 lbs in 6 months; he states it was intentionally in 2020 with changes in diet and increased activity at work. He has been seen at [facility] (Dr. Havery Moros) for self referral for further evaluation of reflux and weight loss (213 lbs in March and 169 during  pounds on 12 on 12/13/2018.  At that time states weight loss related to decreased appetite and early satiety; he admitted to inability to obtain protonix which previously helped because of loss of insurance in March.  He was noted to have chronically elevated LFTs. Hepatitis panel was negative and autoimmune workup was negative  He had EGD on 12/2018 which showed small hiatal hernia and mild erythema of stomach. Pathology was  negative  Denies any fevers or chills. No chest pain. No changes in medications.  Diarrhea--yesterday. ,loose stool x 1.   Acid reflux. On PPI(dexilant and nexium) on carafate. Says not well controlled. Has acidic taste in mouth. Abdominal discomfort. Hasn't filled carafate in a while  ED Course: Afebrile, BP 166/105, troponin trend 16--52, COVID neg, Lipase 1,502. AST 125, ALT 127, plt 146.  CT abd consistnet with acute pancreatitis, and concerning area of developing pancreatitic necrosis, hepatic steatosis.  Given IV protonix, IVF, IV morphine for pain control at [facility] before transfer to [facility].   Review of Systems:As mentioned in the history of present illness.Review of systems are otherwise negative Patient seen on the floor .   Past Medical History:  Diagnosis Date  . Allergic rhinitis   . Anxiety   . Asthma   . CAD (coronary artery disease)   . Depression   . GERD (gastroesophageal reflux disease)   . Hyperlipemia   . Hypertension   . MI (myocardial infarction) ([place])   . Pneumonia    Past Surgical History:  Procedure Laterality Date  . APPENDECTOMY    . CARDIAC CATHETERIZATION     with 2 stents   No Known Allergies Social History:  reports that he quit smoking about 19 years ago. His smoking use included cigarettes. He has never used smokeless tobacco. He reports current alcohol use. He reports that he does not use drugs. Family History  Problem Relation Age of Onset  . Liver disease Father   . Alcoholism Father  Prior to Admission medications   Medication Sig Start Date End Date Taking? Authorizing Provider  albuterol (PROVENTIL HFA;VENTOLIN HFA) 108 (90 BASE) MCG/ACT inhaler Inhale 2 puffs into the lungs every 6 (six) hours as needed for wheezing or shortness of breath.    [provider]  aspirin 81 MG tablet Take 81 mg by mouth at bedtime.    [provider]  clonazePAM (KLONOPIN) 1 MG tablet Take 1 tablet by mouth as needed. 05/14/15   [provider]  dexlansoprazole (DEXILANT) 60 MG capsule Take 1 capsule (60 mg total) by mouth daily. 01/09/19   Armbruster, Carlota Raspberry, MD  esomeprazole (NEXIUM) 40 MG packet Take 40 mg by mouth 2 (two) times daily before a meal. 01/07/19   Armbruster, Carlota Raspberry, MD  fexofenadine (ALLEGRA) 180 MG tablet Take 1 tablet by mouth daily. 03/13/18   [provider]  Fluticasone-Salmeterol (WIXELA INHUB) 250-50 MCG/DOSE AEPB Inhale 1 puff into the lungs as needed. 11/22/17   [provider]  hydrOXYzine (ATARAX/VISTARIL) 50 MG tablet Take 1 tablet by mouth as needed.    [provider]  lisinopril (ZESTRIL) 5 MG tablet Take 1 tablet by mouth daily. 12/12/18 01/11/19  [provider]  nitroGLYCERIN (NITROSTAT) 0.4 MG SL tablet Place 1 tablet under the tongue as needed. 02/23/13 05/15/19  [provider]  ondansetron (ZOFRAN ODT) 4 MG disintegrating tablet Take 1 tablet (4 mg total) by mouth every 8 (eight) hours as needed for nausea or vomiting. 01/20/19   Armbruster, Carlota Raspberry, MD  pantoprazole (PROTONIX) 40 MG tablet Take 2 tablets by mouth daily. 11/02/18   [provider]  propranolol (INDERAL) 40 MG tablet Take 20 mg by mouth daily. 05/15/18 01/11/19  [provider]  rizatriptan (MAXALT) 10 MG tablet Take 1 tablet by mouth as needed. 12/10/18   [provider]  sucralfate (CARAFATE) 1 GM/10ML suspension Take 10 mLs (1 g total) by mouth every 6 (six) hours as needed. Patient not taking: Reported on 12/17/2018 12/13/18   Yetta Flock, MD  topiramate (TOPAMAX) 25 MG tablet Take 1 tablet by mouth as directed. 12/12/18   [provider]    Physical Exam: BP (!) 128/89   Pulse 97   Temp 98.2 F (36.8 C) (Oral)   Resp 16   Ht 5' 11"  (1.803 m)   Wt 74.9 kg   SpO2 97%   BMI 23.04 kg/m   Constitutional normal appearing male, no distress Eyes: EOMI, anicteric, normal conjunctivae ENMT: Dry oral mucosa Cardiovascular: RRR no  MRGs, with no peripheral edema Respiratory: Normal respiratory effort, clear breath sounds  Abdomen: Soft,epigastric tenderness, no rebound tenderness or guarding, decreased bowel sounds Skin: No rash ulcers, or lesions. Without skin tenting  Neurologic: Grossly no focal neuro deficit. Psychiatric:Appropriate affect, and mood. Mental status AAOx3          Labs on Admission:  Basic Metabolic Panel: Recent Labs  Lab 07/23/19 2156 07/24/19 0610  NA 141 142  K 4.2 4.6  CL 101 103  CO2 24 24  GLUCOSE 128* 126*  BUN 12 17  CREATININE 0.56* 0.77  CALCIUM 8.6* 8.5*  MG  --  1.5*   Liver Function Tests: Recent Labs  Lab 07/23/19 2156 07/24/19 0610  AST 125* 118*  ALT 127* 117*  ALKPHOS 84 82  BILITOT 0.9 1.1  PROT 6.9 7.0  ALBUMIN 3.6 3.8   Recent Labs  Lab 07/23/19 2156 07/24/19 0610  LIPASE 1,502* 1,188*   No  results for input(s): AMMONIA in the last 168 hours. CBC: Recent Labs  Lab 07/23/19 2156 07/24/19 0610  WBC 5.6 5.1  NEUTROABS  --  4.2  HGB 13.8 14.8  HCT 39.1 44.1  MCV 93.3 97.1  PLT 146* 151   Cardiac Enzymes: No results for input(s): CKTOTAL, CKMB, CKMBINDEX, TROPONINI in the last 168 hours.  BNP (last 3 results) No results for input(s): BNP in the last 8760 hours.  ProBNP (last 3 results) No results for input(s): PROBNP in the last 8760 hours.  CBG: No results for input(s): GLUCAP in the last 168 hours.  Radiological Exams on Admission: CT ABDOMEN PELVIS W CONTRAST  Result Date: 07/23/2019 CLINICAL DATA:  Nausea and vomiting. EXAM: CT ABDOMEN AND PELVIS WITH CONTRAST TECHNIQUE: Multidetector CT imaging of the abdomen and pelvis was performed using the standard protocol following bolus administration of intravenous contrast. CONTRAST:  185m OMNIPAQUE IOHEXOL 300 MG/ML  SOLN COMPARISON:  None. FINDINGS: Lower chest: The lung bases are clear. The heart size is normal. Hepatobiliary: There is decreased hepatic attenuation suggestive of hepatic  steatosis. Normal gallbladder.There is no biliary ductal dilation. Pancreas: There is diffuse peripancreatic fat stranding and free fluid. Approximately 4.6 cm of the pancreatic body and tail is hypoenhancing. Additionally, there is a heterogeneous 2.6 cm area of the pancreatic head and uncinate process that is hypoenhancing. There is no well-formed drainable fluid collection. Spleen: Unremarkable. Adrenals/Urinary Tract: --Adrenal glands: Unremarkable. --Right kidney/ureter: No hydronephrosis or radiopaque kidney stones. --Left kidney/ureter: No hydronephrosis or radiopaque kidney stones. --Urinary bladder: Unremarkable. Stomach/Bowel: --Stomach/Duodenum: No hiatal hernia or other gastric abnormality. Normal duodenal course and caliber. --Small bowel: Unremarkable. --Colon: Unremarkable. --Appendix: Not visualized. No right lower quadrant inflammation or free fluid. Vascular/Lymphatic: Normal course and caliber of the major abdominal vessels. --No retroperitoneal lymphadenopathy. --No mesenteric lymphadenopathy. --No pelvic or inguinal lymphadenopathy. Reproductive: Unremarkable Other: There is free fluid in the abdomen and pelvis the abdominal wall is normal. Musculoskeletal. No acute displaced fractures. IMPRESSION: 1. Findings consistent with acute pancreatitis. There are hypoenhancing areas of the pancreatic tail and head concerning for developing areas of pancreatic necrosis. 2. Hepatic steatosis. 3. Small volume free fluid in the abdomen and pelvis. Electronically Signed   By: CConstance HolsterM.D.   On: 07/23/2019 23:32   DG Chest Portable 1 View  Result Date: 07/23/2019 CLINICAL DATA:  Vomiting, epigastric pain EXAM: PORTABLE CHEST 1 VIEW COMPARISON:  None. FINDINGS: Lungs are clear. No pneumothorax or pleural effusion. Coronary artery stenting has been performed. Cardiac size within normal limits. The pulmonary vascularity is normal. No acute bone abnormality. IMPRESSION: No active disease.  Electronically Signed   By: AFidela SalisburyMD   On: 07/23/2019 23:06    EKG: Independently reviewed. normal EKG, normal sinus rhythm, unchanged from previous tracings.  Assessment/Plan Present on Admission: . Pancreatitis . Intractable nausea and vomiting . Abdominal pain . GERD (gastroesophageal reflux disease) . Elevated LFTs . Elevated troponin . Thrombocytopenia (HCC)  Active Problems:   Pancreatitis   Intractable nausea and vomiting   Abdominal pain   GERD (gastroesophageal reflux disease)   Hiatal hernia   Elevated LFTs   Elevated troponin   Thrombocytopenia (HCC)   Intractable nausea, vomiting and abdominal pain secondary to acute pancreatitis with pancreatic necrosis.  Peripancreatic fat stranding confirmed on CT abdomen, some concern for development of pancreatic necrosis.  Patient admits to social drinking otherwise no previous history of pancreatitis.  Lipase initial presentation, now down trended to 1100.  Has chronically elevated  LFTs, alk phos/T bili within normal limits.  Differential diagnoses for etiology includes cholelithiasis, alcohol induced, idiopathic.   -Abdominal ultrasound, lipid panel to rule out other risk factors -GI consulted given patient's complex GI history and high concern for necrosis development -LR at 50 cc/h, x24-48 hours -IV pain control and IV antiemetics as needed -Patient interested in trying orals, trial clear liquid diet  Chronically elevated LFTs Outpatient evaluation by GI on 12/2018 due to chronic elevation in LFTs to the mid 100s, but during that evaluation LFTs had normalized.  Hepatitis panel that time unremarkable, and negative autoimmune work-up.  Has no elevation in alk phos or T bili, doubt gallbladder/biliary etiology.  Prove suspect related to hepatic steatosis based on previous abdominal ultrasound -Trend CMP -abdominal ultrasound as above  Admitted social drinker.  In talking with GI consultants admits to heavy monitor  drinking 4 days prior to presentation -Monitor CIWA for alcohol withdrawal  HTN Currently normotensive, quite elevated BP prior to transfer to [place] lisinopril on hold unless becomes more elevated  GERD, refractory nature Reports chronic abdominal discomfort EGD on 12/2018 with hiatal hernia.  Adherent to Dexilant and Nexium as outpatient, no longer on sucralfate -IV Protonix here  Elevated troponin presumed secondary to demand ischemia in setting of above, resolved Peaked at 90, now downtrending.  Denies any chest pain.  EKG nonischemic  Thrombocytopenia, acute 146 initially on admission, now 131, previous baseline within normal limits -Monitor CBC  CAD, status post DES. No chest pain.  Nonischemic EKG. -Continue home aspirin once tolerating orals     DVT prophylaxis: SCDs  Code Status: Full code  Family Communication: None  Disposition Plan: Admitted as inpatient for treatment of acute pancreatitis with high concern for necrosis, requiring IV fluids for supportive care, close monitoring due to inability to tolerate oral intake with intractable nausea/vomiting  Consults called: Gastroenterology  Admission status: Inpatient to MedSurg unit      Desiree Hane MD [facility]  Pager [phone]  If 7PM-7AM, please contact night-coverage [email or web address] Password [facility]  07/24/2019, 12:57 PM

## 2019-07-24 NOTE — ED Notes (Addendum)
Pt transported to [facility] via [facility] 

## 2019-07-25 ENCOUNTER — Inpatient Hospital Stay (HOSPITAL_COMMUNITY): Payer: 59

## 2019-07-25 DIAGNOSIS — F10931 Alcohol use, unspecified with withdrawal delirium: Secondary | ICD-10-CM

## 2019-07-25 DIAGNOSIS — K859 Acute pancreatitis without necrosis or infection, unspecified: Secondary | ICD-10-CM

## 2019-07-25 DIAGNOSIS — F10231 Alcohol dependence with withdrawal delirium: Secondary | ICD-10-CM

## 2019-07-25 LAB — CBC
HCT: 38.2 % — ABNORMAL LOW (ref 39.0–52.0)
Hemoglobin: 12.8 g/dL — ABNORMAL LOW (ref 13.0–17.0)
MCH: 32.7 pg (ref 26.0–34.0)
MCHC: 33.5 g/dL (ref 30.0–36.0)
MCV: 97.7 fL (ref 80.0–100.0)
Platelets: UNDETERMINED 10*3/uL (ref 150–400)
RBC: 3.91 MIL/uL — ABNORMAL LOW (ref 4.22–5.81)
RDW: 11.9 % (ref 11.5–15.5)
WBC: 5.1 10*3/uL (ref 4.0–10.5)
nRBC: 0 % (ref 0.0–0.2)

## 2019-07-25 LAB — COMPREHENSIVE METABOLIC PANEL
ALT: 65 U/L — ABNORMAL HIGH (ref 0–44)
AST: 72 U/L — ABNORMAL HIGH (ref 15–41)
Albumin: 2.8 g/dL — ABNORMAL LOW (ref 3.5–5.0)
Alkaline Phosphatase: 50 U/L (ref 38–126)
Anion gap: 9 (ref 5–15)
BUN: 16 mg/dL (ref 6–20)
CO2: 24 mmol/L (ref 22–32)
Calcium: 7.6 mg/dL — ABNORMAL LOW (ref 8.9–10.3)
Chloride: 97 mmol/L — ABNORMAL LOW (ref 98–111)
Creatinine, Ser: 0.7 mg/dL (ref 0.61–1.24)
GFR calc Af Amer: >60 mL/min (ref >60)
GFR calc non Af Amer: >60 mL/min (ref >60)
Glucose, Bld: 118 mg/dL — ABNORMAL HIGH (ref 70–99)
Potassium: 4.5 mmol/L (ref 3.5–5.1)
Sodium: 130 mmol/L — ABNORMAL LOW (ref 135–145)
Total Bilirubin: 1 mg/dL (ref 0.3–1.2)
Total Protein: 5.6 g/dL — ABNORMAL LOW (ref 6.5–8.1)

## 2019-07-25 LAB — LIPASE, BLOOD: Lipase: 623 U/L — ABNORMAL HIGH (ref 11–51)

## 2019-07-25 LAB — MAGNESIUM: Magnesium: 1.2 mg/dL — ABNORMAL LOW (ref 1.7–2.4)

## 2019-07-25 LAB — PHOSPHORUS: Phosphorus: 3.4 mg/dL (ref 2.5–4.6)

## 2019-07-25 MED ORDER — ADULT MULTIVITAMIN W/MINERALS CH
1.0000 | ORAL_TABLET | Freq: Every day | ORAL | Status: DC
  Administered 2019-07-25 – 2019-08-06 (×12): 1 via ORAL
  Filled 2019-07-25 (×12): qty 1

## 2019-07-25 MED ORDER — THIAMINE HCL 100 MG PO TABS
100.0000 mg | ORAL_TABLET | Freq: Every day | ORAL | Status: DC
  Administered 2019-07-25 – 2019-08-06 (×12): 100 mg via ORAL
  Filled 2019-07-25 (×12): qty 1

## 2019-07-25 MED ORDER — LORAZEPAM 1 MG PO TABS
1.0000 mg | ORAL_TABLET | ORAL | Status: AC | PRN
  Administered 2019-07-25: 2 mg via ORAL
  Administered 2019-07-25 (×5): 1 mg via ORAL
  Administered 2019-07-26 (×3): 2 mg via ORAL
  Administered 2019-07-26: 3 mg via ORAL
  Administered 2019-07-27: 2 mg via ORAL
  Filled 2019-07-25: qty 1
  Filled 2019-07-25: qty 3
  Filled 2019-07-25: qty 2
  Filled 2019-07-25: qty 1
  Filled 2019-07-25: qty 2
  Filled 2019-07-25: qty 1
  Filled 2019-07-25 (×2): qty 2
  Filled 2019-07-25 (×3): qty 1
  Filled 2019-07-25: qty 3
  Filled 2019-07-25: qty 2

## 2019-07-25 MED ORDER — LORAZEPAM 2 MG/ML IJ SOLN
1.0000 mg | INTRAMUSCULAR | Status: AC | PRN
  Administered 2019-07-26: 4 mg via INTRAVENOUS
  Administered 2019-07-26 (×2): 2 mg via INTRAVENOUS
  Administered 2019-07-26: 1 mg via INTRAVENOUS
  Administered 2019-07-27 (×2): 4 mg via INTRAVENOUS
  Filled 2019-07-25 (×3): qty 2
  Filled 2019-07-25: qty 1

## 2019-07-25 MED ORDER — THIAMINE HCL 100 MG/ML IJ SOLN
100.0000 mg | Freq: Every day | INTRAMUSCULAR | Status: DC
  Administered 2019-07-26: 100 mg via INTRAVENOUS
  Filled 2019-07-25 (×5): qty 2

## 2019-07-25 MED ORDER — LACTATED RINGERS IV SOLN: INTRAVENOUS | Status: AC

## 2019-07-25 MED ORDER — FOLIC ACID 1 MG PO TABS
1.0000 mg | ORAL_TABLET | Freq: Every day | ORAL | Status: DC
  Administered 2019-07-25 – 2019-08-06 (×12): 1 mg via ORAL
  Filled 2019-07-25 (×12): qty 1

## 2019-07-25 NOTE — Progress Notes (Signed)
[facility]  PROGRESS NOTE  Dolan Xia NWG:[id number] DOB: 1970/09/28 DOA: 07/23/2019 PCP: Patient, No Pcp Per Admit date - 07/23/2019   Admitting Physician Carlton Adam, MD  Outpatient Primary MD for the patient is Patient, No Pcp Per  LOS - 1 Brief Narrative   HPI: Gary Atkinson is a 49 y.o. male with medical history significant for GERD, HTN, chronic elevated LFTs who presents on 07/23/2019 with two days of nausea, vomiting, and abdominal pain. 2 days prior he noticed hotflashes   Noticed hotflashes 2 days ago. Ate chilli at a restaurant with a friend and again felt flushed and sweaty with profuse nausea followed by several episodes ( 6) in car during drive from [place] back to his home in [place]. Then an additional 6 times of emesis with any attempt of oral intake at home. He also endorsed abdominal pain later in the evening located in central of abdomen he used electrotherapy (TENS unit) he applied it to his back and it improved the abdominal pain. He had increased weakness and inability to even go up his stairs at home so he called the ambulance. They gave him IVF and he went to [facility].  Alcohol intake. Social drinker ( playing golf). Not daily. 6 beers a week sometimes ( light beer)  Lost 60 lbs in 6 months; he states it was intentionally in 2020 with changes in diet and increased activity at work. He has been seen at [facility] (Dr. Adela Lank) for self referral for further evaluation of reflux and weight loss (213 lbs in March and 169 during  pounds on 12 on 12/13/2018.  At that time states weight loss related to decreased appetite and early satiety; he admitted to inability to obtain protonix which previously helped because of loss of insurance in March.  He was noted to have chronically elevated LFTs. Hepatitis panel was negative and autoimmune workup was negative  He had EGD on 12/2018 which showed small hiatal hernia and mild erythema of stomach. Pathology was  negative  Denies any fevers or chills. No chest pain. No changes in medications.  Diarrhea--yesterday. ,loose stool x 1.   Acid reflux. On PPI(dexilant and nexium) on carafate. Says not well controlled. Has acidic taste in mouth. Abdominal discomfort. Hasn't filled carafate in a while  ED Course: Afebrile, BP 166/105, troponin trend 16--52, COVID neg, Lipase 1,502. AST 125, ALT 127, plt 146.  CT abd consistnet with acute pancreatitis, and concerning area of developing pancreatitic necrosis, hepatic steatosis.  Given IV protonix, IVF, IV morphine for pain control at [facility] before transfer to [facility].    Subjective  Today reports body pain still persists, has not worsened.  Tolerated sips of clear liquid diet yesterday.  No nausea or vomiting.  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  MRCP confirms, no signs of infected necrosis given remains afebrile with normal white count, hemodynamically stable.  Lipid panel unremarkable.  Right upper quadrant ultrasound negative for gallstones or biliary duct dilatation.  Patient admits to alcohol binge prior to admission, previous work-up for autoimmune etiologies negative on 12/2018.  Tolerating sips of clears without nausea or vomiting, abdominal pain persist -Continue to monitor on clear liquid diet -Continue IV Dilaudid every 2 hours severe pain, and oral oxycodone as tolerated -IV antiemetics as needed -Incentive spirometry -Appreciate GI recommendations -Monitor on CIWA protocol given high concern for alcohol withdrawal during hospitalization -Monitor CMP, CBC  Alcohol abuse with withdrawal.  Tremors on exam, no hallucinations,  no tachycardia or tachypnea.  Recent binge drinking prior to admission.  Seems to have some tremors on exam, no tachycardia, unclear tremors related more to pain for potential alcohol withdrawal. -Monitor on CIWA protocol, Ativan as needed in place -Thiamine, multivitamin, folic  acid  Transaminitis, downtrending.  No biliary involvement on abdominal ultrasound/MRCP.  Did show hepatic steatosis, possible and/or resolving alcoholic hepatitis -Trend CMP  HTN.  SBP slightly elevated in the 150s -Hold home lisinopril on admission due to diminished oral intake, continue to monitor -IV hydralazine as needed  Mood disorder, stable -continue home BuSpar, Luvox  GERD, stable -IV PPI  Elevated troponin secondary to demand ischemia, resolved Peaked at 90, has not downtrending, EKG nonischemic without chest pain.    CAD s/p DES Without chest pain, nonischemic EKG -Continue home aspirin once tolerating orals  Thrombocytopenia, acute Could be related to hepatic steatosis.  No signs or symptoms of bleeding -Monitor CBC  Family Communication  : None  Code Status : Full  Disposition Plan  :  Patient is from home. Anticipated d/c date:  Greater than 3 days. Barriers to d/c or necessity for inpatient status:  Needs very close monitoring given the severity of his pancreatitis as evident by imaging, clinically remains very stable, also high concern for active alcohol withdrawal, needs close monitoring while on Ativan protocol Consults  : GI  Procedures  : MRCP, 7/22  DVT Prophylaxis  :  SCDs   Lab Results  Component Value Date   PLT PLATELET CLUMPS NOTED ON SMEAR, UNABLE TO ESTIMATE 07/25/2019    Diet :  Diet Order            Diet clear liquid Room service appropriate? Yes; Fluid consistency: Thin  Diet effective now                  Inpatient Medications Scheduled Meds: . busPIRone  20 mg Oral BID  . fluvoxaMINE  150 mg Oral BID  . folic acid  1 mg Oral Daily  . multivitamin with minerals  1 tablet Oral Daily  . pantoprazole (PROTONIX) IV  40 mg Intravenous Q24H  . thiamine  100 mg Oral Daily   Or  . thiamine  100 mg Intravenous Daily   Continuous Infusions: . lactated ringers 150 mL/hr at 07/24/19 2304  . lactated ringers 150 mL/hr at 07/25/19  1213   PRN Meds:.acetaminophen **OR** acetaminophen, HYDROmorphone (DILAUDID) injection, LORazepam **OR** LORazepam, ondansetron **OR** ondansetron (ZOFRAN) IV, oxyCODONE  Antibiotics  :   Anti-infectives (From admission, onward)   None       Objective   Vitals:   07/24/19 1546 07/24/19 1559 07/25/19 0522 07/25/19 1300  BP: (!) 151/105 (!) 151/91 (!) 155/97 (!) 150/92  Pulse: (!) 110 99 99 101  Resp: 19  17 20   Temp: 98.3 F (36.8 C)  99.3 F (37.4 C) 99 F (37.2 C)  TempSrc: Oral  Oral Oral  SpO2: 99%  93% 94%  Weight:      Height:        SpO2: 94 %  Wt Readings from Last 3 Encounters:  07/24/19 74.9 kg  12/17/18 76.7 kg  12/13/18 76.8 kg     Intake/Output Summary (Last 24 hours) at 07/25/2019 1401 Last data filed at 07/25/2019 0941 Gross per 24 hour  Intake 2092.5 ml  Output 250 ml  Net 1842.5 ml    Physical Exam:     Awake Alert, Oriented X 3, anxious affect, tremulous no new F.N deficits,  [place].AT, Normal respiratory effort on room air, CTAB RRR,No Gallops,Rubs or new Murmurs,  Decreased bowel sounds, Abd Soft, tenderness epigastric/lower quadrants, no rebound tenderness, no guarding No Cyanosis, No new Rash or bruise     I have personally reviewed the following:   Data Reviewed:  CBC Recent Labs  Lab 07/23/19 2156 07/24/19 0610 07/25/19 0517  WBC 5.6 5.1 5.1  HGB 13.8 14.8 12.8*  HCT 39.1 44.1 38.2*  PLT 146* 151 PLATELET CLUMPS NOTED ON SMEAR, UNABLE TO ESTIMATE  MCV 93.3 97.1 97.7  MCH 32.9 32.6 32.7  MCHC 35.3 33.6 33.5  RDW 11.4* 11.9 11.9  LYMPHSABS  --  0.3*  --   MONOABS  --  0.6  --   EOSABS  --  0.0  --   BASOSABS  --  0.0  --     Chemistries  Recent Labs  Lab 07/23/19 2156 07/24/19 0610 07/25/19 0517  NA 141 142 130*  K 4.2 4.6 4.5  CL 101 103 97*  CO2 24 24 24   GLUCOSE 128* 126* 118*  BUN 12 17 16   CREATININE 0.56* 0.77 0.70  CALCIUM 8.6* 8.5* 7.6*  MG  --  1.5* 1.2*  AST 125* 118* 72*  ALT 127* 117* 65*   ALKPHOS 84 82 50  BILITOT 0.9 1.1 1.0   ------------------------------------------------------------------------------------------------------------------ Recent Labs    07/24/19 0610  CHOL 239*  HDL 92  LDLCALC 132*  TRIG 74  CHOLHDL 2.6    No results found for: HGBA1C ------------------------------------------------------------------------------------------------------------------ No results for input(s): TSH, T4TOTAL, T3FREE, THYROIDAB in the last 72 hours.  Invalid input(s): FREET3 ------------------------------------------------------------------------------------------------------------------ No results for input(s): VITAMINB12, FOLATE, FERRITIN, TIBC, IRON, RETICCTPCT in the last 72 hours.  Coagulation profile No results for input(s): INR, PROTIME in the last 168 hours.  No results for input(s): DDIMER in the last 72 hours.  Cardiac Enzymes No results for input(s): CKMB, TROPONINI, MYOGLOBIN in the last 168 hours.  Invalid input(s): CK ------------------------------------------------------------------------------------------------------------------ No results found for: BNP  Micro Results Recent Results (from the past 240 hour(s))  SARS Coronavirus 2 by RT PCR (hospital order, performed in [facility] hospital lab) Nasopharyngeal Nasopharyngeal Swab     Status: None   Collection Time: 07/24/19 12:08 AM   Specimen: Nasopharyngeal Swab  Result Value Ref Range Status   SARS Coronavirus 2 NEGATIVE NEGATIVE Final    Comment: (NOTE) SARS-CoV-2 target nucleic acids are NOT DETECTED.  The SARS-CoV-2 RNA is generally detectable in upper and lower respiratory specimens during the acute phase of infection. The lowest concentration of SARS-CoV-2 viral copies this assay can detect is 250 copies / mL. A negative result does not preclude SARS-CoV-2 infection and should not be used as the sole basis for treatment or other patient management decisions.  A negative result may  occur with improper specimen collection / handling, submission of specimen other than nasopharyngeal swab, presence of viral mutation(s) within the areas targeted by this assay, and inadequate number of viral copies (<250 copies / mL). A negative result must be combined with clinical observations, patient history, and epidemiological information.  Fact Sheet for Patients:   [facility]  Fact Sheet for Healthcare Providers: 07/26/19  This test is not yet approved or  cleared by the [employer] FDA and has been authorized for detection and/or diagnosis of SARS-CoV-2 by FDA under an Emergency Use Authorization (EUA).  This EUA will remain in effect (meaning this test can be used) for the duration of the COVID-19 declaration under Section 564(b)(1)  of the Act, 21 U.S.C. section 360bbb-3(b)(1), unless the authorization is terminated or revoked sooner.  Performed at [facility], [address], [place], [place] [address]     Radiology Reports CT ABDOMEN PELVIS W CONTRAST  Result Date: 07/23/2019 CLINICAL DATA:  Nausea and vomiting. EXAM: CT ABDOMEN AND PELVIS WITH CONTRAST TECHNIQUE: Multidetector CT imaging of the abdomen and pelvis was performed using the standard protocol following bolus administration of intravenous contrast. CONTRAST:  OMNIPAQUE IOHEXOL 300 MG/ML  SOLN COMPARISON:  None. FINDINGS: Lower chest: The lung bases are clear. The heart size is normal. Hepatobiliary: There is decreased hepatic attenuation suggestive of hepatic steatosis. Normal gallbladder.There is no biliary ductal dilation. Pancreas: There is diffuse peripancreatic fat stranding and free fluid. Approximately 4.6 cm of the pancreatic body and tail is hypoenhancing. Additionally, there is a heterogeneous 2.6 cm area of the pancreatic head and uncinate process that is hypoenhancing. There is no well-formed drainable fluid  collection. Spleen: Unremarkable. Adrenals/Urinary Tract: --Adrenal glands: Unremarkable. --Right kidney/ureter: No hydronephrosis or radiopaque kidney stones. --Left kidney/ureter: No hydronephrosis or radiopaque kidney stones. --Urinary bladder: Unremarkable. Stomach/Bowel: --Stomach/Duodenum: No hiatal hernia or other gastric abnormality. Normal duodenal course and caliber. --Small bowel: Unremarkable. --Colon: Unremarkable. --Appendix: Not visualized. No right lower quadrant inflammation or free fluid. Vascular/Lymphatic: Normal course and caliber of the major abdominal vessels. --No retroperitoneal lymphadenopathy. --No mesenteric lymphadenopathy. --No pelvic or inguinal lymphadenopathy. Reproductive: Unremarkable Other: There is free fluid in the abdomen and pelvis the abdominal wall is normal. Musculoskeletal. No acute displaced fractures. IMPRESSION: 1. Findings consistent with acute pancreatitis. There are hypoenhancing areas of the pancreatic tail and head concerning for developing areas of pancreatic necrosis. 2. Hepatic steatosis. 3. Small volume free fluid in the abdomen and pelvis. Electronically Signed   By: Katherine Mantle M.D.   On: 07/23/2019 23:32   MR 3D Recon At Scanner  Result Date: 07/25/2019 CLINICAL DATA:  Inpatient. Severe acute pancreatitis with elevated liver function tests. EXAM: MRI ABDOMEN WITHOUT AND WITH CONTRAST (INCLUDING MRCP) TECHNIQUE: Multiplanar multisequence MR imaging of the abdomen was performed both before and after the administration of intravenous contrast. Heavily T2-weighted images of the biliary and pancreatic ducts were obtained, and three-dimensional MRCP images were rendered by post processing. CONTRAST:  8mL GADAVIST GADOBUTROL 1 MMOL/ML IV SOLN COMPARISON:  07/23/2019 CT abdomen/pelvis. FINDINGS: Lower chest: Small dependent bilateral pleural effusions, new. Patulous lower thoracic esophagus with air-fluid level suggesting esophageal dysmotility and/or  gastroesophageal reflux. Hepatobiliary: Top-normal liver size. No definite liver surface irregularity. Prominent diffuse hepatic steatosis. No liver mass. Normal gallbladder with no cholelithiasis. No biliary ductal dilatation. Common bile duct diameter 2 mm. No choledocholithiasis. No biliary masses, strictures or beading. Pancreas: Diffusely thickened and edematous pancreatic parenchyma with marked diffuse peripancreatic fat stranding and ill-defined fluid extending into anterior and posterior paranephric retroperitoneal spaces bilaterally, compatible with acute pancreatitis. There are large regions of the pancreatic head and tail demonstrating patchy T1 hyperintensity and absence of enhancement, compatible with hemorrhagic necrotizing pancreatitis, involving approximately 50% of the pancreatic parenchyma. No pancreatic duct dilation. No discrete pancreatic mass. No measurable peripancreatic fluid collections. Spleen: Normal size. No mass. Adrenals/Urinary Tract: Normal adrenals. No hydronephrosis. Normal kidneys with no renal mass. Stomach/Bowel: Small hiatal hernia. Otherwise normal nondistended stomach. Visualized small and large bowel is normal caliber, with no bowel wall thickening. Vascular/Lymphatic: Normal caliber abdominal aorta. Patent portal, splenic, hepatic and renal veins. No pathologically enlarged lymph nodes in the abdomen. Other: Small volume abdominal ascites.  No focal fluid collection. Musculoskeletal: No aggressive appearing focal osseous lesions. IMPRESSION: 1. Acute hemorrhagic necrotizing pancreatitis involving approximately 50% of the pancreatic parenchyma. No discrete measurable peripancreatic fluid collections. 2. No biliary ductal dilatation. No cholelithiasis or choledocholithiasis. 3. Prominent diffuse hepatic steatosis. 4. Small dependent bilateral pleural effusions, new. Small volume abdominal ascites. 5. Patulous lower thoracic esophagus with air-fluid level suggesting esophageal  dysmotility and/or gastroesophageal reflux. Electronically Signed   By: Delbert Phenix M.D.   On: 07/25/2019 07:54   DG Chest Portable 1 View  Result Date: 07/23/2019 CLINICAL DATA:  Vomiting, epigastric pain EXAM: PORTABLE CHEST 1 VIEW COMPARISON:  None. FINDINGS: Lungs are clear. No pneumothorax or pleural effusion. Coronary artery stenting has been performed. Cardiac size within normal limits. The pulmonary vascularity is normal. No acute bone abnormality. IMPRESSION: No active disease. Electronically Signed   By: Helyn Numbers MD   On: 07/23/2019 23:06   MR ABDOMEN MRCP W WO CONTAST  Result Date: 07/25/2019 CLINICAL DATA:  Inpatient. Severe acute pancreatitis with elevated liver function tests. EXAM: MRI ABDOMEN WITHOUT AND WITH CONTRAST (INCLUDING MRCP) TECHNIQUE: Multiplanar multisequence MR imaging of the abdomen was performed both before and after the administration of intravenous contrast. Heavily T2-weighted images of the biliary and pancreatic ducts were obtained, and three-dimensional MRCP images were rendered by post processing. CONTRAST:  87mL GADAVIST GADOBUTROL 1 MMOL/ML IV SOLN COMPARISON:  07/23/2019 CT abdomen/pelvis. FINDINGS: Lower chest: Small dependent bilateral pleural effusions, new. Patulous lower thoracic esophagus with air-fluid level suggesting esophageal dysmotility and/or gastroesophageal reflux. Hepatobiliary: Top-normal liver size. No definite liver surface irregularity. Prominent diffuse hepatic steatosis. No liver mass. Normal gallbladder with no cholelithiasis. No biliary ductal dilatation. Common bile duct diameter 2 mm. No choledocholithiasis. No biliary masses, strictures or beading. Pancreas: Diffusely thickened and edematous pancreatic parenchyma with marked diffuse peripancreatic fat stranding and ill-defined fluid extending into anterior and posterior paranephric retroperitoneal spaces bilaterally, compatible with acute pancreatitis. There are large regions of the  pancreatic head and tail demonstrating patchy T1 hyperintensity and absence of enhancement, compatible with hemorrhagic necrotizing pancreatitis, involving approximately 50% of the pancreatic parenchyma. No pancreatic duct dilation. No discrete pancreatic mass. No measurable peripancreatic fluid collections. Spleen: Normal size. No mass. Adrenals/Urinary Tract: Normal adrenals. No hydronephrosis. Normal kidneys with no renal mass. Stomach/Bowel: Small hiatal hernia. Otherwise normal nondistended stomach. Visualized small and large bowel is normal caliber, with no bowel wall thickening. Vascular/Lymphatic: Normal caliber abdominal aorta. Patent portal, splenic, hepatic and renal veins. No pathologically enlarged lymph nodes in the abdomen. Other: Small volume abdominal ascites.  No focal fluid collection. Musculoskeletal: No aggressive appearing focal osseous lesions. IMPRESSION: 1. Acute hemorrhagic necrotizing pancreatitis involving approximately 50% of the pancreatic parenchyma. No discrete measurable peripancreatic fluid collections. 2. No biliary ductal dilatation. No cholelithiasis or choledocholithiasis. 3. Prominent diffuse hepatic steatosis. 4. Small dependent bilateral pleural effusions, new. Small volume abdominal ascites. 5. Patulous lower thoracic esophagus with air-fluid level suggesting esophageal dysmotility and/or gastroesophageal reflux. Electronically Signed   By: Delbert Phenix M.D.   On: 07/25/2019 07:54   US Abdomen Limited RUQ  Result Date: 07/25/2019 CLINICAL DATA:  Pancreatitis. EXAM: ULTRASOUND ABDOMEN LIMITED RIGHT UPPER QUADRANT COMPARISON:  Abdominal MRI 07/24/2019 FINDINGS: Gallbladder: No gallstones or wall thickening visualized. No sonographic Murphy sign noted by sonographer. Common bile duct: Diameter: 6 mm Liver: Diffusely increased parenchymal echogenicity without a focal lesion identified. Portal vein is patent on color Doppler imaging with normal direction of blood flow towards  the liver. Other: Small  volume perihepatic ascites and right pleural effusion. IMPRESSION: 1. Hepatic steatosis. 2. No gallstones or biliary dilatation. 3. Small volume perihepatic ascites and right pleural effusion. Electronically Signed   By: Sebastian Ache M.D.   On: 07/25/2019 09:22     Time Spent in minutes  30     Laverna Peace M.D on 07/25/2019 at 2:01 PM  To page go to [email or web address] - password [email or web address]

## 2019-07-25 NOTE — Progress Notes (Addendum)
Patient ID: Gary Atkinson, male   DOB: 1970/10/13, 49 y.o.   MRN: [id number]    Progress Note   Subjective   day # 2  CC' acute pancreatitis  Lipase 623  WBC 5.1, hgb 12.8  Na 130/creat 0.7 AST 72/ALT 65/tbili 1.0 MG 1.2  CA  7.6  Patient says he feels about the same, no improvement in abdominal pain, but no worsening.  He relates that he is having to wait a long time to get doses of pain medicines at times.  No nausea or vomiting and asking about starting clear liquids  Patient is tremulous, admitting that his nerves are not doing well, last EtOH was 5 days ago.  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 He feels it is difficult to take a deep breath due to the upper abdominal pain and bloating.  No cough or sputum production   Objective   Vital signs in last 24 hours: Temp:  [98.2 F (36.8 C)-99.3 F (37.4 C)] 99.3 F (37.4 C) (07/23 0522) Pulse Rate:  [97-110] 99 (07/23 0522) Resp:  [16-19] 17 (07/23 0522) BP: (128-155)/(89-110) 155/97 (07/23 0522) SpO2:  [93 %-99 %] 93 % (07/23 0522) Last BM Date: 07/23/19 General:    white male in NAD, tremulous Heart:  Regular rate and rhythm; no murmurs Lungs: Respirations even and unlabored, lungs CTA bilaterally Abdomen:  Soft, tender across the upper abdomen, some guarding no rebound nondistended. Normal bowel sounds. Extremities:  Without edema. Neurologic:  Alert and oriented,  grossly normal neurologically.  Tremulous, anxious appearing Psych:  Cooperative. Normal mood and affect.  Intake/Output from previous day: 07/22 0701 - 07/23 0700 In: 1872.5 [P.O.:850; I.V.:1022.5] Out: 250 [Urine:250] Intake/Output this shift: No  intake/output data recorded.  Lab Results: Recent Labs    07/23/19 2156 07/24/19 0610 07/25/19 0517  WBC 5.6 5.1 5.1  HGB 13.8 14.8 12.8*  HCT 39.1 44.1 38.2*  PLT 146* 151 PLATELET CLUMPS NOTED ON SMEAR, UNABLE TO ESTIMATE   BMET Recent Labs    07/23/19 2156 07/24/19 0610 07/25/19 0517  NA 141 142 130*  K 4.2 4.6 4.5  CL 101 103 97*  CO2 24 24 24   GLUCOSE 128* 126* 118*  BUN 12 17 16   CREATININE 0.56* 0.77 0.70  CALCIUM 8.6* 8.5* 7.6*   LFT Recent Labs    07/25/19 0517  PROT 5.6*  ALBUMIN 2.8*  AST 72*  ALT 65*  ALKPHOS 50  BILITOT 1.0   PT/INR No results for input(s): LABPROT, INR in the last 72 hours.  Studies/Results: CT ABDOMEN PELVIS W CONTRAST  Result Date: 07/23/2019 CLINICAL DATA:  Nausea and vomiting. EXAM: CT ABDOMEN AND PELVIS WITH CONTRAST TECHNIQUE: Multidetector CT imaging of the abdomen and pelvis was performed using the standard protocol following bolus administration of intravenous contrast. CONTRAST:  07/27/19 OMNIPAQUE IOHEXOL 300 MG/ML  SOLN COMPARISON:  None. FINDINGS: Lower chest: The lung bases are clear. The heart size is normal. Hepatobiliary: There is decreased hepatic attenuation suggestive of hepatic steatosis. Normal gallbladder.There is no biliary ductal dilation. Pancreas: There is diffuse peripancreatic fat stranding and free fluid. Approximately 4.6 cm of the pancreatic body and tail is hypoenhancing. Additionally, there is a heterogeneous 2.6 cm area of the pancreatic head and uncinate process that is hypoenhancing. There is no  well-formed drainable fluid collection. Spleen: Unremarkable. Adrenals/Urinary Tract: --Adrenal glands: Unremarkable. --Right kidney/ureter: No hydronephrosis or radiopaque kidney stones. --Left kidney/ureter: No hydronephrosis or radiopaque kidney stones. --Urinary bladder: Unremarkable. Stomach/Bowel: --Stomach/Duodenum: No hiatal hernia or other gastric abnormality. Normal duodenal course and caliber. --Small  bowel: Unremarkable. --Colon: Unremarkable. --Appendix: Not visualized. No right lower quadrant inflammation or free fluid. Vascular/Lymphatic: Normal course and caliber of the major abdominal vessels. --No retroperitoneal lymphadenopathy. --No mesenteric lymphadenopathy. --No pelvic or inguinal lymphadenopathy. Reproductive: Unremarkable Other: There is free fluid in the abdomen and pelvis the abdominal wall is normal. Musculoskeletal. No acute displaced fractures. IMPRESSION: 1. Findings consistent with acute pancreatitis. There are hypoenhancing areas of the pancreatic tail and head concerning for developing areas of pancreatic necrosis. 2. Hepatic steatosis. 3. Small volume free fluid in the abdomen and pelvis. Electronically Signed   By: Katherine Mantle M.D.   On: 07/23/2019 23:32   MR 3D Recon At Scanner  Result Date: 07/25/2019 CLINICAL DATA:  Inpatient. Severe acute pancreatitis with elevated liver function tests. EXAM: MRI ABDOMEN WITHOUT AND WITH CONTRAST (INCLUDING MRCP) TECHNIQUE: Multiplanar multisequence MR imaging of the abdomen was performed both before and after the administration of intravenous contrast. Heavily T2-weighted images of the biliary and pancreatic ducts were obtained, and three-dimensional MRCP images were rendered by post processing. CONTRAST:  92mL GADAVIST GADOBUTROL 1 MMOL/ML IV SOLN COMPARISON:  07/23/2019 CT abdomen/pelvis. FINDINGS: Lower chest: Small dependent bilateral pleural effusions, new. Patulous lower thoracic esophagus with air-fluid level suggesting esophageal dysmotility and/or gastroesophageal reflux. Hepatobiliary: Top-normal liver size. No definite liver surface irregularity. Prominent diffuse hepatic steatosis. No liver mass. Normal gallbladder with no cholelithiasis. No biliary ductal dilatation. Common bile duct diameter 2 mm. No choledocholithiasis. No biliary masses, strictures or beading. Pancreas: Diffusely thickened and edematous pancreatic parenchyma  with marked diffuse peripancreatic fat stranding and ill-defined fluid extending into anterior and posterior paranephric retroperitoneal spaces bilaterally, compatible with acute pancreatitis. There are large regions of the pancreatic head and tail demonstrating patchy T1 hyperintensity and absence of enhancement, compatible with hemorrhagic necrotizing pancreatitis, involving approximately 50% of the pancreatic parenchyma. No pancreatic duct dilation. No discrete pancreatic mass. No measurable peripancreatic fluid collections. Spleen: Normal size. No mass. Adrenals/Urinary Tract: Normal adrenals. No hydronephrosis. Normal kidneys with no renal mass. Stomach/Bowel: Small hiatal hernia. Otherwise normal nondistended stomach. Visualized small and large bowel is normal caliber, with no bowel wall thickening. Vascular/Lymphatic: Normal caliber abdominal aorta. Patent portal, splenic, hepatic and renal veins. No pathologically enlarged lymph nodes in the abdomen. Other: Small volume abdominal ascites.  No focal fluid collection. Musculoskeletal: No aggressive appearing focal osseous lesions. IMPRESSION: 1. Acute hemorrhagic necrotizing pancreatitis involving approximately 50% of the pancreatic parenchyma. No discrete measurable peripancreatic fluid collections. 2. No biliary ductal dilatation. No cholelithiasis or choledocholithiasis. 3. Prominent diffuse hepatic steatosis. 4. Small dependent bilateral pleural effusions, new. Small volume abdominal ascites. 5. Patulous lower thoracic esophagus with air-fluid level suggesting esophageal dysmotility and/or gastroesophageal reflux. Electronically Signed   By: Delbert Phenix M.D.   On: 07/25/2019 07:54   DG Chest Portable 1 View  Result Date: 07/23/2019 CLINICAL DATA:  Vomiting, epigastric pain EXAM: PORTABLE CHEST 1 VIEW COMPARISON:  None. FINDINGS: Lungs are clear. No pneumothorax or pleural effusion. Coronary artery stenting has been performed. Cardiac size within  normal limits. The pulmonary vascularity is normal. No acute bone abnormality. IMPRESSION: No active disease. Electronically Signed   By: Helyn Numbers MD   On: 07/23/2019 23:06   MR ABDOMEN MRCP W  WO CONTAST  Result Date: 07/25/2019 CLINICAL DATA:  Inpatient. Severe acute pancreatitis with elevated liver function tests. EXAM: MRI ABDOMEN WITHOUT AND WITH CONTRAST (INCLUDING MRCP) TECHNIQUE: Multiplanar multisequence MR imaging of the abdomen was performed both before and after the administration of intravenous contrast. Heavily T2-weighted images of the biliary and pancreatic ducts were obtained, and three-dimensional MRCP images were rendered by post processing. CONTRAST:  8mL GADAVIST GADOBUTROL 1 MMOL/ML IV SOLN COMPARISON:  07/23/2019 CT abdomen/pelvis. FINDINGS: Lower chest: Small dependent bilateral pleural effusions, new. Patulous lower thoracic esophagus with air-fluid level suggesting esophageal dysmotility and/or gastroesophageal reflux. Hepatobiliary: Top-normal liver size. No definite liver surface irregularity. Prominent diffuse hepatic steatosis. No liver mass. Normal gallbladder with no cholelithiasis. No biliary ductal dilatation. Common bile duct diameter 2 mm. No choledocholithiasis. No biliary masses, strictures or beading. Pancreas: Diffusely thickened and edematous pancreatic parenchyma with marked diffuse peripancreatic fat stranding and ill-defined fluid extending into anterior and posterior paranephric retroperitoneal spaces bilaterally, compatible with acute pancreatitis. There are large regions of the pancreatic head and tail demonstrating patchy T1 hyperintensity and absence of enhancement, compatible with hemorrhagic necrotizing pancreatitis, involving approximately 50% of the pancreatic parenchyma. No pancreatic duct dilation. No discrete pancreatic mass. No measurable peripancreatic fluid collections. Spleen: Normal size. No mass. Adrenals/Urinary Tract: Normal adrenals. No  hydronephrosis. Normal kidneys with no renal mass. Stomach/Bowel: Small hiatal hernia. Otherwise normal nondistended stomach. Visualized small and large bowel is normal caliber, with no bowel wall thickening. Vascular/Lymphatic: Normal caliber abdominal aorta. Patent portal, splenic, hepatic and renal veins. No pathologically enlarged lymph nodes in the abdomen. Other: Small volume abdominal ascites.  No focal fluid collection. Musculoskeletal: No aggressive appearing focal osseous lesions. IMPRESSION: 1. Acute hemorrhagic necrotizing pancreatitis involving approximately 50% of the pancreatic parenchyma. No discrete measurable peripancreatic fluid collections. 2. No biliary ductal dilatation. No cholelithiasis or choledocholithiasis. 3. Prominent diffuse hepatic steatosis. 4. Small dependent bilateral pleural effusions, new. Small volume abdominal ascites. 5. Patulous lower thoracic esophagus with air-fluid level suggesting esophageal dysmotility and/or gastroesophageal reflux. Electronically Signed   By: Delbert PhenixJason A Poff M.D.   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 MRI/MRCP shows no evidence for choledocholithiasis.  At present no significant phlegmon or pseudocysts  No evidence currently of infected necrosis-patient has been afebrile, normal WBC  Hemodynamically he has remained stable with no worrisome parameters   #2 transaminitis improving-underlying hepatic steatosis, he may have a component of [place], suspect acute transaminitis secondary to EtOH and pancreatitis.  #3  EtOH abuse-suspect more chronic #4 GERD  Plan: Continue liberal volume replacement as doing  Sips of clear liquids Pain management-if unable to manage pain well over the next 24 hours could consider PCA  Replace Mg and Calcium  Start EtOH withdrawal protocol  No indication for IV antibiotics at present Expect he  will need repeat imaging in 5 to 7 days.  We will continue to follow with you    Active Problems:   Pancreatitis   Intractable nausea and vomiting   Abdominal pain   GERD (gastroesophageal reflux disease)   Hiatal hernia   Elevated LFTs   Elevated troponin   Thrombocytopenia (HCC)     LOS: 1 day   Amy Esterwood PA-C 07/25/2019, 8:44 AM   I have discussed the case with the PA, and that is the plan I formulated. I personally interviewed and examined the patient.  Severe acute alcoholic pancreatitis with elevated LFTs.  No choledocholithiasis. Fortunately, his clinical appearance is better than the radiographic appearance of his pancreatitis. Nevertheless, he is in for a prolonged hospitalization, as I expect this recovery to be slow.  Despite the degree of pancreatic necrosis seen on imaging, it would not be infected this early in the course and I do not think he needs antibiotics at this point.  Probable reimaging later in this hospital stay depending on his clinical progress.  I agree he appears to be in alcohol withdrawal, and the hospitalist has been attentive to that.  Continue close monitoring and supportive care.  We will follow.  Total time 25 minutes  Charlie Pitter III Office: [phone]

## 2019-07-25 NOTE — Plan of Care (Signed)
Pt BP mildly elevated this am.  PRN pain meds administered.  Pt currently resting but reports pain at 6/10 at this time.   Problem: Education: Goal: Knowledge of General Education information will improve Description: Including pain rating scale, medication(s)/side effects and non-pharmacologic comfort measures Outcome: Progressing   Problem: Health Behavior/Discharge Planning: Goal: Ability to manage health-related needs will improve Outcome: Progressing   Problem: Clinical Measurements: Goal: Ability to maintain clinical measurements within normal limits will improve Outcome: Progressing Goal: Will remain free from infection Outcome: Progressing Goal: Diagnostic test results will improve Outcome: Progressing Goal: Respiratory complications will improve Outcome: Progressing Goal: Cardiovascular complication will be avoided Outcome: Progressing   Problem: Activity: Goal: Risk for activity intolerance will decrease Outcome: Progressing   Problem: Nutrition: Goal: Adequate nutrition will be maintained Outcome: Progressing   Problem: Coping: Goal: Level of anxiety will decrease Outcome: Progressing   Problem: Elimination: Goal: Will not experience complications related to bowel motility Outcome: Progressing Goal: Will not experience complications related to urinary retention Outcome: Progressing   Problem: Safety: Goal: Ability to remain free from injury will improve Outcome: Progressing   Problem: Skin Integrity: Goal: Risk for impaired skin integrity will decrease Outcome: Progressing   Problem: Education: Goal: Knowledge of Pancreatitis treatment and prevention will improve Outcome: Progressing   Problem: Health Behavior/Discharge Planning: Goal: Ability to formulate a plan to maintain an alcohol-free life will improve Outcome: Progressing   Problem: Nutritional: Goal: Ability to achieve adequate nutritional intake will improve Outcome: Progressing    Problem: Clinical Measurements: Goal: Complications related to the disease process, condition or treatment will be avoided or minimized Outcome: Progressing   Problem: Pain Managment: Goal: General experience of comfort will improve Outcome: Not Progressing

## 2019-07-26 DIAGNOSIS — F10139 Alcohol abuse with withdrawal, unspecified: Secondary | ICD-10-CM

## 2019-07-26 DIAGNOSIS — E876 Hypokalemia: Secondary | ICD-10-CM

## 2019-07-26 LAB — CBC
HCT: 28.6 % — ABNORMAL LOW (ref 39.0–52.0)
Hemoglobin: 9.9 g/dL — ABNORMAL LOW (ref 13.0–17.0)
MCH: 33.1 pg (ref 26.0–34.0)
MCHC: 34.6 g/dL (ref 30.0–36.0)
MCV: 95.7 fL (ref 80.0–100.0)
Platelets: 83 10*3/uL — ABNORMAL LOW (ref 150–400)
RBC: 2.99 MIL/uL — ABNORMAL LOW (ref 4.22–5.81)
RDW: 11.7 % (ref 11.5–15.5)
WBC: 3.8 10*3/uL — ABNORMAL LOW (ref 4.0–10.5)
nRBC: 0 % (ref 0.0–0.2)

## 2019-07-26 LAB — COMPREHENSIVE METABOLIC PANEL
ALT: 48 U/L — ABNORMAL HIGH (ref 0–44)
AST: 61 U/L — ABNORMAL HIGH (ref 15–41)
Albumin: 2.7 g/dL — ABNORMAL LOW (ref 3.5–5.0)
Alkaline Phosphatase: 43 U/L (ref 38–126)
Anion gap: 9 (ref 5–15)
BUN: 10 mg/dL (ref 6–20)
CO2: 27 mmol/L (ref 22–32)
Calcium: 7.4 mg/dL — ABNORMAL LOW (ref 8.9–10.3)
Chloride: 93 mmol/L — ABNORMAL LOW (ref 98–111)
Creatinine, Ser: 0.77 mg/dL (ref 0.61–1.24)
GFR calc Af Amer: >60 mL/min (ref >60)
GFR calc non Af Amer: >60 mL/min (ref >60)
Glucose, Bld: 89 mg/dL (ref 70–99)
Potassium: 3.2 mmol/L — ABNORMAL LOW (ref 3.5–5.1)
Sodium: 129 mmol/L — ABNORMAL LOW (ref 135–145)
Total Bilirubin: 1.1 mg/dL (ref 0.3–1.2)
Total Protein: 5.5 g/dL — ABNORMAL LOW (ref 6.5–8.1)

## 2019-07-26 LAB — LIPASE, BLOOD: Lipase: 155 U/L — ABNORMAL HIGH (ref 11–51)

## 2019-07-26 MED ORDER — CHLORHEXIDINE GLUCONATE CLOTH 2 % EX PADS
6.0000 | MEDICATED_PAD | Freq: Every day | CUTANEOUS | Status: DC
  Administered 2019-07-26 – 2019-08-01 (×7): 6 via TOPICAL

## 2019-07-26 MED ORDER — MAGNESIUM SULFATE 2 GM/50ML IV SOLN
2.0000 g | INTRAVENOUS | Status: AC
  Administered 2019-07-26 (×2): 2 g via INTRAVENOUS
  Filled 2019-07-26 (×2): qty 50

## 2019-07-26 MED ORDER — LORAZEPAM 2 MG/ML IJ SOLN
0.0000 mg | Freq: Four times a day (QID) | INTRAMUSCULAR | Status: DC
  Filled 2019-07-26: qty 1

## 2019-07-26 MED ORDER — POTASSIUM CHLORIDE 10 MEQ/100ML IV SOLN
10.0000 meq | INTRAVENOUS | Status: AC
  Administered 2019-07-26 (×6): 10 meq via INTRAVENOUS
  Filled 2019-07-26 (×6): qty 100

## 2019-07-26 MED ORDER — LACTATED RINGERS IV SOLN: INTRAVENOUS | Status: DC

## 2019-07-26 MED ORDER — LORAZEPAM 2 MG/ML IJ SOLN
0.0000 mg | INTRAMUSCULAR | Status: AC
  Administered 2019-07-26 – 2019-07-27 (×4): 2 mg via INTRAVENOUS
  Administered 2019-07-27: 4 mg via INTRAVENOUS
  Administered 2019-07-28: 3 mg via INTRAVENOUS
  Administered 2019-07-28: 2 mg via INTRAVENOUS
  Filled 2019-07-26: qty 1
  Filled 2019-07-26: qty 2
  Filled 2019-07-26 (×2): qty 1
  Filled 2019-07-26: qty 2
  Filled 2019-07-26: qty 1
  Filled 2019-07-26: qty 2
  Filled 2019-07-26: qty 1
  Filled 2019-07-26: qty 2
  Filled 2019-07-26: qty 1

## 2019-07-26 MED ORDER — LORAZEPAM 2 MG/ML IJ SOLN: 0.0000 mg | Freq: Two times a day (BID) | INTRAMUSCULAR | Status: DC

## 2019-07-26 MED ORDER — LORAZEPAM 2 MG/ML IJ SOLN: 0.0000 mg | Freq: Three times a day (TID) | INTRAMUSCULAR | Status: AC

## 2019-07-26 NOTE — Progress Notes (Signed)
[facility]  PROGRESS NOTE  Gary Atkinson QMV:[id number] DOB: October 28, 1970 DOA: 07/23/2019 PCP: Patient, No Pcp Per Admit date - 07/23/2019   Admitting Physician Carlton AdamBradley S Chotiner, MD  Outpatient Primary MD for the patient is Patient, No Pcp Per  LOS - 2 Brief Narrative   HPI: Gary Atkinson is a 49 y.o. male with medical history significant for GERD, HTN, chronic elevated LFTs who presents on 07/23/2019 with two days of nausea, vomiting, and abdominal pain. 2 days prior he noticed hotflashes   Noticed hotflashes 2 days ago. Ate chilli at a restaurant with a friend and again felt flushed and sweaty with profuse nausea followed by several episodes ( 6) in car during drive from [place] back to his home in [place]. Then an additional 6 times of emesis with any attempt of oral intake at home. He also endorsed abdominal pain later in the evening located in central of abdomen he used electrotherapy (TENS unit) he applied it to his back and it improved the abdominal pain. He had increased weakness and inability to even go up his stairs at home so he called the ambulance. They gave him IVF and he went to [facility].  Alcohol intake. Social drinker ( playing golf). Not daily. 6 beers a week sometimes ( light beer)  Lost 60 lbs in 6 months; he states it was intentionally in 2020 with changes in diet and increased activity at work. He has been seen at [facility] (Dr. Adela LankArmbruster) for self referral for further evaluation of reflux and weight loss (213 lbs in March and 169 during  pounds on 12 on 12/13/2018.  At that time states weight loss related to decreased appetite and early satiety; he admitted to inability to obtain protonix which previously helped because of loss of insurance in March.  He was noted to have chronically elevated LFTs. Hepatitis panel was negative and autoimmune workup was negative  He had EGD on 12/2018 which showed small hiatal hernia and mild erythema of stomach. Pathology was  negative  Denies any fevers or chills. No chest pain. No changes in medications.  Diarrhea--yesterday. ,loose stool x 1.   Acid reflux. On PPI(dexilant and nexium) on carafate. Says not well controlled. Has acidic taste in mouth. Abdominal discomfort. Hasn't filled carafate in a while  ED Course: Afebrile, BP 166/105, troponin trend 16--52, COVID neg, Lipase 1,502. AST 125, ALT 127, plt 146.  CT abd consistnet with acute pancreatitis, and concerning area of developing pancreatitic necrosis, hepatic steatosis.  Given IV protonix, IVF, IV morphine for pain control at [facility] before transfer to [facility].    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  Patient febrile, leukopenic, tachypneic, tachycardic in setting of active alcohol DTs.  Prior to being in DTs was tolerating sips of clears without nausea, vomiting. -Continue LR infusion at 150 cc per hour per GI -We will likely repeat imaging once more oriented -Continue IV Dilaudid as needed severe pain once more aware, IV antiemetics, spirometry -Monitor CMP/CBC  Alcohol abuse with active alcohol withdrawal and DTs.  Tremors on exam, tachycardic, disoriented, threats of aggressiveness per nursing.  Still protecting airway and following commandsOnly received as needed Ativan per CIWA protocol in the last 24 hours. -Add on scheduled IV Ativan per CIWA protocol -Transfer to stepdown unit, will increase frequency of Ativan per stepdown protocol, low threshold to start precedex and get critical care involved -Monitor on  CIWA protocol, Ativan as needed in place -Thiamine, multivitamin, folic acid  Hypokalemia/hypomagnesemia.  In the setting of all abuse. -Replete IV magnesium, daily -Replete IV potassium, will stay from oral given intermittent disorientation related to active alcohol  Hypocalcemia.  Corrected given hypoalbuminemia to 8.4, in the  setting of hypomagnesemia -Continue close monitoring CMP  Fever.  Suspect related Adrenergic system in the setting of active alcohol withdrawal.  No signs of infected necrosis on CT abdomen regarding pancreatitis. -Blood cultures -Monitor fever trend, Tylenol as needed  Pancytopenia.  White count of 3.9, hemoglobin of 9.9 previously 12, platelets now 83.  Suspect some delusional effect in the setting of IV fluids.  Likely also has bone marrow suppression due to alcohol abuse -Check B12, folate, iron panel -Monitor CBC, no current signs or symptoms of bleeding\  Hyponatremia, suspect hypovolemic Fluid losses related to acute pancreatitis, likely also complicated by alcohol abuse/withdrawal -continue IV fluids -Monitor BMP   Transaminitis, downtrending.  No biliary involvement on abdominal ultrasound/MRCP.  Did show hepatic steatosis, possible and/or resolving alcoholic hepatitis -Trend CMP  HTN.  SBP slightly elevated in the 150s -Hold home lisinopril on admission due to diminished oral intake, continue to monitor -IV hydralazine as needed  Mood disorder, stable -continue home BuSpar, Luvox  GERD, stable -IV PPI  Elevated troponin secondary to demand ischemia, resolved Peaked at 90, has not downtrending, EKG nonischemic without chest pain.    CAD s/p DES Without chest pain, nonischemic EKG -Continue home aspirin once tolerating orals  Thrombocytopenia, acute Could be related to hepatic steatosis.  No signs or symptoms of bleeding -Monitor CBC  Bipolar disorder -Currently holding home meds given altered mentation -Once medically stable, then Psych consultation to assist in psych meds, given some concern for poor tolerance  Family Communication  : Sisters updated at bedside  Code Status : Full  Disposition Plan  :  Patient is from home. Anticipated d/c date:  Greater than 3 days. Barriers to d/c or necessity for inpatient status:  Needs very close monitoring given the  severity of his pancreatitis as evident by imaging, now active alcohol DTs requiring scheduled IV Ativan and monitoring the stepdown unit. Consults  : GI  Procedures  : MRCP, 7/22  DVT Prophylaxis  :  SCDs   Lab Results  Component Value Date   PLT 83 (L) 07/26/2019    Diet :  Diet Order            Diet clear liquid Room service appropriate? Yes; Fluid consistency: Thin  Diet effective now                  Inpatient Medications Scheduled Meds: . busPIRone  20 mg Oral BID  . fluvoxaMINE  150 mg Oral BID  . folic acid  1 mg Oral Daily  . LORazepam  0-4 mg Intravenous Q6H   Followed by  . [START ON 07/28/2019] LORazepam  0-4 mg Intravenous Q12H  . multivitamin with minerals  1 tablet Oral Daily  . pantoprazole (PROTONIX) IV  40 mg Intravenous Q24H  . thiamine  100 mg Oral Daily   Or  . thiamine  100 mg Intravenous Daily   Continuous Infusions: . lactated ringers    . magnesium sulfate bolus IVPB 2 g (07/26/19 0934)   PRN Meds:.acetaminophen **OR** acetaminophen, HYDROmorphone (DILAUDID) injection, LORazepam **OR** LORazepam, ondansetron **OR** ondansetron (ZOFRAN) IV, oxyCODONE  Antibiotics  :   Anti-infectives (From admission, onward)   None  Objective   Vitals:   07/25/19 2117 07/26/19 0509 07/26/19 0837 07/26/19 0944  BP: (!) 128/98 (!) 133/92 (!) 129/82 (!) 141/86  Pulse: 99 99 (!) 123 (!) 114  Resp: Temp: 99 F (37.2 C) 98.9 F (37.2 C) 99.8 F (37.7 C) (!) 101.7 F (38.7 C)  TempSrc: Oral Oral  Oral  SpO2: 90% 92% 94% 97%  Weight:      Height:        SpO2: 97 % O2 Flow Rate (L/min): 2 L/min  Wt Readings from Last 3 Encounters:  07/24/19 74.9 kg  12/17/18 76.7 kg  12/13/18 76.8 kg     Intake/Output Summary (Last 24 hours) at 07/26/2019 1010 Last data filed at 07/25/2019 1800 Gross per 24 hour  Intake 1800 ml  Output --  Net 1800 ml    Physical Exam:  Disoriented, confused, still following commands, no focal  deficits Tremulous Tachycardic Normal respiratory effort on room air, lungs clear Abdomen soft, nondistended, decreased bowel sounds, no rebound tenderness or guarding,      I have personally reviewed the following:   Data Reviewed:  CBC Recent Labs  Lab 07/23/19 2156 07/24/19 0610 07/25/19 0517 07/26/19 0615  WBC 5.6 5.1 5.1 3.8*  HGB 13.8 14.8 12.8* 9.9*  HCT 39.1 44.1 38.2* 28.6*  PLT 146* 151 PLATELET CLUMPS NOTED ON SMEAR, UNABLE TO ESTIMATE 83*  MCV 93.3 97.1 97.7 95.7  MCH 32.9 32.6 32.7 33.1  MCHC 35.3 33.6 33.5 34.6  RDW 11.4* 11.9 11.9 11.7  LYMPHSABS  --  0.3*  --   --   MONOABS  --  0.6  --   --   EOSABS  --  0.0  --   --   BASOSABS  --  0.0  --   --     Chemistries  Recent Labs  Lab 07/23/19 2156 07/24/19 0610 07/25/19 0517 07/26/19 0615  NA 141 142 130* 129*  K 4.2 4.6 4.5 3.2*  CL 101 103 97* 93*  CO2 GLUCOSE 128* 126* 118* 89  BUN CREATININE 0.56* 0.77 0.70 0.77  CALCIUM 8.6* 8.5* 7.6* 7.4*  MG  --  1.5* 1.2*  --   AST 125* 118* 72* 61*  ALT 127* 117* 65* 48*  ALKPHOS 84 82 50 43  BILITOT 0.9 1.1 1.0 1.1   ------------------------------------------------------------------------------------------------------------------ Recent Labs    07/24/19 0610  CHOL 239*  HDL 92  LDLCALC 132*  TRIG 74  CHOLHDL 2.6    No results found for: HGBA1C ------------------------------------------------------------------------------------------------------------------ No results for input(s): TSH, T4TOTAL, T3FREE, THYROIDAB in the last 72 hours.  Invalid input(s): FREET3 ------------------------------------------------------------------------------------------------------------------ No results for input(s): VITAMINB12, FOLATE, FERRITIN, TIBC, IRON, RETICCTPCT in the last 72 hours.  Coagulation profile No results for input(s): INR, PROTIME in the last 168 hours.  No results for input(s): DDIMER in the last 72  hours.  Cardiac Enzymes No results for input(s): CKMB, TROPONINI, MYOGLOBIN in the last 168 hours.  Invalid input(s): CK ------------------------------------------------------------------------------------------------------------------ No results found for: BNP  Micro Results Recent Results (from the past 240 hour(s))  SARS Coronavirus 2 by RT PCR (hospital order, performed in [facility] hospital lab) Nasopharyngeal Nasopharyngeal Swab     Status: None   Collection Time: 07/24/19 12:08 AM   Specimen: Nasopharyngeal Swab  Result Value Ref Range Status   SARS Coronavirus 2 NEGATIVE NEGATIVE Final    Comment: (NOTE) SARS-CoV-2 target nucleic acids are  NOT DETECTED.  The SARS-CoV-2 RNA is generally detectable in upper and lower respiratory specimens during the acute phase of infection. The lowest concentration of SARS-CoV-2 viral copies this assay can detect is 250 copies / mL. A negative result does not preclude SARS-CoV-2 infection and should not be used as the sole basis for treatment or other patient management decisions.  A negative result may occur with improper specimen collection / handling, submission of specimen other than nasopharyngeal swab, presence of viral mutation(s) within the areas targeted by this assay, and inadequate number of viral copies (<250 copies / mL). A negative result must be combined with clinical observations, patient history, and epidemiological information.  Fact Sheet for Patients:   BoilerBrush.com.cy  Fact Sheet for Healthcare Providers: https://pope.com/  This test is not yet approved or  cleared by the [place] FDA and has been authorized for detection and/or diagnosis of SARS-CoV-2 by FDA under an Emergency Use Authorization (EUA).  This EUA will remain in effect (meaning this test can be used) for the duration of the COVID-19 declaration under Section 564(b)(1) of the Act, 21  U.S.C. section 360bbb-3(b)(1), unless the authorization is terminated or revoked sooner.  Performed at [facility], [address], [place], [place] [address]     Radiology Reports CT ABDOMEN PELVIS W CONTRAST  Result Date: 07/23/2019 CLINICAL DATA:  Nausea and vomiting. EXAM: CT ABDOMEN AND PELVIS WITH CONTRAST TECHNIQUE: Multidetector CT imaging of the abdomen and pelvis was performed using the standard protocol following bolus administration of intravenous contrast. CONTRAST:  OMNIPAQUE IOHEXOL 300 MG/ML  SOLN COMPARISON:  None. FINDINGS: Lower chest: The lung bases are clear. The heart size is normal. Hepatobiliary: There is decreased hepatic attenuation suggestive of hepatic steatosis. Normal gallbladder.There is no biliary ductal dilation. Pancreas: There is diffuse peripancreatic fat stranding and free fluid. Approximately 4.6 cm of the pancreatic body and tail is hypoenhancing. Additionally, there is a heterogeneous 2.6 cm area of the pancreatic head and uncinate process that is hypoenhancing. There is no well-formed drainable fluid collection. Spleen: Unremarkable. Adrenals/Urinary Tract: --Adrenal glands: Unremarkable. --Right kidney/ureter: No hydronephrosis or radiopaque kidney stones. --Left kidney/ureter: No hydronephrosis or radiopaque kidney stones. --Urinary bladder: Unremarkable. Stomach/Bowel: --Stomach/Duodenum: No hiatal hernia or other gastric abnormality. Normal duodenal course and caliber. --Small bowel: Unremarkable. --Colon: Unremarkable. --Appendix: Not visualized. No right lower quadrant inflammation or free fluid. Vascular/Lymphatic: Normal course and caliber of the major abdominal vessels. --No retroperitoneal lymphadenopathy. --No mesenteric lymphadenopathy. --No pelvic or inguinal lymphadenopathy. Reproductive: Unremarkable Other: There is free fluid in the abdomen and pelvis the abdominal wall is normal. Musculoskeletal. No acute displaced fractures.  IMPRESSION: 1. Findings consistent with acute pancreatitis. There are hypoenhancing areas of the pancreatic tail and head concerning for developing areas of pancreatic necrosis. 2. Hepatic steatosis. 3. Small volume free fluid in the abdomen and pelvis. Electronically Signed   By: Katherine Mantle M.D.   On: 07/23/2019 23:32   MR 3D Recon At Scanner  Result Date: 07/25/2019 CLINICAL DATA:  Inpatient. Severe acute pancreatitis with elevated liver function tests. EXAM: MRI ABDOMEN WITHOUT AND WITH CONTRAST (INCLUDING MRCP) TECHNIQUE: Multiplanar multisequence MR imaging of the abdomen was performed both before and after the administration of intravenous contrast. Heavily T2-weighted images of the biliary and pancreatic ducts were obtained, and three-dimensional MRCP images were rendered by post processing. CONTRAST:  69mL GADAVIST GADOBUTROL 1 MMOL/ML IV SOLN COMPARISON:  07/23/2019 CT abdomen/pelvis. FINDINGS: Lower chest: Small dependent bilateral pleural effusions, new. Patulous  lower thoracic esophagus with air-fluid level suggesting esophageal dysmotility and/or gastroesophageal reflux. Hepatobiliary: Top-normal liver size. No definite liver surface irregularity. Prominent diffuse hepatic steatosis. No liver mass. Normal gallbladder with no cholelithiasis. No biliary ductal dilatation. Common bile duct diameter 2 mm. No choledocholithiasis. No biliary masses, strictures or beading. Pancreas: Diffusely thickened and edematous pancreatic parenchyma with marked diffuse peripancreatic fat stranding and ill-defined fluid extending into anterior and posterior paranephric retroperitoneal spaces bilaterally, compatible with acute pancreatitis. There are large regions of the pancreatic head and tail demonstrating patchy T1 hyperintensity and absence of enhancement, compatible with hemorrhagic necrotizing pancreatitis, involving approximately 50% of the pancreatic parenchyma. No pancreatic duct dilation. No discrete  pancreatic mass. No measurable peripancreatic fluid collections. Spleen: Normal size. No mass. Adrenals/Urinary Tract: Normal adrenals. No hydronephrosis. Normal kidneys with no renal mass. Stomach/Bowel: Small hiatal hernia. Otherwise normal nondistended stomach. Visualized small and large bowel is normal caliber, with no bowel wall thickening. Vascular/Lymphatic: Normal caliber abdominal aorta. Patent portal, splenic, hepatic and renal veins. No pathologically enlarged lymph nodes in the abdomen. Other: Small volume abdominal ascites.  No focal fluid collection. Musculoskeletal: No aggressive appearing focal osseous lesions. IMPRESSION: 1. Acute hemorrhagic necrotizing pancreatitis involving approximately 50% of the pancreatic parenchyma. No discrete measurable peripancreatic fluid collections. 2. No biliary ductal dilatation. No cholelithiasis or choledocholithiasis. 3. Prominent diffuse hepatic steatosis. 4. Small dependent bilateral pleural effusions, new. Small volume abdominal ascites. 5. Patulous lower thoracic esophagus with air-fluid level suggesting esophageal dysmotility and/or gastroesophageal reflux. Electronically Signed   By: Delbert Phenix M.D.   On: 07/25/2019 07:54   DG Chest Portable 1 View  Result Date: 07/23/2019 CLINICAL DATA:  Vomiting, epigastric pain EXAM: PORTABLE CHEST 1 VIEW COMPARISON:  None. FINDINGS: Lungs are clear. No pneumothorax or pleural effusion. Coronary artery stenting has been performed. Cardiac size within normal limits. The pulmonary vascularity is normal. No acute bone abnormality. IMPRESSION: No active disease. Electronically Signed   By: Helyn Numbers MD   On: 07/23/2019 23:06   MR ABDOMEN MRCP W WO CONTAST  Result Date: 07/25/2019 CLINICAL DATA:  Inpatient. Severe acute pancreatitis with elevated liver function tests. EXAM: MRI ABDOMEN WITHOUT AND WITH CONTRAST (INCLUDING MRCP) TECHNIQUE: Multiplanar multisequence MR imaging of the abdomen was performed both  before and after the administration of intravenous contrast. Heavily T2-weighted images of the biliary and pancreatic ducts were obtained, and three-dimensional MRCP images were rendered by post processing. CONTRAST:  71mL GADAVIST GADOBUTROL 1 MMOL/ML IV SOLN COMPARISON:  07/23/2019 CT abdomen/pelvis. FINDINGS: Lower chest: Small dependent bilateral pleural effusions, new. Patulous lower thoracic esophagus with air-fluid level suggesting esophageal dysmotility and/or gastroesophageal reflux. Hepatobiliary: Top-normal liver size. No definite liver surface irregularity. Prominent diffuse hepatic steatosis. No liver mass. Normal gallbladder with no cholelithiasis. No biliary ductal dilatation. Common bile duct diameter 2 mm. No choledocholithiasis. No biliary masses, strictures or beading. Pancreas: Diffusely thickened and edematous pancreatic parenchyma with marked diffuse peripancreatic fat stranding and ill-defined fluid extending into anterior and posterior paranephric retroperitoneal spaces bilaterally, compatible with acute pancreatitis. There are large regions of the pancreatic head and tail demonstrating patchy T1 hyperintensity and absence of enhancement, compatible with hemorrhagic necrotizing pancreatitis, involving approximately 50% of the pancreatic parenchyma. No pancreatic duct dilation. No discrete pancreatic mass. No measurable peripancreatic fluid collections. Spleen: Normal size. No mass. Adrenals/Urinary Tract: Normal adrenals. No hydronephrosis. Normal kidneys with no renal mass. Stomach/Bowel: Small hiatal hernia. Otherwise normal nondistended stomach. Visualized small and large bowel is normal caliber, with no bowel wall  thickening. Vascular/Lymphatic: Normal caliber abdominal aorta. Patent portal, splenic, hepatic and renal veins. No pathologically enlarged lymph nodes in the abdomen. Other: Small volume abdominal ascites.  No focal fluid collection. Musculoskeletal: No aggressive appearing  focal osseous lesions. IMPRESSION: 1. Acute hemorrhagic necrotizing pancreatitis involving approximately 50% of the pancreatic parenchyma. No discrete measurable peripancreatic fluid collections. 2. No biliary ductal dilatation. No cholelithiasis or choledocholithiasis. 3. Prominent diffuse hepatic steatosis. 4. Small dependent bilateral pleural effusions, new. Small volume abdominal ascites. 5. Patulous lower thoracic esophagus with air-fluid level suggesting esophageal dysmotility and/or gastroesophageal reflux. Electronically Signed   By: Delbert Phenix M.D.   On: 07/25/2019 07:54   US Abdomen Limited RUQ  Result Date: 07/25/2019 CLINICAL DATA:  Pancreatitis. EXAM: ULTRASOUND ABDOMEN LIMITED RIGHT UPPER QUADRANT COMPARISON:  Abdominal MRI 07/24/2019 FINDINGS: Gallbladder: No gallstones or wall thickening visualized. No sonographic Murphy sign noted by sonographer. Common bile duct: Diameter: 6 mm Liver: Diffusely increased parenchymal echogenicity without a focal lesion identified. Portal vein is patent on color Doppler imaging with normal direction of blood flow towards the liver. Other: Small volume perihepatic ascites and right pleural effusion. IMPRESSION: 1. Hepatic steatosis. 2. No gallstones or biliary dilatation. 3. Small volume perihepatic ascites and right pleural effusion. Electronically Signed   By: Sebastian Ache M.D.   On: 07/25/2019 09:22     Time Spent in minutes  30     Laverna Peace M.D on 07/26/2019 at 10:10 AM  To page go to [email or web address] - password [facility]

## 2019-07-26 NOTE — Progress Notes (Signed)
   07/26/19 0837  Assess: MEWS Score  Temp 99.8 F (37.7 C)  BP (!) 129/82  Pulse Rate (!) 123  Resp 20  Level of Consciousness Alert  SpO2 94 %  O2 Device Nasal Cannula  Patient Activity (if Appropriate) In bed  O2 Flow Rate (L/min) 2 L/min  Assess: MEWS Score  MEWS Temp 0  MEWS Systolic 0  MEWS Pulse 2  MEWS RR 0  MEWS LOC 0  MEWS Score 2  MEWS Score Color Yellow  Assess: if the MEWS score is Yellow or Red  Were vital signs taken at a resting state? Yes  Focused Assessment Change from prior assessment (see assessment flowsheet)  Early Detection of Sepsis Score *See Row Information* High  MEWS guidelines implemented *See Row Information* Yes  Treat  MEWS Interventions Administered scheduled meds/treatments;Administered prn meds/treatments;Escalated (See documentation below)  Pain Scale 0-10  Pain Score 7  Pain Intervention(s) Repositioned;Medication (See eMAR)  Nausea relieved by Antiemetic  Patients response to intervention Decreased  Take Vital Signs  Increase Vital Sign Frequency  Yellow: Q 2hr X 2 then Q 4hr X 2, if remains yellow, continue Q 4hrs  Escalate  MEWS: Escalate Yellow: discuss with charge nurse/RN and consider discussing with provider and RRT  Notify: Charge Nurse/RN  Name of Charge Nurse/RN Notified Kim RN   Date Charge Nurse/RN Notified 07/26/19  Time Charge Nurse/RN Notified 0840  Notify: Provider  Provider Name/Title Nettey  Date Provider Notified 07/26/19  Time Provider Notified 0840  Notification Type Page  Notification Reason Change in status  Response See new orders  Date of Provider Response 07/26/19  Time of Provider Response 0841  Notify: Rapid Response  Name of Rapid Response RN Notified Sarah RN   Date Rapid Response Notified 07/26/19  Time Rapid Response Notified 0850  Document  Patient Outcome Transferred/level of care increased  Progress note created (see row info) Yes  MEWS Guidelines - (patients age 38 and over)    Pt with  generalized tremors, disoriented x3, diaphoretic, tachycardic. MD paged, now at bedside. Estate agent notified. Pt to be transferred to higher level of care.

## 2019-07-26 NOTE — Progress Notes (Signed)
Pt disoriented x3, generalized tremors, diaphoretic. Paged MD per CIWA protocol. Prn medications given per protocol, oxygen desat to 86%, responded quickly to oxgyen therapy. Maintaining 96% on 2L [place].  Pt to be transferred to higher level of care. Rapid Response RN and charge RN notified as well.

## 2019-07-26 NOTE — Progress Notes (Addendum)
   07/26/19 0944  Assess: MEWS Score  Temp (!) 101.7 F (38.7 C)  BP (!) 141/86  Pulse Rate (!) 114  Resp 21  SpO2 97 %  O2 Device Nasal Cannula  O2 Flow Rate (L/min) 2 L/min  Assess: MEWS Score  MEWS Temp 2  MEWS Systolic 0  MEWS Pulse 2  MEWS RR 1  MEWS LOC 0  MEWS Score 5  MEWS Score Color Red  Assess: if the MEWS score is Yellow or Red  Were vital signs taken at a resting state? Yes  Focused Assessment Change from prior assessment (see assessment flowsheet)  Early Detection of Sepsis Score *See Row Information* High  MEWS guidelines implemented *See Row Information* Yes  Treat  MEWS Interventions Administered scheduled meds/treatments;Administered prn meds/treatments;Escalated (See documentation below)  Pain Scale Faces  Faces Pain Scale 6  Pain Intervention(s) Medication (See eMAR);Repositioned  Take Vital Signs  Increase Vital Sign Frequency  Red: Q 1hr X 4 then Q 4hr X 4, if remains red, continue Q 4hrs  Escalate  MEWS: Escalate Red: discuss with charge nurse/RN and provider, consider discussing with RRT  Notify: Charge Nurse/RN  Name of Charge Nurse/RN Notified Kim RN   Date Charge Nurse/RN Notified 07/26/19  Time Charge Nurse/RN Notified 0945  Notify: Provider  Provider Name/Title Nettey   Date Provider Notified 07/26/19  Time Provider Notified 0945  Notification Type Page  Notification Reason Change in status  Response Other (Comment)  Date of Provider Response 07/26/19  Time of Provider Response 0944  Notify: Rapid Response  Name of Rapid Response RN Notified Sarah RN   Date Rapid Response Notified 07/26/19  Time Rapid Response Notified 0945  Document  Patient Outcome Transferred/level of care increased  Progress note created (see row info) Yes  MEWS Guidelines - (patients age 41 and over)     Pt's VS triggered red MEWS protocol. Prn meds given to treat hyperthermia. Meds given per CIWA protocol. MD, charge RN and rapid response RN made aware. Pt to  be transferred to higher level of care as soon as bed becomes available.

## 2019-07-26 NOTE — Progress Notes (Addendum)
Patient ID: Gary Atkinson, male   DOB: [birth year], 49 y.o.   MRN: [id number]>     Progress Note   Subjective   Day # 3  CC; acute pancreatitis   Lipase 155 WBC 3.8/ hgb 9.9 K 3.2/Ca 7.4/creat .77  AST 61/ALT 48  Patient now in overt DTs, has been disoriented, tremulous, diaphoretic.  Unable to get any meaningful history from him currently-to be transferred to stepdown      Objective   Vital signs in last 24 hours: Temp:  [98.9 F (37.2 C)-99 F (37.2 C)] 98.9 F (37.2 C) (07/24 0509) Pulse Rate:  [99-101] 99 (07/24 0509) Resp:  [17-20] 17 (07/24 0509) BP: (128-150)/(92-98) 133/92 (07/24 0509) SpO2:  [90 %-94 %] 92 % (07/24 0509) Last BM Date: 07/23/19 General:   WM , confused, trying to get out of bed.  Attempts to answer questions. Heart: Tachycardic regular rate and rhythm; no murmurs Lungs: Respirations even and unlabored, lungs CTA bilaterally Abdomen:  Soft, less tender across the upper abdomen no guarding nondistended. Normal bowel sounds. Extremities:  Without edema. Neurologic: Disoriented, somewhat restless, unable to answer questions Psych:  Cooperative.   Intake/Output from previous day: 07/23 0701 - 07/24 0700 In: 2020 [P.O.:220; I.V.:1800] Out: -  Intake/Output this shift: No intake/output data recorded.  Lab Results: Recent Labs    07/23/19 2156 07/24/19 0610 07/25/19 0517  WBC 5.6 5.1 5.1  HGB 13.8 14.8 12.8*  HCT 39.1 44.1 38.2*  PLT 146* 151 PLATELET CLUMPS NOTED ON SMEAR, UNABLE TO ESTIMATE   BMET Recent Labs    07/24/19 0610 07/25/19 0517 07/26/19 0615  NA 142 130* 129*  K 4.6 4.5 3.2*  CL 103 97* 93*  CO2 24 24 27   GLUCOSE 126* 118* 89  BUN 17 16 10   CREATININE 0.77 0.70 0.77  CALCIUM 8.5* 7.6* 7.4*   LFT Recent Labs    07/26/19 0615  PROT 5.5*  ALBUMIN 2.7*  AST 61*  ALT 48*  ALKPHOS 43  BILITOT 1.1   PT/INR No results for input(s): LABPROT, INR in the last 72 hours.  Studies/Results: MR 3D Recon At  Scanner  Result Date: 07/25/2019 CLINICAL DATA:  Inpatient. Severe acute pancreatitis with elevated liver function tests. EXAM: MRI ABDOMEN WITHOUT AND WITH CONTRAST (INCLUDING MRCP) TECHNIQUE: Multiplanar multisequence MR imaging of the abdomen was performed both before and after the administration of intravenous contrast. Heavily T2-weighted images of the biliary and pancreatic ducts were obtained, and three-dimensional MRCP images were rendered by post processing. CONTRAST:  37mL GADAVIST GADOBUTROL 1 MMOL/ML IV SOLN COMPARISON:  07/23/2019 CT abdomen/pelvis. FINDINGS: Lower chest: Small dependent bilateral pleural effusions, new. Patulous lower thoracic esophagus with air-fluid level suggesting esophageal dysmotility and/or gastroesophageal reflux. Hepatobiliary: Top-normal liver size. No definite liver surface irregularity. Prominent diffuse hepatic steatosis. No liver mass. Normal gallbladder with no cholelithiasis. No biliary ductal dilatation. Common bile duct diameter 2 mm. No choledocholithiasis. No biliary masses, strictures or beading. Pancreas: Diffusely thickened and edematous pancreatic parenchyma with marked diffuse peripancreatic fat stranding and ill-defined fluid extending into anterior and posterior paranephric retroperitoneal spaces bilaterally, compatible with acute pancreatitis. There are large regions of the pancreatic head and tail demonstrating patchy T1 hyperintensity and absence of enhancement, compatible with hemorrhagic necrotizing pancreatitis, involving approximately 50% of the pancreatic parenchyma. No pancreatic duct dilation. No discrete pancreatic mass. No measurable peripancreatic fluid collections. Spleen: Normal size. No mass. Adrenals/Urinary Tract: Normal adrenals. No hydronephrosis. Normal kidneys with no renal mass. Stomach/Bowel: Small hiatal hernia. Otherwise  normal nondistended stomach. Visualized small and large bowel is normal caliber, with no bowel wall thickening.  Vascular/Lymphatic: Normal caliber abdominal aorta. Patent portal, splenic, hepatic and renal veins. No pathologically enlarged lymph nodes in the abdomen. Other: Small volume abdominal ascites.  No focal fluid collection. Musculoskeletal: No aggressive appearing focal osseous lesions. IMPRESSION: 1. Acute hemorrhagic necrotizing pancreatitis involving approximately 50% of the pancreatic parenchyma. No discrete measurable peripancreatic fluid collections. 2. No biliary ductal dilatation. No cholelithiasis or choledocholithiasis. 3. Prominent diffuse hepatic steatosis. 4. Small dependent bilateral pleural effusions, new. Small volume abdominal ascites. 5. Patulous lower thoracic esophagus with air-fluid level suggesting esophageal dysmotility and/or gastroesophageal reflux. Electronically Signed   By: Delbert Phenix M.D.   On: 07/25/2019 07:54   MR ABDOMEN MRCP W WO CONTAST  Result Date: 07/25/2019 CLINICAL DATA:  Inpatient. Severe acute pancreatitis with elevated liver function tests. EXAM: MRI ABDOMEN WITHOUT AND WITH CONTRAST (INCLUDING MRCP) TECHNIQUE: Multiplanar multisequence MR imaging of the abdomen was performed both before and after the administration of intravenous contrast. Heavily T2-weighted images of the biliary and pancreatic ducts were obtained, and three-dimensional MRCP images were rendered by post processing. CONTRAST:  104mL GADAVIST GADOBUTROL 1 MMOL/ML IV SOLN COMPARISON:  07/23/2019 CT abdomen/pelvis. FINDINGS: Lower chest: Small dependent bilateral pleural effusions, new. Patulous lower thoracic esophagus with air-fluid level suggesting esophageal dysmotility and/or gastroesophageal reflux. Hepatobiliary: Top-normal liver size. No definite liver surface irregularity. Prominent diffuse hepatic steatosis. No liver mass. Normal gallbladder with no cholelithiasis. No biliary ductal dilatation. Common bile duct diameter 2 mm. No choledocholithiasis. No biliary masses, strictures or beading.  Pancreas: Diffusely thickened and edematous pancreatic parenchyma with marked diffuse peripancreatic fat stranding and ill-defined fluid extending into anterior and posterior paranephric retroperitoneal spaces bilaterally, compatible with acute pancreatitis. There are large regions of the pancreatic head and tail demonstrating patchy T1 hyperintensity and absence of enhancement, compatible with hemorrhagic necrotizing pancreatitis, involving approximately 50% of the pancreatic parenchyma. No pancreatic duct dilation. No discrete pancreatic mass. No measurable peripancreatic fluid collections. Spleen: Normal size. No mass. Adrenals/Urinary Tract: Normal adrenals. No hydronephrosis. Normal kidneys with no renal mass. Stomach/Bowel: Small hiatal hernia. Otherwise normal nondistended stomach. Visualized small and large bowel is normal caliber, with no bowel wall thickening. Vascular/Lymphatic: Normal caliber abdominal aorta. Patent portal, splenic, hepatic and renal veins. No pathologically enlarged lymph nodes in the abdomen. Other: Small volume abdominal ascites.  No focal fluid collection. Musculoskeletal: No aggressive appearing focal osseous lesions. IMPRESSION: 1. Acute hemorrhagic necrotizing pancreatitis involving approximately 50% of the pancreatic parenchyma. No discrete measurable peripancreatic fluid collections. 2. No biliary ductal dilatation. No cholelithiasis or choledocholithiasis. 3. Prominent diffuse hepatic steatosis. 4. Small dependent bilateral pleural effusions, new. Small volume abdominal ascites. 5. Patulous lower thoracic esophagus with air-fluid level suggesting esophageal dysmotility and/or gastroesophageal reflux. Electronically Signed   By: Delbert Phenix M.D.   On: 07/25/2019 07:54   US Abdomen Limited RUQ  Result Date: 07/25/2019 CLINICAL DATA:  Pancreatitis. EXAM: ULTRASOUND ABDOMEN LIMITED RIGHT UPPER QUADRANT COMPARISON:  Abdominal MRI 07/24/2019 FINDINGS: Gallbladder: No gallstones  or wall thickening visualized. No sonographic Murphy sign noted by sonographer. Common bile duct: Diameter: 6 mm Liver: Diffusely increased parenchymal echogenicity without a focal lesion identified. Portal vein is patent on color Doppler imaging with normal direction of blood flow towards the liver. Other: Small volume perihepatic ascites and right pleural effusion. IMPRESSION: 1. Hepatic steatosis. 2. No gallstones or biliary dilatation. 3. Small volume perihepatic ascites and right pleural effusion. Electronically Signed  By: Sebastian Ache M.D.   On: 07/25/2019 09:22       Assessment / Plan:    #25 49 year old white male admitted with acute pancreatitis, and found on imaging with MRI to have acute necrotizing pancreatitis  Etiology secondary to EtOH No significant phlegmon or pseudocyst at present No evidence for infected necrosis, remains afebrile, normal WBC and therefore not on antibiotics  Clinically has not had severe pancreatitis, thus far no respiratory compromise or kidney injury.  He has developed associated metabolic derangement with hypomagnesemia, hypocalcemia and hypokalemia.   #2  transaminitis improving #3 EtOH abuse-patient had not previously been forthcoming about his alcohol abuse, now in overt DTs.  Plan; patient to be transferred to stepdown for closer monitoring, and management of DTs Continue aggressive volume replacement, have reordered LR at 150 an hour Okay for clear liquids, only if alert Will defer to medicine to address hypocalcemia and hypokalemia Anticipate repeat imaging early next week.   Active Problems:   Benign essential HTN   Acute hemorrhagic pancreatitis   Intractable nausea and vomiting   Abdominal pain   GERD (gastroesophageal reflux disease)   Hiatal hernia   Elevated LFTs   Elevated troponin   Thrombocytopenia (HCC)   Alcohol abuse with withdrawal    LOS: 2 days   Amy Esterwood PA-C 07/26/2019, 8:27 AM  GI ATTENDING  Interval  history data reviewed.  Patient seen and examined.  Agree with interval progress note as outlined above.  Agree with assessment with plan as outlined.  No additions or deletions.  Wilhemina Bonito. Eda Keys., M.D. [facility] Division of Gastroenterology

## 2019-07-26 NOTE — Progress Notes (Addendum)
°   07/26/19 1034  Assess: MEWS Score  Temp (!) 101.6 F (38.7 C)  BP (!) 140/87  Pulse Rate (!) 112  Resp 21  Level of Consciousness Responds to Voice  SpO2 100 %  O2 Device Nasal Cannula  Patient Activity (if Appropriate) In bed  O2 Flow Rate (L/min) 2 L/min  Assess: MEWS Score  MEWS Temp 2  MEWS Systolic 0  MEWS Pulse 2  MEWS RR 1  MEWS LOC 1  MEWS Score 6  MEWS Score Color Red  Assess: if the MEWS score is Yellow or Red  Were vital signs taken at a resting state? Yes  Focused Assessment Change from prior assessment (see assessment flowsheet)  Early Detection of Sepsis Score *See Row Information* High  MEWS guidelines implemented *See Row Information* Yes  Treat  MEWS Interventions Administered scheduled meds/treatments;Administered prn meds/treatments;Escalated (See documentation below)  Pain Scale Faces  Faces Pain Scale 6  Pain Intervention(s) Medication (See eMAR)  Patients response to intervention Unchanged  Take Vital Signs  Increase Vital Sign Frequency  Red: Q 1hr X 4 then Q 4hr X 4, if remains red, continue Q 4hrs  Escalate  MEWS: Escalate Red: discuss with charge nurse/RN and provider, consider discussing with RRT  Notify: Charge Nurse/RN  Name of Charge Nurse/RN Notified Kim RN  Date Charge Nurse/RN Notified 07/26/19  Time Charge Nurse/RN Notified 1035  Notify: Provider  Provider Name/Title Nettey   Date Provider Notified 07/26/19  Time Provider Notified 1036  Notification Type Page  Notification Reason Change in status  Response See new orders (awaiting bed availability )  Date of Provider Response 07/26/19  Time of Provider Response 1036  Notify: Rapid Response  Name of Rapid Response RN Notified Sarah RN   Date Rapid Response Notified 07/26/19  Time Rapid Response Notified 1035  Document  Patient Outcome Transferred/level of care increased  Progress note created (see row info) Yes  MEWS Guidelines - (patients age 5 and over)    Pt's VS  continues to trigger red MEWS protocol. MD, charge RN and rapid response RN all made aware.  Appropriate medications given. Awaiting bed availability for transfer to higher level of care. Pt's sister's at bedside.

## 2019-07-26 NOTE — Progress Notes (Addendum)
   07/26/19 1225  Assess: MEWS Score  Temp (!) 100.7 F (38.2 C)  BP (!) 135/81  Pulse Rate (!) 108  Resp 17  Level of Consciousness Responds to Voice  SpO2 99 %  O2 Device Nasal Cannula  Patient Activity (if Appropriate) In bed  O2 Flow Rate (L/min) 2 L/min  Assess: MEWS Score  MEWS Temp 1  MEWS Systolic 0  MEWS Pulse 1  MEWS RR 0  MEWS LOC 1  MEWS Score 3  MEWS Score Color Yellow  Assess: if the MEWS score is Yellow or Red  Were vital signs taken at a resting state? Yes  Focused Assessment Change from prior assessment (see assessment flowsheet)  Early Detection of Sepsis Score *See Row Information* Medium  MEWS guidelines implemented *See Row Information* Yes  Treat  MEWS Interventions Administered scheduled meds/treatments;Administered prn meds/treatments  Pain Scale Faces  Faces Pain Scale 0  Take Vital Signs  Increase Vital Sign Frequency  Yellow: Q 2hr X 2 then Q 4hr X 2, if remains yellow, continue Q 4hrs (following completion of red protocol )  Escalate  MEWS: Escalate Yellow: discuss with charge nurse/RN and consider discussing with provider and RRT  Notify: Charge Nurse/RN  Name of Charge Nurse/RN Notified Kim RN   Date Charge Nurse/RN Notified 07/26/19  Time Charge Nurse/RN Notified 1225  Document  Patient Outcome Transferred/level of care increased  Progress note created (see row info) Yes  MEWS Guidelines - (patients age 43 and over)    Pt triggered yellow MEWS protocol, to be implemented following completion of red protocol. Pt to be transferred to stepdown as soon as bed become available.

## 2019-07-27 DIAGNOSIS — I10 Essential (primary) hypertension: Secondary | ICD-10-CM

## 2019-07-27 DIAGNOSIS — F10231 Alcohol dependence with withdrawal delirium: Secondary | ICD-10-CM

## 2019-07-27 DIAGNOSIS — R1013 Epigastric pain: Secondary | ICD-10-CM

## 2019-07-27 LAB — COMPREHENSIVE METABOLIC PANEL
ALT: 44 U/L (ref 0–44)
AST: 49 U/L — ABNORMAL HIGH (ref 15–41)
Albumin: 2.6 g/dL — ABNORMAL LOW (ref 3.5–5.0)
Alkaline Phosphatase: 52 U/L (ref 38–126)
Anion gap: 9 (ref 5–15)
BUN: 7 mg/dL (ref 6–20)
CO2: 27 mmol/L (ref 22–32)
Calcium: 7.5 mg/dL — ABNORMAL LOW (ref 8.9–10.3)
Chloride: 97 mmol/L — ABNORMAL LOW (ref 98–111)
Creatinine, Ser: 0.62 mg/dL (ref 0.61–1.24)
GFR calc Af Amer: >60 mL/min (ref >60)
GFR calc non Af Amer: >60 mL/min (ref >60)
Glucose, Bld: 101 mg/dL — ABNORMAL HIGH (ref 70–99)
Potassium: 3.8 mmol/L (ref 3.5–5.1)
Sodium: 133 mmol/L — ABNORMAL LOW (ref 135–145)
Total Bilirubin: 1 mg/dL (ref 0.3–1.2)
Total Protein: 5.5 g/dL — ABNORMAL LOW (ref 6.5–8.1)

## 2019-07-27 LAB — LIPASE, BLOOD: Lipase: 56 U/L — ABNORMAL HIGH (ref 11–51)

## 2019-07-27 LAB — MRSA PCR SCREENING: MRSA by PCR: NEGATIVE

## 2019-07-27 LAB — MAGNESIUM: Magnesium: 2.1 mg/dL (ref 1.7–2.4)

## 2019-07-27 LAB — PHOSPHORUS: Phosphorus: 1.9 mg/dL — ABNORMAL LOW (ref 2.5–4.6)

## 2019-07-27 MED ORDER — POTASSIUM PHOSPHATES 15 MMOLE/5ML IV SOLN
10.0000 mmol | Freq: Once | INTRAVENOUS | Status: AC
  Administered 2019-07-27: 10 mmol via INTRAVENOUS
  Filled 2019-07-27: qty 3.33

## 2019-07-27 MED ORDER — DIAZEPAM 5 MG PO TABS
5.0000 mg | ORAL_TABLET | Freq: Two times a day (BID) | ORAL | Status: DC
  Administered 2019-07-27 – 2019-08-06 (×21): 5 mg via ORAL
  Filled 2019-07-27 (×21): qty 1

## 2019-07-27 MED ORDER — HYDRALAZINE HCL 20 MG/ML IJ SOLN
5.0000 mg | Freq: Four times a day (QID) | INTRAMUSCULAR | Status: DC | PRN
  Administered 2019-07-31: 10 mg via INTRAVENOUS
  Filled 2019-07-27 (×2): qty 1

## 2019-07-27 MED ORDER — PROPRANOLOL HCL 20 MG PO TABS
20.0000 mg | ORAL_TABLET | Freq: Every day | ORAL | Status: DC
  Administered 2019-07-27 – 2019-08-06 (×11): 20 mg via ORAL
  Filled 2019-07-27 (×13): qty 1

## 2019-07-27 MED ORDER — ORAL CARE MOUTH RINSE
15.0000 mL | Freq: Two times a day (BID) | OROMUCOSAL | Status: DC
  Administered 2019-07-27 – 2019-07-30 (×8): 15 mL via OROMUCOSAL

## 2019-07-27 MED ORDER — DEXMEDETOMIDINE HCL IN NACL 200 MCG/50ML IV SOLN
0.4000 ug/kg/h | INTRAVENOUS | Status: DC
  Administered 2019-07-27: 0.4 ug/kg/h via INTRAVENOUS
  Administered 2019-07-27: 1.2 ug/kg/h via INTRAVENOUS
  Administered 2019-07-27: 0.9 ug/kg/h via INTRAVENOUS
  Administered 2019-07-27 (×2): 1.1 ug/kg/h via INTRAVENOUS
  Administered 2019-07-28: 0.7 ug/kg/h via INTRAVENOUS
  Administered 2019-07-28: 0.8 ug/kg/h via INTRAVENOUS
  Administered 2019-07-28: 1 ug/kg/h via INTRAVENOUS
  Administered 2019-07-28 (×2): 1.2 ug/kg/h via INTRAVENOUS
  Administered 2019-07-28: 0.4 ug/kg/h via INTRAVENOUS
  Administered 2019-07-29 (×3): 0.8 ug/kg/h via INTRAVENOUS
  Administered 2019-07-29: 0.6 ug/kg/h via INTRAVENOUS
  Filled 2019-07-27 (×8): qty 50
  Filled 2019-07-27 (×4): qty 100

## 2019-07-27 MED ORDER — LACTATED RINGERS IV SOLN: INTRAVENOUS | Status: AC

## 2019-07-27 MED ORDER — BOOST / RESOURCE BREEZE PO LIQD CUSTOM
1.0000 | Freq: Three times a day (TID) | ORAL | Status: DC
  Administered 2019-07-27 – 2019-08-06 (×11): 1 via ORAL

## 2019-07-27 MED ORDER — LISINOPRIL 2.5 MG PO TABS
5.0000 mg | ORAL_TABLET | Freq: Every day | ORAL | Status: DC
  Administered 2019-07-27 – 2019-07-29 (×3): 5 mg via ORAL
  Filled 2019-07-27 (×3): qty 2

## 2019-07-27 MED ORDER — HYDRALAZINE HCL 20 MG/ML IJ SOLN
5.0000 mg | Freq: Once | INTRAMUSCULAR | Status: AC
  Administered 2019-07-27: 5 mg via INTRAVENOUS
  Filled 2019-07-27: qty 1

## 2019-07-27 NOTE — Progress Notes (Addendum)
Patient ID: Gary Atkinson, male   DOB: January 16, 1970, 49 y.o.   MRN: [id number]    Progress Note   Subjective   day # 4 CC ; acute pancreatitis  Lipase 56 K= 3.8/ Ca 7.5  LFT's AST 49/ALT 44  Transferred to stepdown yesterday due to active DTs.  He is still tremulous, but improved over yesterday and mentating better.  Still cannot give a great history but abdominal pain seems to be improving, no vomiting    Objective   Vital signs in last 24 hours: Temp:  [98.6 F (37 C)-101.7 F (38.7 C)] 99.5 F (37.5 C) (07/25 0449) Pulse Rate:  [96-114] 102 (07/25 0800) Resp:  [16-30] 24 (07/25 0800) BP: (127-188)/(51-122) 171/95 (07/25 0800) SpO2:  [96 %-100 %] 97 % (07/25 0700) Weight:  [84.7 kg] 84.7 kg (07/24 1324) Last BM Date: 07/27/19 General:    white male in NAD Heart: Tachycardic regular rate and rhythm; no murmurs Lungs: Respirations even and unlabored, lungs CTA bilaterally Abdomen:  Soft, less tender across the upper abdomen no guarding or rebound and nondistended. Normal bowel sounds. Extremities:  Without edema. Neurologic:  Alert and orientedx2 remains tremulous mentation improved over yesterday  Psych:  Cooperative.   Intake/Output from previous day: 07/24 0701 - 07/25 0700 In: 4157.1 [P.O.:900; I.V.:2657.1; IV Piggyback:600] Out: 3475 [Urine:3475] Intake/Output this shift: Total I/O In: -  Out: 1250 [Urine:1250]  Lab Results: Recent Labs    07/25/19 0517 07/26/19 0615  WBC 5.1 3.8*  HGB 12.8* 9.9*  HCT 38.2* 28.6*  PLT PLATELET CLUMPS NOTED ON SMEAR, UNABLE TO ESTIMATE 83*   BMET Recent Labs    07/25/19 0517 07/26/19 0615 07/27/19 0215  NA 130* 129* 133*  K 4.5 3.2* 3.8  CL 97* 93* 97*  CO2 24 27 27   GLUCOSE 118* 89 101*  BUN 16 10 7   CREATININE 0.70 0.77 0.62  CALCIUM 7.6* 7.4* 7.5*   LFT Recent Labs    07/27/19 0215  PROT 5.5*  ALBUMIN 2.6*  AST 49*  ALT 44  ALKPHOS 52  BILITOT 1.0   PT/INR No results for input(s): LABPROT, INR in  the last 72 hours.  Studies/Results: Abdomen Limited RUQ  Result Date: 07/25/2019 CLINICAL DATA:  Pancreatitis. EXAM: ULTRASOUND ABDOMEN LIMITED RIGHT UPPER QUADRANT COMPARISON:  Abdominal MRI 07/24/2019 FINDINGS: Gallbladder: No gallstones or wall thickening visualized. No sonographic Murphy sign noted by sonographer. Common bile duct: Diameter: 6 mm Liver: Diffusely increased parenchymal echogenicity without a focal lesion identified. Portal vein is patent on color Doppler imaging with normal direction of blood flow towards the liver. Other: Small volume perihepatic ascites and right pleural effusion. IMPRESSION: 1. Hepatic steatosis. 2. No gallstones or biliary dilatation. 3. Small volume perihepatic ascites and right pleural effusion. Electronically Signed   By: 07/27/2019 M.D.   On: 07/25/2019 09:22       Assessment / Plan:    #57  49 year old white male admitted with acute pancreatitis and on MRI found to have acute necrotizing pancreatitis.  This is secondary to EtOH abuse. No significant phlegmon or pseudocyst present on initial imaging. No evidence for infected necrosis  Metabolic parameters improving  #2 acute transaminitis likely secondary to EtOH resolving #3 EtOH abuse suspect chronic.  He had previously not been forthcoming about alcohol abuse, developed overt DTs yesterday, on CIWA protocol  #4 anemia-likely secondary to hemorrhagic component of pancreatitis   Plan; continue aggressive volume replacement Clear liquids, advance to full liquids as tolerated Continue CIWA protocol  Will need repeat imaging in a few days.   LOS: 3 days   Amy EsterwoodPA-C  07/27/2019, 8:46 AM  GI ATTENDING  Interval history and data reviewed.  Patient seen and examined.  Agree with interval progress note as outlined.  No additions or deletions.  Wilhemina Bonito. Eda Keys., M.D. [facility] [place] Division of Gastroenterology

## 2019-07-27 NOTE — Consult Note (Signed)
NAME:  Gary Atkinson, MRN:  [id number], DOB:  1970-08-06, LOS: 3 ADMISSION DATE:  07/23/2019, CONSULTATION DATE:  07/27/2019 REFERRING MD:  Dennison Nancy MD, CHIEF COMPLAINT:   Acute pancreatitis, alcohol withdrawal, need for Precedex  Brief History   48 year old with alcohol abuse, acute pancreatitis.  Admitted on 7/21 PCCM consulted for active DTs and need for Precedex. He had received 6 mg ativan so far.   Past Medical History    has a past medical history of Allergic rhinitis, Anxiety, Asthma, CAD (coronary artery disease), Depression, GERD (gastroesophageal reflux disease), Hyperlipemia, Hypertension, MI (myocardial infarction) (HCC), and Pneumonia.  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  Right upper quadrant ultrasound 07/25/2019-hepatic steatosis.  No gallstones or biliary dilatation.  Micro Data:  Blood culture 7/24-negative  Antimicrobials:    Interim history/subjective:    Objective   Blood pressure (!) 157/113, pulse 91, temperature 100 F (37.8 C), temperature source Oral, resp. rate (!) 36, height 5\' 11"  (1.803 m), weight 84.7 kg, SpO2 94 %.        Intake/Output Summary (Last 24 hours) at 07/27/2019 1235 Last data filed at 07/27/2019 1119 Gross per 24 hour  Intake 4057.13 ml  Output 5425 ml  Net -1367.87 ml   Filed Weights   07/23/19 2152 07/24/19 0552 07/26/19 1324  Weight: 72.6 kg 74.9 kg 84.7 kg    Examination: Gen:      No acute distress, tremulous HEENT:  EOMI, sclera anicteric Neck:     No masses; no thyromegaly Lungs:    Clear to auscultation bilaterally; normal respiratory effort CV:         Regular rate and rhythm; no murmurs Abd:      Mild abd tenderness Ext:    No edema; adequate peripheral perfusion Skin:      Warm and dry; no rash Neuro: Awake,  confused  Resolved Hospital Problem list     Assessment & Plan:  EtOH abuse, alcohol withdrawal Continue CIWA protocol Start standing Valium 5 mg twice daily Precedex gtt if needed but will try long acting benzos first  Acute pancreatitis secondary to EtOH Improving with downtrending lipase. Tolerating clear liquid diet Continue LR for fluid hydration   Best practice:  Per primary team  Labs   CBC: Recent Labs  Lab 07/23/19 2156 07/24/19 0610 07/25/19 0517 07/26/19 0615  WBC 5.6 5.1 5.1 3.8*  NEUTROABS  --  4.2  --   --   HGB 13.8 14.8 12.8* 9.9*  HCT 39.1 44.1 38.2* 28.6*  MCV 93.3 97.1 97.7 95.7  PLT 146* 151 PLATELET CLUMPS NOTED ON SMEAR, UNABLE TO ESTIMATE 83*    Basic Metabolic Panel: Recent Labs  Lab 07/23/19 2156 07/24/19 0610 07/25/19 0517 07/26/19 0615 07/27/19 0215  NA 141 142 130* 129* 133*  K 4.2 4.6 4.5 3.2* 3.8  CL 101 103 97* 93* 97*  CO2 24 24 24 27 27   GLUCOSE 128* 126* 118* 89 101*  BUN 12 17 16 10 7   CREATININE 0.56* 0.77 0.70 0.77 0.62  CALCIUM 8.6* 8.5* 7.6* 7.4* 7.5*  MG  --  1.5* 1.2*  --  2.1  PHOS  --   --  3.4  --  1.9*   GFR: Estimated Creatinine Clearance: 116.3 mL/min (by C-G formula based on SCr of 0.62 mg/dL). Recent Labs  Lab 07/23/19  2156 07/24/19 0610 07/25/19 0517 07/26/19 0615  WBC 5.6 5.1 5.1 3.8*    Liver Function Tests: Recent Labs  Lab 07/23/19 2156 07/24/19 0610 07/25/19 0517 07/26/19 0615 07/27/19 0215  AST 125* 118* 72* 61* 49*  ALT 127* 117* 65* 48* 44  ALKPHOS 84 82 50 43 52  BILITOT 0.9 1.1 1.0 1.1 1.0  PROT 6.9 7.0 5.6* 5.5* 5.5*  ALBUMIN 3.6 3.8 2.8* 2.7* 2.6*   Recent Labs  Lab 07/23/19 2156 07/24/19 0610 07/25/19 0517 07/26/19 0615 07/27/19 0215  LIPASE 1,502* 1,188* 623* 155* 56*   No results for input(s): AMMONIA in the last 168 hours.  ABG No results found for: PHART, PCO2ART, PO2ART, HCO3, TCO2, ACIDBASEDEF, O2SAT   Coagulation Profile: No results for input(s): INR,  PROTIME in the last 168 hours.  Cardiac Enzymes: No results for input(s): CKTOTAL, CKMB, CKMBINDEX, TROPONINI in the last 168 hours.  HbA1C: No results found for: HGBA1C  CBG: No results for input(s): GLUCAP in the last 168 hours.  Review of Systems:   Unable to obtain due to encephalopathy  Past Medical History  He,  has a past medical history of Allergic rhinitis, Anxiety, Asthma, CAD (coronary artery disease), Depression, GERD (gastroesophageal reflux disease), Hyperlipemia, Hypertension, MI (myocardial infarction) (HCC), and Pneumonia.   Surgical History    Past Surgical History:  Procedure Laterality Date  . APPENDECTOMY    . CARDIAC CATHETERIZATION     with 2 stents     Social History   reports that he quit smoking about 19 years ago. His smoking use included cigarettes. He has never used smokeless tobacco. He reports current alcohol use. He reports that he does not use drugs.   Family History   His family history includes Alcoholism in his father; Liver disease in his father.   Allergies No Known Allergies   Home Medications  Prior to Admission medications   Medication Sig Start Date End Date Taking? Authorizing Provider  acetaminophen (TYLENOL) 325 MG tablet Take 650 mg by mouth every 6 (six) hours as needed for mild pain or headache.   Yes [provider]  Ascorbic Acid (VITAMIN C PO) Take 1 tablet by mouth daily.   Yes [provider]  aspirin 81 MG tablet Take 81 mg by mouth at bedtime.   Yes [provider]  busPIRone (BUSPAR) 10 MG tablet Take 20 mg by mouth 2 (two) times daily. 07/08/19  Yes [provider]  clonazePAM (KLONOPIN) 1 MG disintegrating tablet Take 1 mg by mouth 2 (two) times daily as needed for anxiety. 07/08/19  Yes [provider]  dexlansoprazole (DEXILANT) 60 MG capsule Take 1 capsule (60 mg total) by mouth daily. 01/09/19  Yes Armbruster, Willaim Rayas, MD  esomeprazole (NEXIUM) 40 MG packet Take 40 mg by  mouth 2 (two) times daily before a meal. Patient taking differently: Take 40 mg by mouth 2 (two) times daily as needed (indigestion).  01/07/19  Yes Armbruster, Willaim Rayas, MD  Fluvoxamine Maleate 150 MG CP24 Take 300 mg by mouth daily. 06/11/19  Yes [provider]  lisinopril (ZESTRIL) 5 MG tablet Take 5 mg by mouth daily.  12/12/18 07/24/19 Yes [provider]  nitroGLYCERIN (NITROSTAT) 0.4 MG SL tablet Place 0.4 mg under the tongue as needed for chest pain.  02/23/13 07/24/19 Yes [provider]  propranolol (INDERAL) 40 MG tablet Take 20 mg by mouth daily. 05/15/18 07/24/19 Yes [provider]  topiramate (TOPAMAX) 25 MG tablet Take 25 mg  by mouth daily.  12/12/18  Yes [provider]  ondansetron (ZOFRAN ODT) 4 MG disintegrating tablet Take 1 tablet (4 mg total) by mouth every 8 (eight) hours as needed for nausea or vomiting. Patient not taking: Reported on 07/24/2019 01/20/19   Benancio Deeds, MD  sucralfate (CARAFATE) 1 GM/10ML suspension Take 10 mLs (1 g total) by mouth every 6 (six) hours as needed. Patient not taking: Reported on 12/17/2018 12/13/18   Benancio Deeds, MD     Critical care time:    The patient is critically ill with multiple organ system failure and requires high complexity decision making for assessment and support, frequent evaluation and titration of therapies, advanced monitoring, review of radiographic studies and interpretation of complex data.   Critical Care Time devoted to patient care services, exclusive of separately billable procedures, described in this note is 35 minutes.   Chilton Greathouse MD [facility] Please see [email or web address] for pager details.  07/27/2019, 1:28 PM

## 2019-07-27 NOTE — Progress Notes (Addendum)
[facility]  PROGRESS NOTE  Gary Atkinson LGX:[id number] DOB: 01/16/1970 DOA: 07/23/2019 PCP: Patient, No Pcp Per Admit date - 07/23/2019   Admitting Physician Carlton Adam, MD  Outpatient Primary MD for the patient is Patient, No Pcp Per  LOS - 3 Brief Narrative   HPI: Gary Atkinson is a 49 y.o. male with medical history significant for GERD, HTN, chronic elevated LFTs who presents on 07/23/2019 with two days of nausea, vomiting, and abdominal pain. 2 days prior he noticed hotflashes   Noticed hotflashes 2 days ago. Ate chilli at a restaurant with a friend and again felt flushed and sweaty with profuse nausea followed by several episodes ( 6) in car during drive from [place] back to his home in [place]. Then an additional 6 times of emesis with any attempt of oral intake at home. He also endorsed abdominal pain later in the evening located in central of abdomen he used electrotherapy (TENS unit) he applied it to his back and it improved the abdominal pain. He had increased weakness and inability to even go up his stairs at home so he called the ambulance. They gave him IVF and he went to [facility]  Alcohol intake. Social drinker ( playing golf). Not daily. 6 beers a week sometimes ( light beer)  Lost 60 lbs in 6 months; he states it was intentionally in 2020 with changes in diet and increased activity at work. He has been seen at [facility] (Dr. Adela Lank) for self referral for further evaluation of reflux and weight loss (213 lbs in March and 169 during  pounds on 12 on 12/13/2018.  At that time states weight loss related to decreased appetite and early satiety; he admitted to inability to obtain protonix which previously helped because of loss of insurance in March.  He was noted to have chronically elevated LFTs. Hepatitis panel was negative and autoimmune workup was negative  He had EGD on 12/2018 which showed small hiatal hernia and mild erythema of stomach. Pathology was  negative  Denies any fevers or chills. No chest pain. No changes in medications.  Diarrhea--yesterday. ,loose stool x 1.   Acid reflux. On PPI(dexilant and nexium) on carafate. Says not well controlled. Has acidic taste in mouth. Abdominal discomfort. Hasn't filled carafate in a while  ED Course: Afebrile, BP 166/105, troponin trend 16--52, COVID neg, Lipase 1,502. AST 125, ALT 127, plt 146.  CT abd consistnet with acute pancreatitis, and concerning area of developing pancreatitic necrosis, hepatic steatosis.  Given IV protonix, IVF, IV morphine for pain control at [facility] before transfer to [facility].   Hospital course: Patient was transferred to stepdown unit on 7/24 due to worsening alcohol withdrawal with active delirium tremens requiring more frequently scheduled IV Ativan.   Subjective  No acute events overnight.  Patient states he is able to tolerate some broth.  States his belly pain seems improved.  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 -Continue LR infusion at 150 cc per hour per GI -likely repeat imaging once more oriented -Continue IV Dilaudid as needed severe pain  more aware, IV antiemetics, spirometry -Monitor CMP/CBC  Alcohol abuse with active alcohol withdrawal and DTs, improving.  Much less tremulousness on exam, oriented x3, still mildly tachycardic and hypertensive.  Seems to be responding to scheduled Ativan t-Add on scheduled IV Ativan per CIWA protocol -Continue monitoring in stepdown unit -CIWA protocol per  SDU, continue scheduled Ativan and as needed for breakthrough, monitor mental status  -Thiamine, multivitamin, folic acid  Scant upper airway wheezing.  No crackles in lower lung fields.  Suspect this is related to positioning/upper airway disease -O2 support, wean O2 to maintain SPO2 greater than 92% -Incentive  spirometry -Monitor respiratory status while on IV fluids  Hypokalemia/hypomagnesemia/hypophosphatemia.  In the setting of alcohol abuse. -Replete IV magnesium as needed, check mag daily -Replete potassium, maintain goal of 4 -Sodium phosphate for correction  Hypocalcemia.  Corrected given hypoalbuminemia to 8.4, in the setting of hypomagnesemia -Continue close monitoring CMP  Fever, resolved.  Suspect related Adrenergic system in the setting of active alcohol withdrawal.  No signs of infected necrosis on CT abdomen regarding pancreatitis. -Blood cultures -Monitor fever trend, Tylenol as needed  Pancytopenia.  White count of 3.9, hemoglobin of 9.9 previously 12, platelets now 83.  Suspect some delusional effect in the setting of IV fluids.  Likely also has bone marrow suppression due to alcohol abuse -Check B12, folate, iron panel -Monitor CBC, no current signs or symptoms of bleeding\  Hyponatremia, suspect hypovolemic, improving Fluid losses related to acute pancreatitis, likely also complicated by alcohol abuse/withdrawal -continue IV fluids -Monitor BMP  Transaminitis, downtrending.  No biliary involvement on abdominal ultrasound/MRCP.  Did show hepatic steatosis, possible and/or resolving alcoholic hepatitis -Trend CMP  HTN.  SBP in the 170s while holding home BP meds due to change in alteration -Resume home lisinopril and Inderal, given improvement in mental status -IV hydralazine as needed  Mood disorder, stable.  Reported history of alcohol disorder per family -continue home BuSpar, Luvox --Psych consult to assist with any change in medications given no motor dysfunction  GERD, stable -IV PPI  Elevated troponin secondary to demand ischemia, resolved Peaked at 90, has now downtrending, EKG nonischemic without chest pain.    CAD s/p DES Without chest pain, nonischemic EKG -Continue home aspirin once tolerating orals  Thrombocytopenia, acute Could be related to  hepatic steatosis.  No signs or symptoms of bleeding -Monitor CBC -Hold home aspirin    Family Communication  : Sisters updated at bedside on 7/24  Code Status : Full  Disposition Plan  :  Patient is from home. Anticipated d/c date:  Greater than 3 days. Barriers to d/c or necessity for inpatient status:  Needs very close monitoring given the severity of his pancreatitis as evident by imaging, now active alcohol DTs requiring scheduled IV Ativan and monitoring the stepdown unit. Consults  : GI  Procedures  : MRCP, 7/22  DVT Prophylaxis  :  SCDs   Lab Results  Component Value Date   PLT 83 (L) 07/26/2019    Diet :  Diet Order            Diet clear liquid Room service appropriate? Yes; Fluid consistency: Thin  Diet effective now                  Inpatient Medications Scheduled Meds: . busPIRone  20 mg Oral BID  . Chlorhexidine Gluconate Cloth  6 each Topical Daily  . fluvoxaMINE  150 mg Oral BID  . folic acid  1 mg Oral Daily  . lisinopril  5 mg Oral Daily  . LORazepam  0-4 mg Intravenous Q4H   Followed by  . [START ON 07/28/2019] LORazepam  0-4 mg Intravenous Q8H  . mouth rinse  15 mL Mouth Rinse BID  . multivitamin with minerals  1 tablet Oral Daily  . pantoprazole (PROTONIX)  IV  40 mg Intravenous Q24H  . propranolol  20 mg Oral Daily  . thiamine  100 mg Oral Daily   Or  . thiamine  100 mg Intravenous Daily   Continuous Infusions: . lactated ringers     PRN Meds:.acetaminophen **OR** acetaminophen, hydrALAZINE, HYDROmorphone (DILAUDID) injection, LORazepam **OR** LORazepam, ondansetron **OR** ondansetron (ZOFRAN) IV, oxyCODONE  Antibiotics  :   Anti-infectives (From admission, onward)   None       Objective   Vitals:   07/27/19 0400 07/27/19 0449 07/27/19 0700 07/27/19 0800  BP: (!) 170/97  (!) 133/91   Pulse: 98  (!) 107 102  Resp: 23  (!) 26   Temp:  99.5 F (37.5 C)    TempSrc:  Oral    SpO2: 98%  97%   Weight:      Height:        SpO2:  97 % O2 Flow Rate (L/min): 2 L/min  Wt Readings from Last 3 Encounters:  07/26/19 84.7 kg  12/17/18 76.7 kg  12/13/18 76.8 kg     Intake/Output Summary (Last 24 hours) at 07/27/2019 0823 Last data filed at 07/27/2019 0450 Gross per 24 hour  Intake 2534.75 ml  Output 3175 ml  Net -640.25 ml    Physical Exam:  Alert to self, context, place.  No active visual/auditory hallucinations Respiratory effort on 2 L, scant wheezing upper airways, none heard in lower fields, no crackles Tremulous Tachycardic Abdomen soft, nondistended, decreased bowel sounds, no rebound tenderness or guarding,      I have personally reviewed the following:   Data Reviewed:  CBC Recent Labs  Lab 07/23/19 2156 07/24/19 0610 07/25/19 0517 07/26/19 0615  WBC 5.6 5.1 5.1 3.8*  HGB 13.8 14.8 12.8* 9.9*  HCT 39.1 44.1 38.2* 28.6*  PLT 146* 151 PLATELET CLUMPS NOTED ON SMEAR, UNABLE TO ESTIMATE 83*  MCV 93.3 97.1 97.7 95.7  MCH 32.9 32.6 32.7 33.1  MCHC 35.3 33.6 33.5 34.6  RDW 11.4* 11.9 11.9 11.7  LYMPHSABS  --  0.3*  --   --   MONOABS  --  0.6  --   --   EOSABS  --  0.0  --   --   BASOSABS  --  0.0  --   --     Chemistries  Recent Labs  Lab 07/23/19 2156 07/24/19 0610 07/25/19 0517 07/26/19 0615 07/27/19 0215  NA 141 142 130* 129* 133*  K 4.2 4.6 4.5 3.2* 3.8  CL 101 103 97* 93* 97*  CO2 24 24 24 27 27   GLUCOSE 128* 126* 118* 89 101*  BUN 12 17 16 10 7   CREATININE 0.56* 0.77 0.70 0.77 0.62  CALCIUM 8.6* 8.5* 7.6* 7.4* 7.5*  MG  --  1.5* 1.2*  --  2.1  AST 125* 118* 72* 61* 49*  ALT 127* 117* 65* 48* 44  ALKPHOS 84 82 50 43 52  BILITOT 0.9 1.1 1.0 1.1 1.0   ------------------------------------------------------------------------------------------------------------------ No results for input(s): CHOL, HDL, LDLCALC, TRIG, CHOLHDL, LDLDIRECT in the last 72 hours.  No results found for:  HGBA1C ------------------------------------------------------------------------------------------------------------------ No results for input(s): TSH, T4TOTAL, T3FREE, THYROIDAB in the last 72 hours.  Invalid input(s): FREET3 ------------------------------------------------------------------------------------------------------------------ No results for input(s): VITAMINB12, FOLATE, FERRITIN, TIBC, IRON, RETICCTPCT in the last 72 hours.  Coagulation profile No results for input(s): INR, PROTIME in the last 168 hours.  No results for input(s): DDIMER in the last 72 hours.  Cardiac Enzymes No results for input(s): CKMB, TROPONINI, MYOGLOBIN  in the last 168 hours.  Invalid input(s): CK ------------------------------------------------------------------------------------------------------------------ No results found for: BNP  Micro Results Recent Results (from the past 240 hour(s))  SARS Coronavirus 2 by RT PCR (hospital order, performed in [facility] hospital lab) Nasopharyngeal Nasopharyngeal Swab     Status: None   Collection Time: 07/24/19 12:08 AM   Specimen: Nasopharyngeal Swab  Result Value Ref Range Status   SARS Coronavirus 2 NEGATIVE NEGATIVE Final    Comment: (NOTE) SARS-CoV-2 target nucleic acids are NOT DETECTED.  The SARS-CoV-2 RNA is generally detectable in upper and lower respiratory specimens during the acute phase of infection. The lowest concentration of SARS-CoV-2 viral copies this assay can detect is 250 copies / mL. A negative result does not preclude SARS-CoV-2 infection and should not be used as the sole basis for treatment or other patient management decisions.  A negative result may occur with improper specimen collection / handling, submission of specimen other than nasopharyngeal swab, presence of viral mutation(s) within the areas targeted by this assay, and inadequate number of viral copies (<250 copies / mL). A negative result must be combined with  clinical observations, patient history, and epidemiological information.  Fact Sheet for Patients:   BoilerBrush.com.cy  Fact Sheet for Healthcare Providers: https://pope.com/  This test is not yet approved or  cleared by the [place] FDA and has been authorized for detection and/or diagnosis of SARS-CoV-2 by FDA under an Emergency Use Authorization (EUA).  This EUA will remain in effect (meaning this test can be used) for the duration of the COVID-19 declaration under Section 564(b)(1) of the Act, 21 U.S.C. section 360bbb-3(b)(1), unless the authorization is terminated or revoked sooner.  Performed at [facility], [address] [place], [place] [address]   Culture, blood (routine x 2)     Status: None (Preliminary result)   Collection Time: 07/26/19 11:20 AM   Specimen: BLOOD  Result Value Ref Range Status   Specimen Description   Final    BLOOD RIGHT ARM Performed at [facility], [address] [place], [place] [address]    Special Requests   Final    BOTTLES DRAWN AEROBIC AND ANAEROBIC Blood Culture adequate volume Performed at [facility], [address] [place], [place] [address]    Culture   Final    NO GROWTH < 24 HOURS Performed at [facility], [address], [place], [place] [address]    Report Status PENDING  Incomplete  Culture, blood (routine x 2)     Status: None (Preliminary result)   Collection Time: 07/26/19 11:23 AM   Specimen: BLOOD  Result Value Ref Range Status   Specimen Description   Final    BLOOD BLOOD RIGHT HAND Performed at [facility], [address] [place], [place] [address]    Special Requests   Final    BOTTLES DRAWN AEROBIC ONLY Performed at [facility], [address], [place], [place] [address]    Culture   Final    NO GROWTH < 24 HOURS Performed at [facility], [address], [place], [place] [address]    Report Status PENDING  Incomplete    Radiology Reports CT ABDOMEN PELVIS W CONTRAST  Result Date: 07/23/2019 CLINICAL DATA:  Nausea and vomiting. EXAM: CT ABDOMEN AND PELVIS WITH CONTRAST TECHNIQUE: Multidetector CT imaging of the abdomen and pelvis was performed using the standard protocol following bolus administration of intravenous contrast.  CONTRAST:  OMNIPAQUE IOHEXOL 300 MG/ML  SOLN COMPARISON:  None. FINDINGS: Lower chest: The lung bases are clear. The heart size is normal. Hepatobiliary: There is decreased hepatic attenuation suggestive of hepatic steatosis. Normal gallbladder.There is no biliary ductal dilation. Pancreas: There is diffuse peripancreatic fat stranding and free fluid. Approximately 4.6 cm of the pancreatic body and tail is hypoenhancing. Additionally, there is a heterogeneous 2.6 cm area of the pancreatic head and uncinate process that is hypoenhancing. There is no well-formed drainable fluid collection. Spleen: Unremarkable. Adrenals/Urinary Tract: --Adrenal glands: Unremarkable. --Right kidney/ureter: No hydronephrosis or radiopaque kidney stones. --Left kidney/ureter: No hydronephrosis or radiopaque kidney stones. --Urinary bladder: Unremarkable. Stomach/Bowel: --Stomach/Duodenum: No hiatal hernia or other gastric abnormality. Normal duodenal course and caliber. --Small bowel: Unremarkable. --Colon: Unremarkable. --Appendix: Not visualized. No right lower quadrant inflammation or free fluid. Vascular/Lymphatic: Normal course and caliber of the major abdominal vessels. --No retroperitoneal lymphadenopathy. --No mesenteric lymphadenopathy. --No pelvic or inguinal lymphadenopathy. Reproductive: Unremarkable Other: There is free fluid in the abdomen and pelvis the abdominal wall is normal. Musculoskeletal. No acute displaced fractures. IMPRESSION: 1. Findings consistent with acute pancreatitis. There are hypoenhancing areas of the pancreatic  tail and head concerning for developing areas of pancreatic necrosis. 2. Hepatic steatosis. 3. Small volume free fluid in the abdomen and pelvis. Electronically Signed   By: Katherine Mantle M.D.   On: 07/23/2019 23:32   MR 3D Recon At Scanner  Result Date: 07/25/2019 CLINICAL DATA:  Inpatient. Severe acute pancreatitis with elevated liver function tests. EXAM: MRI ABDOMEN WITHOUT AND WITH CONTRAST (INCLUDING MRCP) TECHNIQUE: Multiplanar multisequence MR imaging of the abdomen was performed both before and after the administration of intravenous contrast. Heavily T2-weighted images of the biliary and pancreatic ducts were obtained, and three-dimensional MRCP images were rendered by post processing. CONTRAST:  55mL GADAVIST GADOBUTROL 1 MMOL/ML IV SOLN COMPARISON:  07/23/2019 CT abdomen/pelvis. FINDINGS: Lower chest: Small dependent bilateral pleural effusions, new. Patulous lower thoracic esophagus with air-fluid level suggesting esophageal dysmotility and/or gastroesophageal reflux. Hepatobiliary: Top-normal liver size. No definite liver surface irregularity. Prominent diffuse hepatic steatosis. No liver mass. Normal gallbladder with no cholelithiasis. No biliary ductal dilatation. Common bile duct diameter 2 mm. No choledocholithiasis. No biliary masses, strictures or beading. Pancreas: Diffusely thickened and edematous pancreatic parenchyma with marked diffuse peripancreatic fat stranding and ill-defined fluid extending into anterior and posterior paranephric retroperitoneal spaces bilaterally, compatible with acute pancreatitis. There are large regions of the pancreatic head and tail demonstrating patchy T1 hyperintensity and absence of enhancement, compatible with hemorrhagic necrotizing pancreatitis, involving approximately 50% of the pancreatic parenchyma. No pancreatic duct dilation. No discrete pancreatic mass. No measurable peripancreatic fluid collections. Spleen: Normal size. No mass.  Adrenals/Urinary Tract: Normal adrenals. No hydronephrosis. Normal kidneys with no renal mass. Stomach/Bowel: Small hiatal hernia. Otherwise normal nondistended stomach. Visualized small and large bowel is normal caliber, with no bowel wall thickening. Vascular/Lymphatic: Normal caliber abdominal aorta. Patent portal, splenic, hepatic and renal veins. No pathologically enlarged lymph nodes in the abdomen. Other: Small volume abdominal ascites.  No focal fluid collection. Musculoskeletal: No aggressive appearing focal osseous lesions. IMPRESSION: 1. Acute hemorrhagic necrotizing pancreatitis involving approximately 50% of the pancreatic parenchyma. No discrete measurable peripancreatic fluid collections. 2. No biliary ductal dilatation. No cholelithiasis or choledocholithiasis. 3. Prominent diffuse hepatic steatosis. 4. Small dependent bilateral pleural effusions, new. Small volume abdominal ascites. 5. Patulous lower thoracic esophagus with air-fluid level suggesting esophageal dysmotility and/or gastroesophageal reflux. Electronically Signed   By: Jannifer Rodney.D.  On: 07/25/2019 07:54   DG Chest Portable 1 View  Result Date: 07/23/2019 CLINICAL DATA:  Vomiting, epigastric pain EXAM: PORTABLE CHEST 1 VIEW COMPARISON:  None. FINDINGS: Lungs are clear. No pneumothorax or pleural effusion. Coronary artery stenting has been performed. Cardiac size within normal limits. The pulmonary vascularity is normal. No acute bone abnormality. IMPRESSION: No active disease. Electronically Signed   By: Helyn Numbers MD   On: 07/23/2019 23:06   MR ABDOMEN MRCP W WO CONTAST  Result Date: 07/25/2019 CLINICAL DATA:  Inpatient. Severe acute pancreatitis with elevated liver function tests. EXAM: MRI ABDOMEN WITHOUT AND WITH CONTRAST (INCLUDING MRCP) TECHNIQUE: Multiplanar multisequence MR imaging of the abdomen was performed both before and after the administration of intravenous contrast. Heavily T2-weighted images of the  biliary and pancreatic ducts were obtained, and three-dimensional MRCP images were rendered by post processing. CONTRAST:  52mL GADAVIST GADOBUTROL 1 MMOL/ML IV SOLN COMPARISON:  07/23/2019 CT abdomen/pelvis. FINDINGS: Lower chest: Small dependent bilateral pleural effusions, new. Patulous lower thoracic esophagus with air-fluid level suggesting esophageal dysmotility and/or gastroesophageal reflux. Hepatobiliary: Top-normal liver size. No definite liver surface irregularity. Prominent diffuse hepatic steatosis. No liver mass. Normal gallbladder with no cholelithiasis. No biliary ductal dilatation. Common bile duct diameter 2 mm. No choledocholithiasis. No biliary masses, strictures or beading. Pancreas: Diffusely thickened and edematous pancreatic parenchyma with marked diffuse peripancreatic fat stranding and ill-defined fluid extending into anterior and posterior paranephric retroperitoneal spaces bilaterally, compatible with acute pancreatitis. There are large regions of the pancreatic head and tail demonstrating patchy T1 hyperintensity and absence of enhancement, compatible with hemorrhagic necrotizing pancreatitis, involving approximately 50% of the pancreatic parenchyma. No pancreatic duct dilation. No discrete pancreatic mass. No measurable peripancreatic fluid collections. Spleen: Normal size. No mass. Adrenals/Urinary Tract: Normal adrenals. No hydronephrosis. Normal kidneys with no renal mass. Stomach/Bowel: Small hiatal hernia. Otherwise normal nondistended stomach. Visualized small and large bowel is normal caliber, with no bowel wall thickening. Vascular/Lymphatic: Normal caliber abdominal aorta. Patent portal, splenic, hepatic and renal veins. No pathologically enlarged lymph nodes in the abdomen. Other: Small volume abdominal ascites.  No focal fluid collection. Musculoskeletal: No aggressive appearing focal osseous lesions. IMPRESSION: 1. Acute hemorrhagic necrotizing pancreatitis involving  approximately 50% of the pancreatic parenchyma. No discrete measurable peripancreatic fluid collections. 2. No biliary ductal dilatation. No cholelithiasis or choledocholithiasis. 3. Prominent diffuse hepatic steatosis. 4. Small dependent bilateral pleural effusions, new. Small volume abdominal ascites. 5. Patulous lower thoracic esophagus with air-fluid level suggesting esophageal dysmotility and/or gastroesophageal reflux. Electronically Signed   By: Delbert Phenix M.D.   On: 07/25/2019 07:54   US Abdomen Limited RUQ  Result Date: 07/25/2019 CLINICAL DATA:  Pancreatitis. EXAM: ULTRASOUND ABDOMEN LIMITED RIGHT UPPER QUADRANT COMPARISON:  Abdominal MRI 07/24/2019 FINDINGS: Gallbladder: No gallstones or wall thickening visualized. No sonographic Murphy sign noted by sonographer. Common bile duct: Diameter: 6 mm Liver: Diffusely increased parenchymal echogenicity without a focal lesion identified. Portal vein is patent on color Doppler imaging with normal direction of blood flow towards the liver. Other: Small volume perihepatic ascites and right pleural effusion. IMPRESSION: 1. Hepatic steatosis. 2. No gallstones or biliary dilatation. 3. Small volume perihepatic ascites and right pleural effusion. Electronically Signed   By: Sebastian Ache M.D.   On: 07/25/2019 09:22     Time Spent in minutes  30     Laverna Peace M.D on 07/27/2019 at 8:23 AM  To page go to [email or web address] - password [facility]

## 2019-07-28 LAB — COMPREHENSIVE METABOLIC PANEL
ALT: 39 U/L (ref 0–44)
AST: 37 U/L (ref 15–41)
Albumin: 2.3 g/dL — ABNORMAL LOW (ref 3.5–5.0)
Alkaline Phosphatase: 60 U/L (ref 38–126)
Anion gap: 9 (ref 5–15)
BUN: 9 mg/dL (ref 6–20)
CO2: 26 mmol/L (ref 22–32)
Calcium: 7.6 mg/dL — ABNORMAL LOW (ref 8.9–10.3)
Chloride: 101 mmol/L (ref 98–111)
Creatinine, Ser: 0.56 mg/dL — ABNORMAL LOW (ref 0.61–1.24)
GFR calc Af Amer: >60 mL/min (ref >60)
GFR calc non Af Amer: >60 mL/min (ref >60)
Glucose, Bld: 82 mg/dL (ref 70–99)
Potassium: 3.3 mmol/L — ABNORMAL LOW (ref 3.5–5.1)
Sodium: 136 mmol/L (ref 135–145)
Total Bilirubin: 0.8 mg/dL (ref 0.3–1.2)
Total Protein: 5.2 g/dL — ABNORMAL LOW (ref 6.5–8.1)

## 2019-07-28 LAB — FOLATE: Folate: 19.8 ng/mL (ref >5.9)

## 2019-07-28 LAB — IRON AND TIBC
Iron: 15 ug/dL — ABNORMAL LOW (ref 45–182)
Saturation Ratios: 8 % — ABNORMAL LOW (ref 17.9–39.5)
TIBC: 192 ug/dL — ABNORMAL LOW (ref 250–450)
UIBC: 177 ug/dL

## 2019-07-28 LAB — VITAMIN B12: Vitamin B-12: 612 pg/mL (ref 180–914)

## 2019-07-28 LAB — MAGNESIUM: Magnesium: 2 mg/dL (ref 1.7–2.4)

## 2019-07-28 LAB — PHOSPHORUS: Phosphorus: 3.8 mg/dL (ref 2.5–4.6)

## 2019-07-28 LAB — FERRITIN: Ferritin: 900 ng/mL — ABNORMAL HIGH (ref 24–336)

## 2019-07-28 LAB — LIPASE, BLOOD: Lipase: 30 U/L (ref 11–51)

## 2019-07-28 MED ORDER — IPRATROPIUM-ALBUTEROL 0.5-2.5 (3) MG/3ML IN SOLN
3.0000 mL | Freq: Three times a day (TID) | RESPIRATORY_TRACT | Status: DC
  Administered 2019-07-29: 3 mL via RESPIRATORY_TRACT
  Filled 2019-07-28: qty 3

## 2019-07-28 MED ORDER — LACTATED RINGERS IV SOLN: INTRAVENOUS | Status: AC

## 2019-07-28 MED ORDER — LIP MEDEX EX OINT
TOPICAL_OINTMENT | CUTANEOUS | Status: DC | PRN
  Filled 2019-07-28: qty 7

## 2019-07-28 MED ORDER — POTASSIUM CHLORIDE CRYS ER 20 MEQ PO TBCR
40.0000 meq | EXTENDED_RELEASE_TABLET | Freq: Once | ORAL | Status: AC
  Administered 2019-07-28: 40 meq via ORAL
  Filled 2019-07-28: qty 2

## 2019-07-28 MED ORDER — IPRATROPIUM-ALBUTEROL 0.5-2.5 (3) MG/3ML IN SOLN
3.0000 mL | Freq: Four times a day (QID) | RESPIRATORY_TRACT | Status: DC
  Administered 2019-07-28 (×3): 3 mL via RESPIRATORY_TRACT
  Filled 2019-07-28 (×3): qty 3

## 2019-07-28 NOTE — Progress Notes (Signed)
[facility] Progress Note    Since last GI note: He is tremulous, makes no sense when speaking (taking about taking the day off tomorrow).  Wearing mitts on his hands.  Does not appear to be in any pain  Objective: Vital signs in last 24 hours: Temp:  [99.3 F (37.4 C)-99.8 F (37.7 C)] 99.5 F (37.5 C) (07/26 0800) Pulse Rate:  [49-91] 57 (07/26 0900) Resp:  [22-36] 27 (07/26 0500) BP: (147-183)/(63-125) 171/63 (07/26 0900) SpO2:  [91 %-100 %] 100 % (07/26 0918) Last BM Date: 07/27/19 General: alert and oriented times 1 (ongoing DTs) Heart: regular rate and rythm Abdomen: soft, non-tender, non-distended, normal bowel sounds   Lab Results: Recent Labs    07/26/19 0615  WBC 3.8*  HGB 9.9*  PLT 83*  MCV 95.7   Recent Labs    07/26/19 0615 07/27/19 0215 07/28/19 0247  NA 129* 133* 136  K 3.2* 3.8 3.3*  CL 93* 97* 101  CO2 27 27 26   GLUCOSE 89 101* 82  BUN 10 7 9   CREATININE 0.77 0.62 0.56*  CALCIUM 7.4* 7.5* 7.6*   Recent Labs    07/26/19 0615 07/27/19 0215 07/28/19 0247  PROT 5.5* 5.5* 5.2*  ALBUMIN 2.7* 2.6* 2.3*  AST 61* 49* 37  ALT 48* 44 39  ALKPHOS 43 52 60  BILITOT 1.1 1.0 0.8     Medications: Scheduled Meds: . busPIRone  20 mg Oral BID  . Chlorhexidine Gluconate Cloth  6 each Topical Daily  . diazepam  5 mg Oral BID  . feeding supplement  1 Container Oral TID BM  . fluvoxaMINE  150 mg Oral BID  . folic acid  1 mg Oral Daily  . ipratropium-albuterol  3 mL Nebulization Q6H  . lisinopril  5 mg Oral Daily  . LORazepam  0-4 mg Intravenous Q4H   Followed by  . LORazepam  0-4 mg Intravenous Q8H  . mouth rinse  15 mL Mouth Rinse BID  . multivitamin with minerals  1 tablet Oral Daily  . pantoprazole (PROTONIX) IV  40 mg Intravenous Q24H  . propranolol  20 mg Oral Daily  . thiamine  100 mg Oral Daily   Or  . thiamine  100 mg Intravenous Daily   Continuous Infusions: . dexmedetomidine (PRECEDEX) IV infusion 0.7 mcg/kg/hr  (07/28/19 1019)   PRN Meds:.acetaminophen **OR** acetaminophen, hydrALAZINE, ondansetron **OR** ondansetron (ZOFRAN) IV, oxyCODONE    Assessment/Plan: 49 y.o. male with resolving alcoholic hepatitis, currenty in DTs, with necrotizing etoh pancreatitis that may be hemorrhagic as well.  DTs seem to be dominating his clinical picture currently.  Pancreatitis is severe based on imaging but clinically is more moderate (he's HD stable, no sign of organ dysfuction besides encephalopathy which is most likely due to his DTs).  Would continue current management with IV fluids (I reordered LR at 125cc/hour for now), DT precautions.  We will follow along.   07/30/19, MD  07/28/2019, 10:47 AM [facility] Pager [phone]

## 2019-07-28 NOTE — TOC Initial Note (Signed)
Transition of Care [facility]) - Initial/Assessment Note    Patient Details  Name: Gary Atkinson MRN: [id number] Date of Birth: [birth year]  [facility] CM/SW Contact:    Golda Acre, RN Phone Number: 07/28/2019, 7:54 AM  Clinical Narrative:                 s- pancreatitis and etoh w/d P- should be able to return to home with slef care when dt's are finished.  Expected Discharge Plan: Home/Self Care Barriers to Discharge: Continued Medical Work up   Patient Goals and CMS Choice Patient states their goals for this hospitalization and ongoing recovery are:: unable to state at this time CMS Medicare.gov Compare Post Acute Care list provided to:: Patient    Expected Discharge Plan and Services Expected Discharge Plan: Home/Self Care   Discharge Planning Services: CM Consult   Living arrangements for the past 2 months: Single Family Home                                      Prior Living Arrangements/Services Living arrangements for the past 2 months: Single Family Home Lives with:: Self                   Activities of Daily Living Home Assistive Devices/Equipment: None ADL Screening (condition at time of admission) Patient's cognitive ability adequate to safely complete daily activities?: Yes Is the patient deaf or have difficulty hearing?: No Does the patient have difficulty seeing, even when wearing glasses/contacts?: No Does the patient have difficulty concentrating, remembering, or making decisions?: No Patient able to express need for assistance with ADLs?: Yes Does the patient have difficulty dressing or bathing?: No Independently performs ADLs?: Yes (appropriate for developmental age) Does the patient have difficulty walking or climbing stairs?: No Weakness of Legs: None Weakness of Arms/Hands: None  Permission Sought/Granted                  Emotional Assessment Appearance:: Appears stated age     Orientation: : Fluctuating  Orientation (Suspected and/or reported Sundowners) Alcohol / Substance Use: Alcohol Use, Tobacco Use, Illicit Drugs Psych Involvement: No (comment)  Admission diagnosis:  Pancreatitis [K85.90] Acute pancreatitis, unspecified complication status, unspecified pancreatitis type [K85.90] Patient Active Problem List   Diagnosis Date Noted  . Hypophosphatemia 07/27/2019  . Hypomagnesemia 07/26/2019  . Hypokalemia 07/26/2019  . Delirium tremens (HCC) 07/25/2019  . Acute hemorrhagic pancreatitis 07/24/2019  . Intractable nausea and vomiting 07/24/2019  . Abdominal pain 07/24/2019  . GERD (gastroesophageal reflux disease) 07/24/2019  . Hiatal hernia 07/24/2019  . Elevated LFTs 07/24/2019  . Elevated troponin 07/24/2019  . Thrombocytopenia (HCC) 07/24/2019  . CAD (coronary artery disease), native coronary artery 08/25/2013  . Bradycardia 08/25/2013  . Benign essential HTN 08/25/2013  . Dyslipidemia 08/25/2013  . Iron deficiency anemia, unspecified 08/25/2013  . CAP (community acquired pneumonia) 08/24/2013   PCP:  Patient, No Pcp Per Pharmacy:   [id number] [place], [place] - [address] AT [facility] OF [address] [facility] [address] & [facility] [address] [place] [id number] Phone: [phone] Fax: [phone]     Social Determinants of Health (SDOH) Interventions    Readmission Risk Interventions No flowsheet data found.

## 2019-07-28 NOTE — Progress Notes (Addendum)
NAME:  Gary Atkinson, MRN:  [id number], DOB:  04-07-1970, LOS: 4 ADMISSION DATE:  07/23/2019, CONSULTATION DATE:  07/27/2019 REFERRING MD:  Dennison Nancy MD, CHIEF COMPLAINT:   Acute pancreatitis, alcohol withdrawal, need for Precedex  Brief History   49 year old with alcohol abuse, acute pancreatitis.  Admitted on 7/21 PCCM consulted 7/25 for active DTs and need for Precedex.  Past Medical History    has a past medical history of Allergic rhinitis, Anxiety, Asthma, CAD (coronary artery disease), Depression, GERD (gastroesophageal reflux disease), Hyperlipemia, Hypertension, MI (myocardial infarction) (HCC), and Pneumonia.  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  Right upper quadrant ultrasound 07/25/2019-hepatic steatosis.  No gallstones or biliary dilatation.  Micro Data:  Blood culture 7/24>>> ngtd>>>  Antimicrobials:    Interim history/subjective:  Agitated overnight but improving slowly.  Weaning precedex this am.  Oriented x 1, answers questions but confused.   Objective   Blood pressure (!) 183/94, pulse 66, temperature 99.5 F (37.5 C), temperature source Axillary, resp. rate (!) 27, height 5\' 11"  (1.803 m), weight 84.7 kg, SpO2 97 %.        Intake/Output Summary (Last 24 hours) at 07/28/2019 0835 Last data filed at 07/28/2019 0400 Gross per 24 hour  Intake 2683.22 ml  Output 2300 ml  Net 383.22 ml   Filed Weights   07/23/19 2152 07/24/19 0552 07/26/19 1324  Weight: 72.6 kg 74.9 kg 84.7 kg    Examination: Gen:      No acute distress in bed on precedex gtt HEENT:  EOMI, sclera anicteric Neck:     No masses; no thyromegaly Lungs:    resps even, non labored on Moore, exp wheeze throughout  CV:         Regular rate and rhythm; no murmurs Abd:      Mild abd tenderness,  hypoactive BS, soft  Ext:    No edema; adequate peripheral perfusion Skin:      Warm and dry; no rash Neuro: RASS 0, oriented x 1, answers question   Resolved Hospital Problem list     Assessment & Plan:  EtOH abuse, alcohol withdrawal Continue CIWA protocol continue Valium 5 mg twice daily precedex gtt - wean as able - may be able to wean off and get by with ativan/valium  Acute pancreatitis secondary to EtOH Improving with downtrending lipase. Tolerating clear liquid diet Continue LR for fluid hydration GI following   Bronchospasm Tobacco abuse  PLAN -  Add q6h duoneb  Pulmonary hygiene  Smoking cessation   Best practice:  Per primary team  Labs   CBC: Recent Labs  Lab 07/23/19 2156 07/24/19 0610 07/25/19 0517 07/26/19 0615  WBC 5.6 5.1 5.1 3.8*  NEUTROABS  --  4.2  --   --   HGB 13.8 14.8 12.8* 9.9*  HCT 39.1 44.1 38.2* 28.6*  MCV 93.3 97.1 97.7 95.7  PLT 146* 151 PLATELET CLUMPS NOTED ON SMEAR, UNABLE TO ESTIMATE 83*    Basic Metabolic Panel: Recent Labs  Lab 07/24/19 0610 07/25/19 0517 07/26/19 0615 07/27/19 0215 07/28/19 0247  NA 142 130* 129* 133* 136  K 4.6 4.5 3.2* 3.8 3.3*  CL 103 97* 93* 97* 101  CO2 24 24 27 27 26   GLUCOSE 126* 118* 89 101* 82  BUN 17 16 10 7 9   CREATININE 0.77 0.70 0.77 0.62  0.56*  CALCIUM 8.5* 7.6* 7.4* 7.5* 7.6*  MG 1.5* 1.2*  --  2.1 2.0  PHOS  --  3.4  --  1.9* 3.8   GFR: Estimated Creatinine Clearance: 116.3 mL/min (A) (by C-G formula based on SCr of 0.56 mg/dL (L)). Recent Labs  Lab 07/23/19 2156 07/24/19 0610 07/25/19 0517 07/26/19 0615  WBC 5.6 5.1 5.1 3.8*    Liver Function Tests: Recent Labs  Lab 07/24/19 0610 07/25/19 0517 07/26/19 0615 07/27/19 0215 07/28/19 0247  AST 118* 72* 61* 49* 37  ALT 117* 65* 48* 44 39  ALKPHOS 82 50 43 52 60  BILITOT 1.1 1.0 1.1 1.0 0.8  PROT 7.0 5.6* 5.5* 5.5* 5.2*  ALBUMIN 3.8 2.8* 2.7* 2.6* 2.3*   Recent Labs  Lab 07/24/19 0610 07/25/19 0517  07/26/19 0615 07/27/19 0215 07/28/19 0247  LIPASE 1,188* 623* 155* 56* 30   No results for input(s): AMMONIA in the last 168 hours.  ABG No results found for: PHART, PCO2ART, PO2ART, HCO3, TCO2, ACIDBASEDEF, O2SAT   Coagulation Profile: No results for input(s): INR, PROTIME in the last 168 hours.  Cardiac Enzymes: No results for input(s): CKTOTAL, CKMB, CKMBINDEX, TROPONINI in the last 168 hours.  HbA1C: No results found for: HGBA1C  CBG: No results for input(s): GLUCAP in the last 168 hours.   Critical care time:    The patient is critically ill with multiple organ system failure and requires high complexity decision making for assessment and support, frequent evaluation and titration of therapies, advanced monitoring, review of radiographic studies and interpretation of complex data.   Critical Care Time devoted to patient care services, exclusive of separately billable procedures, described in this note is 35 minutes.   Dirk Dress, NP Pulmonary/Critical Care Medicine  07/28/2019  8:35 AM

## 2019-07-28 NOTE — Progress Notes (Signed)
[facility]  PROGRESS NOTE  Gary Atkinson EAV:[id number] DOB: May 03, 1970 DOA: 07/23/2019 PCP: Patient, No Pcp Per Admit date - 07/23/2019   Admitting Physician Carlton Adam, MD  Outpatient Primary MD for the patient is Patient, No Pcp Per  LOS - 4 Brief Narrative   HPI: Gary Atkinson is a 49 y.o. male with medical history significant for GERD, HTN, chronic elevated LFTs who presents on 07/23/2019 with two days of nausea, vomiting, and abdominal pain. 2 days prior he noticed hotflashes   Noticed hotflashes 2 days ago. Ate chilli at a restaurant with a friend and again felt flushed and sweaty with profuse nausea followed by several episodes ( 6) in car during drive from [place] back to his home in [place]. Then an additional 6 times of emesis with any attempt of oral intake at home. He also endorsed abdominal pain later in the evening located in central of abdomen he used electrotherapy (TENS unit) he applied it to his back and it improved the abdominal pain. He had increased weakness and inability to even go up his stairs at home so he called the ambulance. They gave him IVF and he went to [facility].  Alcohol intake. Social drinker ( playing golf). Not daily. 6 beers a week sometimes ( light beer)  Lost 60 lbs in 6 months; he states it was intentionally in 2020 with changes in diet and increased activity at work. He has been seen at [facility] (Dr. Adela Lank) for self referral for further evaluation of reflux and weight loss (213 lbs in March and 169 during  pounds on 12 on 12/13/2018.  At that time states weight loss related to decreased appetite and early satiety; he admitted to inability to obtain protonix which previously helped because of loss of insurance in March.  He was noted to have chronically elevated LFTs. Hepatitis panel was negative and autoimmune workup was negative  He had EGD on 12/2018 which showed small hiatal hernia and mild erythema of stomach. Pathology was  negative  Denies any fevers or chills. No chest pain. No changes in medications.  Diarrhea--yesterday. ,loose stool x 1.   Acid reflux. On PPI(dexilant and nexium) on carafate. Says not well controlled. Has acidic taste in mouth. Abdominal discomfort. Hasn't filled carafate in a while  ED Course: Afebrile, BP 166/105, troponin trend 16--52, COVID neg, Lipase 1,502. AST 125, ALT 127, plt 146.  CT abd consistnet with acute pancreatitis, and concerning area of developing pancreatitic necrosis, hepatic steatosis.  Given IV protonix, IVF, IV morphine for pain control at [facility] before transfer to [facility].   Hospital course: Patient was transferred to stepdown unit on 7/24 due to worsening alcohol withdrawal with active delirium tremens requiring more frequently scheduled IV Ativan.  Patient was started on Precedex drip for better control of active delirium tremens on 7/25 with assistance from PCCM.   Subjective  To questions replies "yes vascular questions".  No acute events overnight.  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 -Continue LR infusion at 150 cc per hour per GI -likely repeat imaging once more oriented, GI following -Continue IV Dilaudid as needed severe pain  more aware, IV antiemetics, spirometry -Monitor CMP/CBC  Alcohol abuse with active alcohol withdrawal and DTs, improving.  Started on Precedex drip on 7/25 afternoon due to worsening DTs, currently stable on Precedex in addition to Ativan protocol -Continue monitoring in stepdown unit -  Appreciate PCCM assistance, on Precedex drip as able -CIWA protocol per SDU, continue scheduled Ativan and as needed for breakthrough, monitor mental status  -Thiamine, multivitamin, folic acid  Upper airway wheezing.  No crackles in lower lung fields, no signs of volume overload currently.  Suspect this is  related to positioning/upper airway disease -Scheduled inhalers, if worsens or has persistent O2 requirements will obtain chest x-ray -O2 support, wean O2 to maintain SPO2 greater than 92% -Incentive spirometry -Monitor respiratory status while on IV fluids  Hypokalemia/hypomagnesemia/hypophosphatemia.  In the setting of alcohol abuse. -Replete IV magnesium as needed, check mag daily -Replete potassium, maintain goal of 4 -Sodium phosphate for correction  Hypocalcemia.  Corrected given hypoalbuminemia to 8.4, in the setting of hypomagnesemia -Continue close monitoring CMP  Fever, resolved.  Suspect related Adrenergic system in the setting of active alcohol withdrawal.  No signs of infected necrosis on CT abdomen regarding pancreatitis. -Blood cultures -Monitor fever trend, Tylenol as needed  Pancytopenia, stable.  Likely bone marrow suppression due to alcohol abuse -Check B12, folate, iron panel -Monitor CBC, no current signs or symptoms of bleeding\  Hyponatremia, suspect hypovolemic, improving Fluid losses related to acute pancreatitis, likely also complicated by alcohol abuse/withdrawal -continue IV fluids -Monitor BMP  Transaminitis, resolved.  No biliary involvement on abdominal ultrasound/MRCP.  Did show hepatic steatosis, possible and/or resolving alcoholic hepatitis -Trend CMP  HTN.  SBP in the 170s while holding home BP meds due to change in alteration -continue home lisinopril and Inderal as mental status allows  -IV hydralazine as needed  Mood disorder, stable.  Reported history of alcohol disorder per family -continue home BuSpar, Luvox --Psych consult to assist with any change in medications given no motor dysfunction  GERD, stable -IV PPI  Elevated troponin secondary to demand ischemia, resolved Peaked at 90, has now downtrending, EKG nonischemic without chest pain.    CAD s/p DES Without chest pain, nonischemic EKG -Continue home aspirin once tolerating  orals  Thrombocytopenia, acute Could be related to hepatic steatosis.  No signs or symptoms of bleeding -Monitor CBC -Hold home aspirin    Family Communication  : Sisters updated at bedside on 7/24  Code Status : Full  Disposition Plan  :  Patient is from home. Anticipated d/c date:  Greater than 3 days. Barriers to d/c or necessity for inpatient status:  Needs very close monitoring given the severity of his pancreatitis as evident by imaging, now active alcohol DTs requiring scheduled IV Ativan and monitoring the stepdown unit. Consults  : GI  Procedures  : MRCP, 7/22  DVT Prophylaxis  :  SCDs   Lab Results  Component Value Date   PLT 83 (L) 07/26/2019    Diet :  Diet Order            Diet full liquid Room service appropriate? Yes; Fluid consistency: Thin  Diet effective now                  Inpatient Medications Scheduled Meds:  busPIRone  20 mg Oral BID   Chlorhexidine Gluconate Cloth  6 each Topical Daily   diazepam  5 mg Oral BID   feeding supplement  1 Container Oral TID BM   fluvoxaMINE  150 mg Oral BID   folic acid  1 mg Oral Daily   ipratropium-albuterol  3 mL Nebulization Q6H   lisinopril  5 mg Oral Daily   LORazepam  0-4 mg Intravenous Q4H   Followed by   LORazepam  0-4  mg Intravenous Q8H   mouth rinse  15 mL Mouth Rinse BID   multivitamin with minerals  1 tablet Oral Daily   pantoprazole (PROTONIX) IV  40 mg Intravenous Q24H   potassium chloride  40 mEq Oral Once   propranolol  20 mg Oral Daily   thiamine  100 mg Oral Daily   Or   thiamine  100 mg Intravenous Daily   Continuous Infusions:  dexmedetomidine (PRECEDEX) IV infusion 1.2 mcg/kg/hr (07/28/19 0736)   PRN Meds:.acetaminophen **OR** acetaminophen, hydrALAZINE, LORazepam **OR** LORazepam, ondansetron **OR** ondansetron (ZOFRAN) IV, oxyCODONE  Antibiotics  :   Anti-infectives (From admission, onward)   None       Objective   Vitals:   07/28/19 0400 07/28/19  0500 07/28/19 0600 07/28/19 0800  BP: (!) 153/97  (!) 172/96 (!) 183/94  Pulse: 83 76 49 66  Resp: (!) 27 (!) 27    Temp: 99.3 F (37.4 C)   99.5 F (37.5 C)  TempSrc: Axillary   Axillary  SpO2: 99% 97%    Weight:      Height:        SpO2: 97 % O2 Flow Rate (L/min): 2 L/min  Wt Readings from Last 3 Encounters:  07/26/19 84.7 kg  12/17/18 76.7 kg  12/13/18 76.8 kg     Intake/Output Summary (Last 24 hours) at 07/28/2019 0846 Last data filed at 07/28/2019 0400 Gross per 24 hour  Intake 2683.22 ml  Output 2300 ml  Net 383.22 ml    Physical Exam:  Alert to self, confused, disoriented, still following commands Normal respiratory effort on 2 L, upper airway wheezing, tachypnea, none heard in lower fields, no crackles Tremulous Tachycardic, regular rate and rhythm Abdomen soft, nondistended, decreased bowel sounds, no rebound tenderness or guarding,      I have personally reviewed the following:   Data Reviewed:  CBC Recent Labs  Lab 07/23/19 2156 07/24/19 0610 07/25/19 0517 07/26/19 0615  WBC 5.6 5.1 5.1 3.8*  HGB 13.8 14.8 12.8* 9.9*  HCT 39.1 44.1 38.2* 28.6*  PLT 146* 151 PLATELET CLUMPS NOTED ON SMEAR, UNABLE TO ESTIMATE 83*  MCV 93.3 97.1 97.7 95.7  MCH 32.9 32.6 32.7 33.1  MCHC 35.3 33.6 33.5 34.6  RDW 11.4* 11.9 11.9 11.7  LYMPHSABS  --  0.3*  --   --   MONOABS  --  0.6  --   --   EOSABS  --  0.0  --   --   BASOSABS  --  0.0  --   --     Chemistries  Recent Labs  Lab 07/24/19 0610 07/25/19 0517 07/26/19 0615 07/27/19 0215 07/28/19 0247  NA 142 130* 129* 133* 136  K 4.6 4.5 3.2* 3.8 3.3*  CL 103 97* 93* 97* 101  CO2 24 24 27 27 26   GLUCOSE 126* 118* 89 101* 82  BUN 17 16 10 7 9   CREATININE 0.77 0.70 0.77 0.62 0.56*  CALCIUM 8.5* 7.6* 7.4* 7.5* 7.6*  MG 1.5* 1.2*  --  2.1 2.0  AST 118* 72* 61* 49* 37  ALT 117* 65* 48* 44 39  ALKPHOS 82 50 43 52 60  BILITOT 1.1 1.0 1.1 1.0 0.8    ------------------------------------------------------------------------------------------------------------------ No results for input(s): CHOL, HDL, LDLCALC, TRIG, CHOLHDL, LDLDIRECT in the last 72 hours.  No results found for: HGBA1C ------------------------------------------------------------------------------------------------------------------ No results for input(s): TSH, T4TOTAL, T3FREE, THYROIDAB in the last 72 hours.  Invalid input(s): FREET3 ------------------------------------------------------------------------------------------------------------------ No results for input(s): VITAMINB12, FOLATE, FERRITIN, TIBC,  IRON, RETICCTPCT in the last 72 hours.  Coagulation profile No results for input(s): INR, PROTIME in the last 168 hours.  No results for input(s): DDIMER in the last 72 hours.  Cardiac Enzymes No results for input(s): CKMB, TROPONINI, MYOGLOBIN in the last 168 hours.  Invalid input(s): CK ------------------------------------------------------------------------------------------------------------------ No results found for: BNP  Micro Results Recent Results (from the past 240 hour(s))  SARS Coronavirus 2 by RT PCR (hospital order, performed in [facility] hospital lab) Nasopharyngeal Nasopharyngeal Swab     Status: None   Collection Time: 07/24/19 12:08 AM   Specimen: Nasopharyngeal Swab  Result Value Ref Range Status   SARS Coronavirus 2 NEGATIVE NEGATIVE Final    Comment: (NOTE) SARS-CoV-2 target nucleic acids are NOT DETECTED.  The SARS-CoV-2 RNA is generally detectable in upper and lower respiratory specimens during the acute phase of infection. The lowest concentration of SARS-CoV-2 viral copies this assay can detect is 250 copies / mL. A negative result does not preclude SARS-CoV-2 infection and should not be used as the sole basis for treatment or other patient management decisions.  A negative result may occur with improper specimen collection /  handling, submission of specimen other than nasopharyngeal swab, presence of viral mutation(s) within the areas targeted by this assay, and inadequate number of viral copies (<250 copies / mL). A negative result must be combined with clinical observations, patient history, and epidemiological information.  Fact Sheet for Patients:   BoilerBrush.com.cy  Fact Sheet for Healthcare Providers: https://pope.com/  This test is not yet approved or  cleared by the [place] FDA and has been authorized for detection and/or diagnosis of SARS-CoV-2 by FDA under an Emergency Use Authorization (EUA).  This EUA will remain in effect (meaning this test can be used) for the duration of the COVID-19 declaration under Section 564(b)(1) of the Act, 21 U.S.C. section 360bbb-3(b)(1), unless the authorization is terminated or revoked sooner.  Performed at [facility], [address] [place], [place] [address]   Culture, blood (routine x 2)     Status: None (Preliminary result)   Collection Time: 07/26/19 11:20 AM   Specimen: BLOOD  Result Value Ref Range Status   Specimen Description   Final    BLOOD RIGHT ARM Performed at [facility], [address], [place], [place] [address]    Special Requests   Final    BOTTLES DRAWN AEROBIC AND ANAEROBIC Blood Culture adequate volume Performed at [facility], [address], [place], [place] [address]    Culture   Final    NO GROWTH < 24 HOURS Performed at [facility], [address] [place], [place] [address]    Report Status PENDING  Incomplete  Culture, blood (routine x 2)     Status: None (Preliminary result)   Collection Time: 07/26/19 11:23 AM   Specimen: BLOOD  Result Value Ref Range Status   Specimen Description   Final    BLOOD BLOOD RIGHT HAND Performed at [facility] [address], [place], [place] [address]     Special Requests   Final    BOTTLES DRAWN AEROBIC ONLY Performed at [facility], [address], [place], [place] [address]    Culture   Final    NO GROWTH < 24 HOURS Performed at [facility], [address] [place], [place] [address]    Report Status PENDING  Incomplete  MRSA PCR Screening  Status: None   Collection Time: 07/27/19  8:20 AM   Specimen: Nasal Mucosa; Nasopharyngeal  Result Value Ref Range Status   MRSA by PCR NEGATIVE NEGATIVE Final    Comment:        The GeneXpert MRSA Assay (FDA approved for NASAL specimens only), is one component of a comprehensive MRSA colonization surveillance program. It is not intended to diagnose MRSA infection nor to guide or monitor treatment for MRSA infections. Performed at [facility], [address] [place], [place] [address]     Radiology Reports CT ABDOMEN PELVIS W CONTRAST  Result Date: 07/23/2019 CLINICAL DATA:  Nausea and vomiting. EXAM: CT ABDOMEN AND PELVIS WITH CONTRAST TECHNIQUE: Multidetector CT imaging of the abdomen and pelvis was performed using the standard protocol following bolus administration of intravenous contrast. CONTRAST:  OMNIPAQUE IOHEXOL 300 MG/ML  SOLN COMPARISON:  None. FINDINGS: Lower chest: The lung bases are clear. The heart size is normal. Hepatobiliary: There is decreased hepatic attenuation suggestive of hepatic steatosis. Normal gallbladder.There is no biliary ductal dilation. Pancreas: There is diffuse peripancreatic fat stranding and free fluid. Approximately 4.6 cm of the pancreatic body and tail is hypoenhancing. Additionally, there is a heterogeneous 2.6 cm area of the pancreatic head and uncinate process that is hypoenhancing. There is no well-formed drainable fluid collection. Spleen: Unremarkable. Adrenals/Urinary Tract: --Adrenal glands: Unremarkable. --Right kidney/ureter: No hydronephrosis or radiopaque kidney stones. --Left  kidney/ureter: No hydronephrosis or radiopaque kidney stones. --Urinary bladder: Unremarkable. Stomach/Bowel: --Stomach/Duodenum: No hiatal hernia or other gastric abnormality. Normal duodenal course and caliber. --Small bowel: Unremarkable. --Colon: Unremarkable. --Appendix: Not visualized. No right lower quadrant inflammation or free fluid. Vascular/Lymphatic: Normal course and caliber of the major abdominal vessels. --No retroperitoneal lymphadenopathy. --No mesenteric lymphadenopathy. --No pelvic or inguinal lymphadenopathy. Reproductive: Unremarkable Other: There is free fluid in the abdomen and pelvis the abdominal wall is normal. Musculoskeletal. No acute displaced fractures. IMPRESSION: 1. Findings consistent with acute pancreatitis. There are hypoenhancing areas of the pancreatic tail and head concerning for developing areas of pancreatic necrosis. 2. Hepatic steatosis. 3. Small volume free fluid in the abdomen and pelvis. Electronically Signed   By: Katherine Mantle M.D.   On: 07/23/2019 23:32   MR 3D Recon At Scanner  Result Date: 07/25/2019 CLINICAL DATA:  Inpatient. Severe acute pancreatitis with elevated liver function tests. EXAM: MRI ABDOMEN WITHOUT AND WITH CONTRAST (INCLUDING MRCP) TECHNIQUE: Multiplanar multisequence MR imaging of the abdomen was performed both before and after the administration of intravenous contrast. Heavily T2-weighted images of the biliary and pancreatic ducts were obtained, and three-dimensional MRCP images were rendered by post processing. CONTRAST:  55mL GADAVIST GADOBUTROL 1 MMOL/ML IV SOLN COMPARISON:  07/23/2019 CT abdomen/pelvis. FINDINGS: Lower chest: Small dependent bilateral pleural effusions, new. Patulous lower thoracic esophagus with air-fluid level suggesting esophageal dysmotility and/or gastroesophageal reflux. Hepatobiliary: Top-normal liver size. No definite liver surface irregularity. Prominent diffuse hepatic steatosis. No liver mass. Normal  gallbladder with no cholelithiasis. No biliary ductal dilatation. Common bile duct diameter 2 mm. No choledocholithiasis. No biliary masses, strictures or beading. Pancreas: Diffusely thickened and edematous pancreatic parenchyma with marked diffuse peripancreatic fat stranding and ill-defined fluid extending into anterior and posterior paranephric retroperitoneal spaces bilaterally, compatible with acute pancreatitis. There are large regions of the pancreatic head and tail demonstrating patchy T1 hyperintensity and absence of enhancement, compatible with hemorrhagic necrotizing pancreatitis, involving approximately 50% of the pancreatic parenchyma. No pancreatic duct dilation. No discrete pancreatic mass. No measurable peripancreatic fluid collections. Spleen: Normal  size. No mass. Adrenals/Urinary Tract: Normal adrenals. No hydronephrosis. Normal kidneys with no renal mass. Stomach/Bowel: Small hiatal hernia. Otherwise normal nondistended stomach. Visualized small and large bowel is normal caliber, with no bowel wall thickening. Vascular/Lymphatic: Normal caliber abdominal aorta. Patent portal, splenic, hepatic and renal veins. No pathologically enlarged lymph nodes in the abdomen. Other: Small volume abdominal ascites.  No focal fluid collection. Musculoskeletal: No aggressive appearing focal osseous lesions. IMPRESSION: 1. Acute hemorrhagic necrotizing pancreatitis involving approximately 50% of the pancreatic parenchyma. No discrete measurable peripancreatic fluid collections. 2. No biliary ductal dilatation. No cholelithiasis or choledocholithiasis. 3. Prominent diffuse hepatic steatosis. 4. Small dependent bilateral pleural effusions, new. Small volume abdominal ascites. 5. Patulous lower thoracic esophagus with air-fluid level suggesting esophageal dysmotility and/or gastroesophageal reflux. Electronically Signed   By: Delbert PhenixJason A Poff M.D.   On: 07/25/2019 07:54   DG Chest Portable 1 View  Result Date:  07/23/2019 CLINICAL DATA:  Vomiting, epigastric pain EXAM: PORTABLE CHEST 1 VIEW COMPARISON:  None. FINDINGS: Lungs are clear. No pneumothorax or pleural effusion. Coronary artery stenting has been performed. Cardiac size within normal limits. The pulmonary vascularity is normal. No acute bone abnormality. IMPRESSION: No active disease. Electronically Signed   By: Helyn NumbersAshesh  Parikh MD   On: 07/23/2019 23:06   MR ABDOMEN MRCP W WO CONTAST  Result Date: 07/25/2019 CLINICAL DATA:  Inpatient. Severe acute pancreatitis with elevated liver function tests. EXAM: MRI ABDOMEN WITHOUT AND WITH CONTRAST (INCLUDING MRCP) TECHNIQUE: Multiplanar multisequence MR imaging of the abdomen was performed both before and after the administration of intravenous contrast. Heavily T2-weighted images of the biliary and pancreatic ducts were obtained, and three-dimensional MRCP images were rendered by post processing. CONTRAST:  8mL GADAVIST GADOBUTROL 1 MMOL/ML IV SOLN COMPARISON:  07/23/2019 CT abdomen/pelvis. FINDINGS: Lower chest: Small dependent bilateral pleural effusions, new. Patulous lower thoracic esophagus with air-fluid level suggesting esophageal dysmotility and/or gastroesophageal reflux. Hepatobiliary: Top-normal liver size. No definite liver surface irregularity. Prominent diffuse hepatic steatosis. No liver mass. Normal gallbladder with no cholelithiasis. No biliary ductal dilatation. Common bile duct diameter 2 mm. No choledocholithiasis. No biliary masses, strictures or beading. Pancreas: Diffusely thickened and edematous pancreatic parenchyma with marked diffuse peripancreatic fat stranding and ill-defined fluid extending into anterior and posterior paranephric retroperitoneal spaces bilaterally, compatible with acute pancreatitis. There are large regions of the pancreatic head and tail demonstrating patchy T1 hyperintensity and absence of enhancement, compatible with hemorrhagic necrotizing pancreatitis, involving  approximately 50% of the pancreatic parenchyma. No pancreatic duct dilation. No discrete pancreatic mass. No measurable peripancreatic fluid collections. Spleen: Normal size. No mass. Adrenals/Urinary Tract: Normal adrenals. No hydronephrosis. Normal kidneys with no renal mass. Stomach/Bowel: Small hiatal hernia. Otherwise normal nondistended stomach. Visualized small and large bowel is normal caliber, with no bowel wall thickening. Vascular/Lymphatic: Normal caliber abdominal aorta. Patent portal, splenic, hepatic and renal veins. No pathologically enlarged lymph nodes in the abdomen. Other: Small volume abdominal ascites.  No focal fluid collection. Musculoskeletal: No aggressive appearing focal osseous lesions. IMPRESSION: 1. Acute hemorrhagic necrotizing pancreatitis involving approximately 50% of the pancreatic parenchyma. No discrete measurable peripancreatic fluid collections. 2. No biliary ductal dilatation. No cholelithiasis or choledocholithiasis. 3. Prominent diffuse hepatic steatosis. 4. Small dependent bilateral pleural effusions, new. Small volume abdominal ascites. 5. Patulous lower thoracic esophagus with air-fluid level suggesting esophageal dysmotility and/or gastroesophageal reflux. Electronically Signed   By: Delbert PhenixJason A Poff M.D.   On: 07/25/2019 07:54   US Abdomen Limited RUQ  Result Date: 07/25/2019 CLINICAL DATA:  Pancreatitis. EXAM:  ULTRASOUND ABDOMEN LIMITED RIGHT UPPER QUADRANT COMPARISON:  Abdominal MRI 07/24/2019 FINDINGS: Gallbladder: No gallstones or wall thickening visualized. No sonographic Murphy sign noted by sonographer. Common bile duct: Diameter: 6 mm Liver: Diffusely increased parenchymal echogenicity without a focal lesion identified. Portal vein is patent on color Doppler imaging with normal direction of blood flow towards the liver. Other: Small volume perihepatic ascites and right pleural effusion. IMPRESSION: 1. Hepatic steatosis. 2. No gallstones or biliary dilatation. 3.  Small volume perihepatic ascites and right pleural effusion. Electronically Signed   By: Sebastian Ache M.D.   On: 07/25/2019 09:22     Time Spent in minutes  30     Laverna Peace M.D on 07/28/2019 at 8:46 AM  To page go to [email or web address] - password [facility]

## 2019-07-29 ENCOUNTER — Inpatient Hospital Stay (HOSPITAL_COMMUNITY): Payer: 59

## 2019-07-29 DIAGNOSIS — J90 Pleural effusion, not elsewhere classified: Secondary | ICD-10-CM

## 2019-07-29 LAB — CBC
HCT: 29.7 % — ABNORMAL LOW (ref 39.0–52.0)
Hemoglobin: 9.8 g/dL — ABNORMAL LOW (ref 13.0–17.0)
MCH: 32.3 pg (ref 26.0–34.0)
MCHC: 33 g/dL (ref 30.0–36.0)
MCV: 98 fL (ref 80.0–100.0)
Platelets: 149 10*3/uL — ABNORMAL LOW (ref 150–400)
RBC: 3.03 MIL/uL — ABNORMAL LOW (ref 4.22–5.81)
RDW: 11.9 % (ref 11.5–15.5)
WBC: 5.9 10*3/uL (ref 4.0–10.5)
nRBC: 0 % (ref 0.0–0.2)

## 2019-07-29 LAB — BASIC METABOLIC PANEL
Anion gap: 11 (ref 5–15)
BUN: 12 mg/dL (ref 6–20)
CO2: 24 mmol/L (ref 22–32)
Calcium: 7.8 mg/dL — ABNORMAL LOW (ref 8.9–10.3)
Chloride: 101 mmol/L (ref 98–111)
Creatinine, Ser: 0.56 mg/dL — ABNORMAL LOW (ref 0.61–1.24)
GFR calc Af Amer: >60 mL/min (ref >60)
GFR calc non Af Amer: >60 mL/min (ref >60)
Glucose, Bld: 81 mg/dL (ref 70–99)
Potassium: 3.6 mmol/L (ref 3.5–5.1)
Sodium: 136 mmol/L (ref 135–145)

## 2019-07-29 LAB — LIPASE, BLOOD: Lipase: 26 U/L (ref 11–51)

## 2019-07-29 LAB — MAGNESIUM: Magnesium: 1.8 mg/dL (ref 1.7–2.4)

## 2019-07-29 LAB — PHOSPHORUS: Phosphorus: 3.9 mg/dL (ref 2.5–4.6)

## 2019-07-29 MED ORDER — FUROSEMIDE 10 MG/ML IJ SOLN
20.0000 mg | Freq: Once | INTRAMUSCULAR | Status: AC
  Administered 2019-07-29: 20 mg via INTRAVENOUS
  Filled 2019-07-29: qty 2

## 2019-07-29 MED ORDER — LISINOPRIL 2.5 MG PO TABS
5.0000 mg | ORAL_TABLET | Freq: Once | ORAL | Status: AC
  Administered 2019-07-29: 5 mg via ORAL
  Filled 2019-07-29: qty 2

## 2019-07-29 MED ORDER — IPRATROPIUM-ALBUTEROL 0.5-2.5 (3) MG/3ML IN SOLN
3.0000 mL | Freq: Two times a day (BID) | RESPIRATORY_TRACT | Status: DC
  Administered 2019-07-29 – 2019-07-30 (×2): 3 mL via RESPIRATORY_TRACT
  Filled 2019-07-29 (×2): qty 3

## 2019-07-29 MED ORDER — MAGNESIUM SULFATE 2 GM/50ML IV SOLN
2.0000 g | Freq: Once | INTRAVENOUS | Status: AC
  Administered 2019-07-29: 2 g via INTRAVENOUS
  Filled 2019-07-29: qty 50

## 2019-07-29 MED ORDER — FUROSEMIDE 10 MG/ML IJ SOLN
INTRAMUSCULAR | Status: AC
  Filled 2019-07-29: qty 2

## 2019-07-29 MED ORDER — POTASSIUM CHLORIDE 20 MEQ PO PACK
40.0000 meq | PACK | Freq: Once | ORAL | Status: AC
  Administered 2019-07-29: 40 meq via ORAL
  Filled 2019-07-29: qty 2

## 2019-07-29 MED ORDER — LISINOPRIL 10 MG PO TABS
10.0000 mg | ORAL_TABLET | Freq: Every day | ORAL | Status: DC
  Administered 2019-07-30 – 2019-08-06 (×8): 10 mg via ORAL
  Filled 2019-07-29 (×8): qty 1

## 2019-07-29 NOTE — Progress Notes (Signed)
Progress Note  CC:    pancreatitis      ASSESSMENT AND PLAN:   # Necrotizing Etoh pancreatitis with hemorrhagic component.  --Normal WBC. Hct 29%, normal renal function --Day # 5 hospitalization --As DTs / mental status improves will need to try giving PO. --Continue IVF --Will need repeat abdominal imaging in a few days.      # Elevated LFTs, likely Etoh hepatitis --LFTs have normalized   # Alcoholism  --Being treated for DTs. Still some intermittent confusion but medications likely contributing.      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 Says abdominal pain is a little better today, says 7 / 10 on pain scale   OBJECTIVE:     Vital signs in last 24 hours: Temp:  [96.8 F (36 C)-98.8 F (37.1 C)] 96.8 F (36 C) (07/27 0800) Pulse Rate:  [31-80] 68 (07/27 0800) Resp:  [17-35] 24 (07/27 0800) BP: (136-175)/(63-160) 170/88 (07/27 0800) SpO2:  [96 %-100 %] 99 % (07/27 0857) Last BM Date: 07/28/19 General:   Groggy but can hold short conversation. NAD.  Heart:  Regular rate and rhythm.  No lower extremity edema   Pulm: Normal respiratory effort   Abdomen:  Soft,  nontender, nondistended.  Normal bowel sounds.          Neurologic:  Alert and  oriented,  grossly normal neurologically. Psych:  Pleasant, cooperative.  Normal mood and affect.   Intake/Output from previous day: 07/26 0701 - 07/27 0700 In: 4341.5 [P.O.:1600; I.V.:2741.5] Out: 1025 [Urine:1025] Intake/Output this shift: Total I/O In: 326.5 [I.V.:326.5] Out: -   Lab Results: Recent Labs    07/29/19 0151  WBC 5.9  HGB 9.8*  HCT 29.7*  PLT 149*   BMET Recent Labs    07/27/19 0215 07/28/19 0247 07/29/19 0151  NA 133* 136 136  K 3.8 3.3* 3.6  CL 97* 101 101  CO2 27 26 24   GLUCOSE 101* 82 81  BUN 7 9 12   CREATININE 0.62 0.56* 0.56*  CALCIUM 7.5* 7.6* 7.8*   LFT Recent Labs    07/28/19 0247   PROT 5.2*  ALBUMIN 2.3*  AST 37  ALT 39  ALKPHOS 60  BILITOT 0.8   PT/INR No results for input(s): LABPROT, INR in the last 72 hours. Hepatitis Panel No results for input(s): HEPBSAG, HCVAB, HEPAIGM, HEPBIGM in the last 72 hours.  DG Chest Port 1 View  Result Date: 07/29/2019 CLINICAL DATA:  Respiratory distress. EXAM: PORTABLE CHEST 1 VIEW COMPARISON:  Radiograph 07/23/2019. FINDINGS: 0929 hours. The heart size is at the upper limits of normal for portable AP technique. There are lower lung volumes with new vascular congestion and probable edema. There are small bilateral pleural effusions with left-greater-than-right basilar airspace opacities. No pneumothorax. The bones appear intact. Telemetry leads overlie the chest. IMPRESSION: Lower lung volumes with new vascular congestion, probable edema and small bilateral pleural effusions suggesting congestive heart failure. Bibasilar airspace opacities may reflect atelectasis or pneumonia. Electronically Signed   By: 07/31/2019 M.D.   On: 07/29/2019 09:52      Active Problems:   Benign essential HTN   Acute hemorrhagic pancreatitis   Intractable nausea and vomiting   Abdominal pain   GERD (gastroesophageal reflux disease)   Hiatal hernia   Elevated LFTs   Elevated troponin   Thrombocytopenia (HCC)   Delirium tremens (HCC)   Hypomagnesemia   Hypokalemia  Hypophosphatemia     LOS: 5 days   Willette Cluster ,NP 07/29/2019, 9:57 AM

## 2019-07-29 NOTE — Progress Notes (Addendum)
[facility]  PROGRESS NOTE  Gary Atkinson ZOX:[id number] DOB: [birth year] DOA: 07/23/2019 PCP: Patient, No Pcp Per Admit date - 07/23/2019   Admitting Physician Carlton Adam, MD  Outpatient Primary MD for the patient is Patient, No Pcp Per  LOS - 5 Brief Narrative   HPI: Gary Atkinson is a 49 y.o. male with medical history significant for GERD, HTN, alcohol use (reports being a social drinker/binge drinker) chronic elevated LFTs who presented on 07/23/2019 with two days of nausea, multiple episodes of nonbilious/nonbloody vomiting, and abdominal pain and was found to have an elevated lipase of 1500, AST and ALT elevated with CT abdomen consistent with acute pancreatitis and concern for the development of necrosis.    Hospital course: Patient was given IV Protonix, IV fluids and IV morphine for pain control at ED before transfer to [facility] where he received LR infusion.  GI was consulted given patient's CT abdomen findings (followed by  GI, Dr. Adela Lank).  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    Patient was transferred to stepdown unit on 7/24 due to worsening alcohol withdrawal with active delirium tremens requiring more frequently scheduled IV Ativan and started on precedex drip for better control of active delirium tremens on 7/25 with assistance from PCCM.   Subjective  States he is at "[facility]", thinks his belly pain is a bit better.  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 -Continue LR infusion at 150 cc per hour per GI -likely repeat imaging this week once more oriented, GI following -Continue IV Dilaudid as needed severe pain  more aware, IV  antiemetics, spirometry -Monitor CMP/CBC  Alcohol abuse with active alcohol withdrawal and DTs, improving.  Started on Precedex drip on 7/25  due to worsening DTs, currently stable on Precedex in addition to Ativan protocol -Continue monitoring in stepdown unit -Appreciate PCCM assistance, on Precedex drip as able, will attempt to wean -CIWA protocol per SDU, continue scheduled Ativan and as needed for breakthrough, monitor mental status  -Thiamine, multivitamin, folic acid  Upper airway wheezing.  On chest x-ray shows bilateral pleural effusions.-In the setting of ongoing IV fluids.  Patient needs IV fluids per GI for pancreatitis -Start gentle diuresis with IV Lasix 20 mg, monitor output -Currently on 2 L O2 but has maintain SPO2 more than 96%, has not had any hypoxia -Scheduled duo nebs inhalers,  -Incentive spirometry -Monitor respiratory status while on IV fluids  Hypokalemia/hypomagnesemia/hypophosphatemia.  In the setting of alcohol abuse. -Replete IV magnesium as needed, check mag daily -Replete potassium, maintain goal of 4 -Sodium phosphate for correction  Hypocalcemia.  Corrected given hypoalbuminemia to 8.4, in the setting of hypomagnesemia -Continue close monitoring CMP  Fever, resolved.  Suspect related Adrenergic system in the setting of active alcohol withdrawal.  No signs of infected necrosis on CT abdomen regarding pancreatitis. -Blood cultures -Monitor fever trend, Tylenol as needed  Pancytopenia, stable.  Likely bone marrow suppression due to alcohol abuse -Check B12, folate, iron panel -Monitor CBC, no current signs or symptoms of bleeding\  Hyponatremia, suspect hypovolemic, improving Fluid losses related to acute pancreatitis, likely also complicated by alcohol abuse/withdrawal -continue IV fluids -Monitor BMP  Transaminitis, resolved.  No biliary involvement on abdominal ultrasound/MRCP.  Did show hepatic steatosis, possible and/or resolving alcoholic  hepatitis -Trend CMP  HTN.  Labile, current SBP in the 150s, has been up as high as 170s.  In setting of changes in mentation making it difficult for him to consistently take oral medications -continue home lisinopril increase to 10 mg, as long as mentation allows -Continue home  Inderal as mental status allows  -IV hydralazine as needed  Mood disorder, stable.  Reported history of alcohol disorder per family -continue home BuSpar, Luvox --Psych consult to assist with any change in medications once medically stable  GERD, stable -IV PPI  Elevated troponin secondary to demand ischemia, resolved Peaked at 90, has now downtrending, EKG nonischemic without chest pain.    CAD s/p DES Without chest pain, nonischemic EKG -Continue home aspirin once tolerating orals  Thrombocytopenia, acute Could be related to hepatic steatosis.  No signs or symptoms of bleeding -Monitor CBC -Hold home aspirin    Family Communication  : Sister tabitha updated on phone [phone]  Code Status : Full  Disposition Plan  :  Patient is from home. Anticipated d/c date:  Greater than 3 days. Barriers to d/c or necessity for inpatient status:  Needs very close monitoring given the severity of his pancreatitis as evident by imaging, now active alcohol DTs requiring scheduled IV Ativan and precedex, with IV lasix for pleural effusions and monitoring the stepdown unit. Consults  : GI  Procedures  : MRCP, 7/22  DVT Prophylaxis  :  SCDs   Lab Results  Component Value Date   PLT 149 (L) 07/29/2019    Diet :  Diet Order            Diet full liquid Room service appropriate? Yes; Fluid consistency: Thin  Diet effective now                  Inpatient Medications Scheduled Meds: . busPIRone  20 mg Oral BID  . Chlorhexidine Gluconate Cloth  6 each Topical Daily  . diazepam  5 mg Oral BID  . feeding supplement  1 Container Oral TID BM  . fluvoxaMINE  150 mg Oral BID  . folic acid  1 mg Oral Daily   . furosemide  20 mg Intravenous Once  . ipratropium-albuterol  3 mL Nebulization BID  . lisinopril  5 mg Oral Daily  . LORazepam  0-4 mg Intravenous Q8H  . mouth rinse  15 mL Mouth Rinse BID  . multivitamin with minerals  1 tablet Oral Daily  . pantoprazole (PROTONIX) IV  40 mg Intravenous Q24H  . propranolol  20 mg Oral Daily  . thiamine  100 mg Oral Daily   Or  . thiamine  100 mg Intravenous Daily   Continuous Infusions: . dexmedetomidine (PRECEDEX) IV infusion 0.6 mcg/kg/hr (07/29/19 1016)   PRN Meds:.acetaminophen **OR** acetaminophen, hydrALAZINE, lip balm, ondansetron **OR** ondansetron (ZOFRAN) IV, oxyCODONE  Antibiotics  :   Anti-infectives (From admission, onward)   None       Objective   Vitals:   07/29/19 0857 07/29/19 0900 07/29/19 1000 07/29/19 1100  BP:  (!) 153/82 (!) 175/94 (!) 157/77  Pulse:  77 62 58  Resp:  (!) 37 23 20  Temp:      TempSrc:      SpO2: 99% 100% 100% 100%  Weight:      Height:        SpO2: 100 % O2 Flow Rate (L/min): 2 L/min  Wt Readings from Last 3 Encounters:  07/26/19 84.7 kg  12/17/18 76.7 kg  12/13/18  76.8 kg     Intake/Output Summary (Last 24 hours) at 07/29/2019 1149 Last data filed at 07/29/2019 0820 Gross per 24 hour  Intake 3967.96 ml  Output 1025 ml  Net 2942.96 ml    Physical Exam:  Alert to self, confused, oriented to self, place (hospital), time, context, still following commands Normal respiratory effort on 2 L, scant wheezing heard, crackles at bilateral bases with diminished breath sounds bilaterally,  Tremulous Tachycardic, regular rate and rhythm Abdomen soft, nondistended, decreased bowel sounds, no rebound tenderness or guarding,      I have personally reviewed the following:   Data Reviewed:  CBC Recent Labs  Lab 07/23/19 2156 07/24/19 0610 07/25/19 0517 07/26/19 0615 07/29/19 0151  WBC 5.6 5.1 5.1 3.8* 5.9  HGB 13.8 14.8 12.8* 9.9* 9.8*  HCT 39.1 44.1 38.2* 28.6* 29.7*  PLT 146*  151 PLATELET CLUMPS NOTED ON SMEAR, UNABLE TO ESTIMATE 83* 149*  MCV 93.3 97.1 97.7 95.7 98.0  MCH 32.9 32.6 32.7 33.1 32.3  MCHC 35.3 33.6 33.5 34.6 33.0  RDW 11.4* 11.9 11.9 11.7 11.9  LYMPHSABS  --  0.3*  --   --   --   MONOABS  --  0.6  --   --   --   EOSABS  --  0.0  --   --   --   BASOSABS  --  0.0  --   --   --     Chemistries  Recent Labs  Lab 07/24/19 0610 07/24/19 0610 07/25/19 0517 07/26/19 0615 07/27/19 0215 07/28/19 0247 07/29/19 0151  NA 142   < > 130* 129* 133* 136 136  K 4.6   < > 4.5 3.2* 3.8 3.3* 3.6  CL 103   < > 97* 93* 97* 101 101  CO2 24   < > GLUCOSE 126*   < > 118* 89 101* 82 81  BUN 17   < > CREATININE 0.77   < > 0.70 0.77 0.62 0.56* 0.56*  CALCIUM 8.5*   < > 7.6* 7.4* 7.5* 7.6* 7.8*  MG 1.5*  --  1.2*  --  2.1 2.0 1.8  AST 118*  --  72* 61* 49* 37  --   ALT 117*  --  65* 48* 44 39  --   ALKPHOS 82  --  50 43 52 60  --   BILITOT 1.1  --  1.0 1.1 1.0 0.8  --    < > = values in this interval not displayed.   ------------------------------------------------------------------------------------------------------------------ No results for input(s): CHOL, HDL, LDLCALC, TRIG, CHOLHDL, LDLDIRECT in the last 72 hours.  No results found for: HGBA1C ------------------------------------------------------------------------------------------------------------------ No results for input(s): TSH, T4TOTAL, T3FREE, THYROIDAB in the last 72 hours.  Invalid input(s): FREET3 ------------------------------------------------------------------------------------------------------------------ Recent Labs    07/28/19 0919  VITAMINB12 612  FOLATE 19.8  FERRITIN 900*  TIBC 192*  IRON 15*    Coagulation profile No results for input(s): INR, PROTIME in the last 168 hours.  No results for input(s): DDIMER in the last 72 hours.  Cardiac Enzymes No results for input(s): CKMB, TROPONINI, MYOGLOBIN in the last 168 hours.  Invalid  input(s): CK ------------------------------------------------------------------------------------------------------------------ No results found for: BNP  Micro Results Recent Results (from the past 240 hour(s))  SARS Coronavirus 2 by RT PCR (hospital order, performed in [facility] hospital lab) Nasopharyngeal Nasopharyngeal Swab     Status: None   Collection Time: 07/24/19 12:08  AM   Specimen: Nasopharyngeal Swab  Result Value Ref Range Status   SARS Coronavirus 2 NEGATIVE NEGATIVE Final    Comment: (NOTE) SARS-CoV-2 target nucleic acids are NOT DETECTED.  The SARS-CoV-2 RNA is generally detectable in upper and lower respiratory specimens during the acute phase of infection. The lowest concentration of SARS-CoV-2 viral copies this assay can detect is 250 copies / mL. A negative result does not preclude SARS-CoV-2 infection and should not be used as the sole basis for treatment or other patient management decisions.  A negative result may occur with improper specimen collection / handling, submission of specimen other than nasopharyngeal swab, presence of viral mutation(s) within the areas targeted by this assay, and inadequate number of viral copies (<250 copies / mL). A negative result must be combined with clinical observations, patient history, and epidemiological information.  Fact Sheet for Patients:   BoilerBrush.com.cy  Fact Sheet for Healthcare Providers: https://pope.com/  This test is not yet approved or  cleared by the [place] FDA and has been authorized for detection and/or diagnosis of SARS-CoV-2 by FDA under an Emergency Use Authorization (EUA).  This EUA will remain in effect (meaning this test can be used) for the duration of the COVID-19 declaration under Section 564(b)(1) of the Act, 21 U.S.C. section 360bbb-3(b)(1), unless the authorization is terminated or revoked sooner.  Performed at [facility], [address] [place], [place] [address]   Culture, blood (routine x 2)     Status: None (Preliminary result)   Collection Time: 07/26/19 11:20 AM   Specimen: BLOOD  Result Value Ref Range Status   Specimen Description   Final    BLOOD RIGHT ARM Performed at [facility], [address] [place], [place] [address]    Special Requests   Final    BOTTLES DRAWN AEROBIC AND ANAEROBIC Blood Culture adequate volume Performed at [facility], [address] [place], [place] [address]    Culture   Final    NO GROWTH 2 DAYS Performed at [facility], [address] [place], [place] [address]    Report Status PENDING  Incomplete  Culture, blood (routine x 2)     Status: None (Preliminary result)   Collection Time: 07/26/19 11:23 AM   Specimen: BLOOD  Result Value Ref Range Status   Specimen Description   Final    BLOOD BLOOD RIGHT HAND Performed at [facility], [address], [place], [place] [address]    Special Requests   Final    BOTTLES DRAWN AEROBIC ONLY Performed at [facility], [address] [place], [place] [address]    Culture   Final    NO GROWTH 2 DAYS Performed at [facility], [address], [place], [place] [address]    Report Status PENDING  Incomplete  MRSA PCR Screening     Status: None   Collection Time: 07/27/19  8:20 AM   Specimen: Nasal Mucosa; Nasopharyngeal  Result Value Ref Range Status   MRSA by PCR NEGATIVE NEGATIVE Final    Comment:        The GeneXpert MRSA Assay (FDA approved for NASAL specimens only), is one component of a comprehensive MRSA colonization surveillance program. It is not intended to diagnose MRSA infection nor to guide or monitor treatment for MRSA infections. Performed at [facility], [address] [place], [place] [address]     Radiology Reports CT  ABDOMEN PELVIS W CONTRAST  Result Date:  07/23/2019 CLINICAL DATA:  Nausea and vomiting. EXAM: CT ABDOMEN AND PELVIS WITH CONTRAST TECHNIQUE: Multidetector CT imaging of the abdomen and pelvis was performed using the standard protocol following bolus administration of intravenous contrast. CONTRAST:  OMNIPAQUE IOHEXOL 300 MG/ML  SOLN COMPARISON:  None. FINDINGS: Lower chest: The lung bases are clear. The heart size is normal. Hepatobiliary: There is decreased hepatic attenuation suggestive of hepatic steatosis. Normal gallbladder.There is no biliary ductal dilation. Pancreas: There is diffuse peripancreatic fat stranding and free fluid. Approximately 4.6 cm of the pancreatic body and tail is hypoenhancing. Additionally, there is a heterogeneous 2.6 cm area of the pancreatic head and uncinate process that is hypoenhancing. There is no well-formed drainable fluid collection. Spleen: Unremarkable. Adrenals/Urinary Tract: --Adrenal glands: Unremarkable. --Right kidney/ureter: No hydronephrosis or radiopaque kidney stones. --Left kidney/ureter: No hydronephrosis or radiopaque kidney stones. --Urinary bladder: Unremarkable. Stomach/Bowel: --Stomach/Duodenum: No hiatal hernia or other gastric abnormality. Normal duodenal course and caliber. --Small bowel: Unremarkable. --Colon: Unremarkable. --Appendix: Not visualized. No right lower quadrant inflammation or free fluid. Vascular/Lymphatic: Normal course and caliber of the major abdominal vessels. --No retroperitoneal lymphadenopathy. --No mesenteric lymphadenopathy. --No pelvic or inguinal lymphadenopathy. Reproductive: Unremarkable Other: There is free fluid in the abdomen and pelvis the abdominal wall is normal. Musculoskeletal. No acute displaced fractures. IMPRESSION: 1. Findings consistent with acute pancreatitis. There are hypoenhancing areas of the pancreatic tail and head concerning for developing areas of pancreatic necrosis. 2. Hepatic steatosis. 3. Small volume free fluid in the abdomen and  pelvis. Electronically Signed   By: Katherine Mantle M.D.   On: 07/23/2019 23:32   MR 3D Recon At Scanner  Result Date: 07/25/2019 CLINICAL DATA:  Inpatient. Severe acute pancreatitis with elevated liver function tests. EXAM: MRI ABDOMEN WITHOUT AND WITH CONTRAST (INCLUDING MRCP) TECHNIQUE: Multiplanar multisequence MR imaging of the abdomen was performed both before and after the administration of intravenous contrast. Heavily T2-weighted images of the biliary and pancreatic ducts were obtained, and three-dimensional MRCP images were rendered by post processing. CONTRAST:  61mL GADAVIST GADOBUTROL 1 MMOL/ML IV SOLN COMPARISON:  07/23/2019 CT abdomen/pelvis. FINDINGS: Lower chest: Small dependent bilateral pleural effusions, new. Patulous lower thoracic esophagus with air-fluid level suggesting esophageal dysmotility and/or gastroesophageal reflux. Hepatobiliary: Top-normal liver size. No definite liver surface irregularity. Prominent diffuse hepatic steatosis. No liver mass. Normal gallbladder with no cholelithiasis. No biliary ductal dilatation. Common bile duct diameter 2 mm. No choledocholithiasis. No biliary masses, strictures or beading. Pancreas: Diffusely thickened and edematous pancreatic parenchyma with marked diffuse peripancreatic fat stranding and ill-defined fluid extending into anterior and posterior paranephric retroperitoneal spaces bilaterally, compatible with acute pancreatitis. There are large regions of the pancreatic head and tail demonstrating patchy T1 hyperintensity and absence of enhancement, compatible with hemorrhagic necrotizing pancreatitis, involving approximately 50% of the pancreatic parenchyma. No pancreatic duct dilation. No discrete pancreatic mass. No measurable peripancreatic fluid collections. Spleen: Normal size. No mass. Adrenals/Urinary Tract: Normal adrenals. No hydronephrosis. Normal kidneys with no renal mass. Stomach/Bowel: Small hiatal hernia. Otherwise normal  nondistended stomach. Visualized small and large bowel is normal caliber, with no bowel wall thickening. Vascular/Lymphatic: Normal caliber abdominal aorta. Patent portal, splenic, hepatic and renal veins. No pathologically enlarged lymph nodes in the abdomen. Other: Small volume abdominal ascites.  No focal fluid collection. Musculoskeletal: No aggressive appearing focal osseous lesions. IMPRESSION: 1. Acute hemorrhagic necrotizing pancreatitis involving approximately 50% of the pancreatic parenchyma. No discrete measurable peripancreatic fluid collections. 2. No biliary ductal dilatation.  No cholelithiasis or choledocholithiasis. 3. Prominent diffuse hepatic steatosis. 4. Small dependent bilateral pleural effusions, new. Small volume abdominal ascites. 5. Patulous lower thoracic esophagus with air-fluid level suggesting esophageal dysmotility and/or gastroesophageal reflux. Electronically Signed   By: Delbert PhenixJason A Poff M.D.   On: 07/25/2019 07:54   DG Chest Port 1 View  Result Date: 07/29/2019 CLINICAL DATA:  Respiratory distress. EXAM: PORTABLE CHEST 1 VIEW COMPARISON:  Radiograph 07/23/2019. FINDINGS: 0929 hours. The heart size is at the upper limits of normal for portable AP technique. There are lower lung volumes with new vascular congestion and probable edema. There are small bilateral pleural effusions with left-greater-than-right basilar airspace opacities. No pneumothorax. The bones appear intact. Telemetry leads overlie the chest. IMPRESSION: Lower lung volumes with new vascular congestion, probable edema and small bilateral pleural effusions suggesting congestive heart failure. Bibasilar airspace opacities may reflect atelectasis or pneumonia. Electronically Signed   By: Carey BullocksWilliam  Veazey M.D.   On: 07/29/2019 09:52   DG Chest Portable 1 View  Result Date: 07/23/2019 CLINICAL DATA:  Vomiting, epigastric pain EXAM: PORTABLE CHEST 1 VIEW COMPARISON:  None. FINDINGS: Lungs are clear. No pneumothorax or  pleural effusion. Coronary artery stenting has been performed. Cardiac size within normal limits. The pulmonary vascularity is normal. No acute bone abnormality. IMPRESSION: No active disease. Electronically Signed   By: Helyn NumbersAshesh  Parikh MD   On: 07/23/2019 23:06   MR ABDOMEN MRCP W WO CONTAST  Result Date: 07/25/2019 CLINICAL DATA:  Inpatient. Severe acute pancreatitis with elevated liver function tests. EXAM: MRI ABDOMEN WITHOUT AND WITH CONTRAST (INCLUDING MRCP) TECHNIQUE: Multiplanar multisequence MR imaging of the abdomen was performed both before and after the administration of intravenous contrast. Heavily T2-weighted images of the biliary and pancreatic ducts were obtained, and three-dimensional MRCP images were rendered by post processing. CONTRAST:  8mL GADAVIST GADOBUTROL 1 MMOL/ML IV SOLN COMPARISON:  07/23/2019 CT abdomen/pelvis. FINDINGS: Lower chest: Small dependent bilateral pleural effusions, new. Patulous lower thoracic esophagus with air-fluid level suggesting esophageal dysmotility and/or gastroesophageal reflux. Hepatobiliary: Top-normal liver size. No definite liver surface irregularity. Prominent diffuse hepatic steatosis. No liver mass. Normal gallbladder with no cholelithiasis. No biliary ductal dilatation. Common bile duct diameter 2 mm. No choledocholithiasis. No biliary masses, strictures or beading. Pancreas: Diffusely thickened and edematous pancreatic parenchyma with marked diffuse peripancreatic fat stranding and ill-defined fluid extending into anterior and posterior paranephric retroperitoneal spaces bilaterally, compatible with acute pancreatitis. There are large regions of the pancreatic head and tail demonstrating patchy T1 hyperintensity and absence of enhancement, compatible with hemorrhagic necrotizing pancreatitis, involving approximately 50% of the pancreatic parenchyma. No pancreatic duct dilation. No discrete pancreatic mass. No measurable peripancreatic fluid  collections. Spleen: Normal size. No mass. Adrenals/Urinary Tract: Normal adrenals. No hydronephrosis. Normal kidneys with no renal mass. Stomach/Bowel: Small hiatal hernia. Otherwise normal nondistended stomach. Visualized small and large bowel is normal caliber, with no bowel wall thickening. Vascular/Lymphatic: Normal caliber abdominal aorta. Patent portal, splenic, hepatic and renal veins. No pathologically enlarged lymph nodes in the abdomen. Other: Small volume abdominal ascites.  No focal fluid collection. Musculoskeletal: No aggressive appearing focal osseous lesions. IMPRESSION: 1. Acute hemorrhagic necrotizing pancreatitis involving approximately 50% of the pancreatic parenchyma. No discrete measurable peripancreatic fluid collections. 2. No biliary ductal dilatation. No cholelithiasis or choledocholithiasis. 3. Prominent diffuse hepatic steatosis. 4. Small dependent bilateral pleural effusions, new. Small volume abdominal ascites. 5. Patulous lower thoracic esophagus with air-fluid level suggesting esophageal dysmotility and/or gastroesophageal reflux. Electronically Signed   By: Jannifer RodneyJason A Poff M.D.  On: 07/25/2019 07:54   US Abdomen Limited RUQ  Result Date: 07/25/2019 CLINICAL DATA:  Pancreatitis. EXAM: ULTRASOUND ABDOMEN LIMITED RIGHT UPPER QUADRANT COMPARISON:  Abdominal MRI 07/24/2019 FINDINGS: Gallbladder: No gallstones or wall thickening visualized. No sonographic Murphy sign noted by sonographer. Common bile duct: Diameter: 6 mm Liver: Diffusely increased parenchymal echogenicity without a focal lesion identified. Portal vein is patent on color Doppler imaging with normal direction of blood flow towards the liver. Other: Small volume perihepatic ascites and right pleural effusion. IMPRESSION: 1. Hepatic steatosis. 2. No gallstones or biliary dilatation. 3. Small volume perihepatic ascites and right pleural effusion. Electronically Signed   By: Sebastian Ache M.D.   On: 07/25/2019 09:22      Time Spent in minutes  30     Laverna Peace M.D on 07/29/2019 at 11:49 AM  To page go to [email or web address] - password [email or web address]

## 2019-07-29 NOTE — Progress Notes (Signed)
NAME:  Gary Atkinson, MRN:  [id number], DOB:  September 06, 1970, LOS: 5 ADMISSION DATE:  07/23/2019, CONSULTATION DATE:  07/27/2019 REFERRING MD:  Dennison Nancy MD, CHIEF COMPLAINT:   Acute pancreatitis, alcohol withdrawal, need for Precedex  Brief History   49 year old with alcohol abuse, acute pancreatitis.  Admitted on 7/21 PCCM consulted 7/25 for active DTs and need for Precedex.  Past Medical History    has a past medical history of Allergic rhinitis, Anxiety, Asthma, CAD (coronary artery disease), Depression, GERD (gastroesophageal reflux disease), Hyperlipemia, Hypertension, MI (myocardial infarction) (HCC), and Pneumonia.  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  Right upper quadrant ultrasound 07/25/2019-hepatic steatosis.  No gallstones or biliary dilatation.  Micro Data:  Blood culture 7/24>>> ngtd>>>  Antimicrobials:    Interim history/subjective:  More manageable overnight.  Oriented intermittently this am.  Weaning precedex  Objective   Blood pressure (!) 170/88, pulse 68, temperature (!) 96.8 F (36 C), temperature source Axillary, resp. rate (!) 24, height 5\' 11"  (1.803 m), weight 84.7 kg, SpO2 99 %.        Intake/Output Summary (Last 24 hours) at 07/29/2019 0857 Last data filed at 07/29/2019 0820 Gross per 24 hour  Intake 4667.96 ml  Output 1025 ml  Net 3642.96 ml   Filed Weights   07/23/19 2152 07/24/19 0552 07/26/19 1324  Weight: 72.6 kg 74.9 kg 84.7 kg    Examination: Gen:      No acute distress in bed on precedex gtt HEENT:  EOMI, sclera anicteric Neck:     No masses; no thyromegaly Lungs:    resps even, non labored on [place], mild exp wheeze  CV:         Regular rate and rhythm; no murmurs Abd:      Mild abd tenderness, hypoactive BS, soft  Ext:    No edema;  adequate peripheral perfusion Skin:      Warm and dry; no rash Neuro: RASS 0, oriented x 1-2, answers question   Resolved Hospital Problem list     Assessment & Plan:  EtOH abuse, alcohol withdrawal Continue CIWA protocol continue Valium 5 mg twice daily precedex gtt - continue attempts at weaning off   Acute pancreatitis secondary to EtOH Improving with downtrending lipase. Tolerating clear liquid diet Continue LR for fluid hydration GI following   Bronchospasm Tobacco abuse  PLAN -  Add q6h duoneb  Pulmonary hygiene  Smoking cessation  CXR today  May have some degree mild pulmonary edema. POS 8L.  Consider gentle diuresis   Best practice:  Per primary team  Labs   CBC: Recent Labs  Lab 07/23/19 2156 07/24/19 0610 07/25/19 0517 07/26/19 0615 07/29/19 0151  WBC 5.6 5.1 5.1 3.8* 5.9  NEUTROABS  --  4.2  --   --   --   HGB 13.8 14.8 12.8* 9.9* 9.8*  HCT 39.1 44.1 38.2* 28.6* 29.7*  MCV 93.3 97.1 97.7 95.7 98.0  PLT 146* 151 PLATELET CLUMPS NOTED ON SMEAR, UNABLE TO ESTIMATE 83* 149*    Basic Metabolic Panel: Recent Labs  Lab 07/24/19 0610 07/24/19 0610 07/25/19 0517 07/26/19 0615 07/27/19 0215 07/28/19 0247 07/29/19 0151  NA 142   < > 130* 129* 133* 136 136  K 4.6   < > 4.5 3.2* 3.8 3.3* 3.6  CL 103   < > 97* 93*  97* 101 101  CO2 24   < > 24 27 27 26 24   GLUCOSE 126*   < > 118* 89 101* 82 81  BUN 17   < > 16 10 7 9 12   CREATININE 0.77   < > 0.70 0.77 0.62 0.56* 0.56*  CALCIUM 8.5*   < > 7.6* 7.4* 7.5* 7.6* 7.8*  MG 1.5*  --  1.2*  --  2.1 2.0 1.8  PHOS  --   --  3.4  --  1.9* 3.8 3.9   < > = values in this interval not displayed.   GFR: Estimated Creatinine Clearance: 116.3 mL/min (A) (by C-G formula based on SCr of 0.56 mg/dL (L)). Recent Labs  Lab 07/24/19 0610 07/25/19 0517 07/26/19 0615 07/29/19 0151  WBC 5.1 5.1 3.8* 5.9    Liver Function Tests: Recent Labs  Lab 07/24/19 0610 07/25/19 0517 07/26/19 0615 07/27/19 0215  07/28/19 0247  AST 118* 72* 61* 49* 37  ALT 117* 65* 48* 44 39  ALKPHOS 82 50 43 52 60  BILITOT 1.1 1.0 1.1 1.0 0.8  PROT 7.0 5.6* 5.5* 5.5* 5.2*  ALBUMIN 3.8 2.8* 2.7* 2.6* 2.3*   Recent Labs  Lab 07/25/19 0517 07/26/19 0615 07/27/19 0215 07/28/19 0247 07/29/19 0151  LIPASE 623* 155* 56* 30 26   No results for input(s): AMMONIA in the last 168 hours.  ABG No results found for: PHART, PCO2ART, PO2ART, HCO3, TCO2, ACIDBASEDEF, O2SAT   Coagulation Profile: No results for input(s): INR, PROTIME in the last 168 hours.  Cardiac Enzymes: No results for input(s): CKTOTAL, CKMB, CKMBINDEX, TROPONINI in the last 168 hours.  HbA1C: No results found for: HGBA1C  CBG: No results for input(s): GLUCAP in the last 168 hours.   Critical care time:    The patient is critically ill with multiple organ system failure and requires high complexity decision making for assessment and support, frequent evaluation and titration of therapies, advanced monitoring, review of radiographic studies and interpretation of complex data.   Critical Care Time devoted to patient care services, exclusive of separately billable procedures, described in this note is 32 minutes.   07/30/19, NP Pulmonary/Critical Care Medicine  07/29/2019  8:57 AM

## 2019-07-30 ENCOUNTER — Encounter (HOSPITAL_COMMUNITY): Payer: Self-pay | Admitting: Family Medicine

## 2019-07-30 DIAGNOSIS — F322 Major depressive disorder, single episode, severe without psychotic features: Secondary | ICD-10-CM

## 2019-07-30 DIAGNOSIS — F411 Generalized anxiety disorder: Secondary | ICD-10-CM | POA: Diagnosis present

## 2019-07-30 LAB — CBC
HCT: 30.8 % — ABNORMAL LOW (ref 39.0–52.0)
Hemoglobin: 10.3 g/dL — ABNORMAL LOW (ref 13.0–17.0)
MCH: 32.3 pg (ref 26.0–34.0)
MCHC: 33.4 g/dL (ref 30.0–36.0)
MCV: 96.6 fL (ref 80.0–100.0)
Platelets: 214 10*3/uL (ref 150–400)
RBC: 3.19 MIL/uL — ABNORMAL LOW (ref 4.22–5.81)
RDW: 11.8 % (ref 11.5–15.5)
WBC: 11.3 10*3/uL — ABNORMAL HIGH (ref 4.0–10.5)
nRBC: 0 % (ref 0.0–0.2)

## 2019-07-30 LAB — LIPASE, BLOOD: Lipase: 27 U/L (ref 11–51)

## 2019-07-30 LAB — COMPREHENSIVE METABOLIC PANEL
ALT: 53 U/L — ABNORMAL HIGH (ref 0–44)
AST: 70 U/L — ABNORMAL HIGH (ref 15–41)
Albumin: 2.1 g/dL — ABNORMAL LOW (ref 3.5–5.0)
Alkaline Phosphatase: 92 U/L (ref 38–126)
Anion gap: 13 (ref 5–15)
BUN: 7 mg/dL (ref 6–20)
CO2: 27 mmol/L (ref 22–32)
Calcium: 7.7 mg/dL — ABNORMAL LOW (ref 8.9–10.3)
Chloride: 94 mmol/L — ABNORMAL LOW (ref 98–111)
Creatinine, Ser: 0.58 mg/dL — ABNORMAL LOW (ref 0.61–1.24)
GFR calc Af Amer: >60 mL/min (ref >60)
GFR calc non Af Amer: >60 mL/min (ref >60)
Glucose, Bld: 124 mg/dL — ABNORMAL HIGH (ref 70–99)
Potassium: 3.2 mmol/L — ABNORMAL LOW (ref 3.5–5.1)
Sodium: 134 mmol/L — ABNORMAL LOW (ref 135–145)
Total Bilirubin: 0.9 mg/dL (ref 0.3–1.2)
Total Protein: 4.9 g/dL — ABNORMAL LOW (ref 6.5–8.1)

## 2019-07-30 LAB — MAGNESIUM: Magnesium: 1.9 mg/dL (ref 1.7–2.4)

## 2019-07-30 LAB — PHOSPHORUS: Phosphorus: 4.5 mg/dL (ref 2.5–4.6)

## 2019-07-30 MED ORDER — HYDROMORPHONE HCL 1 MG/ML IJ SOLN
0.5000 mg | INTRAMUSCULAR | Status: DC | PRN
  Administered 2019-07-30 – 2019-08-04 (×13): 0.5 mg via INTRAVENOUS
  Filled 2019-07-30 (×6): qty 0.5
  Filled 2019-07-30: qty 1
  Filled 2019-07-30 (×6): qty 0.5

## 2019-07-30 MED ORDER — QUETIAPINE FUMARATE 25 MG PO TABS
50.0000 mg | ORAL_TABLET | Freq: Every day | ORAL | Status: DC
  Administered 2019-07-30 – 2019-08-05 (×7): 50 mg via ORAL
  Filled 2019-07-30 (×3): qty 2
  Filled 2019-07-30: qty 1
  Filled 2019-07-30 (×2): qty 2
  Filled 2019-07-30: qty 1

## 2019-07-30 MED ORDER — POTASSIUM CHLORIDE 20 MEQ PO PACK
40.0000 meq | PACK | Freq: Once | ORAL | Status: AC
  Administered 2019-07-30: 40 meq via ORAL
  Filled 2019-07-30: qty 2

## 2019-07-30 MED ORDER — IPRATROPIUM-ALBUTEROL 0.5-2.5 (3) MG/3ML IN SOLN
3.0000 mL | RESPIRATORY_TRACT | Status: DC | PRN
  Administered 2019-07-30: 3 mL via RESPIRATORY_TRACT
  Filled 2019-07-30: qty 3

## 2019-07-30 NOTE — Progress Notes (Signed)
NAME:  Gary Atkinson, MRN:  [id number], DOB:  September 12, 1970, LOS: 6 ADMISSION DATE:  07/23/2019, CONSULTATION DATE:  07/27/2019 REFERRING MD:  Dennison Nancy MD, CHIEF COMPLAINT:   Acute pancreatitis, alcohol withdrawal, need for Precedex  Brief History   49 year old with alcohol abuse, acute pancreatitis.  Admitted on 7/21 PCCM consulted 7/25 for active DTs and need for Precedex.  Past Medical History    has a past medical history of Allergic rhinitis, Anxiety, Asthma, CAD (coronary artery disease), Depression, GERD (gastroesophageal reflux disease), Hyperlipemia, Hypertension, MI (myocardial infarction) (HCC), and Pneumonia.  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 Right upper quadrant ultrasound 07/25/2019-hepatic steatosis.  No gallstones or biliary dilatation.  Micro Data:  COVID 7/22 >> negative  Blood culture 7/24 >>  Antimicrobials:    Interim history/subjective:  Off precedex since 1430 on 7/27 Tmax 101 / WBC 11.3  I/O 3.8L UOP, -3.4L in last 24 hours  Pt denies heavy alcohol use > states "if I drink 4-6 beers per week, that's a stretch"  Objective   Blood pressure (!) 160/93, pulse 79, temperature 98 F (36.7 C), temperature source Oral, resp. rate (!) 10, height 5\' 11"  (1.803 m), weight 84.7 kg, SpO2 99 %.        Intake/Output Summary (Last 24 hours) at 07/30/2019 0824 Last data filed at 07/30/2019 0444 Gross per 24 hour  Intake 120.39 ml  Output 3850 ml  Net -3729.61 ml   Filed Weights   07/23/19 2152 07/24/19 0552 07/26/19 1324  Weight: 72.6 kg 74.9 kg 84.7 kg    Examination: General: adult male lying in bed in NAD  HEENT: MM pink/moist, small sore on right side of tongue (appears as if he bit it previously), [place] O2 off with sats 95% on RA, anicteric Neuro: AAOx4,  speech clear, MAE, fine tremor noted of hands CV: s1s2 RRR, no m/r/g PULM: non-labored on RA, lungs bilaterally clear anterior GI: soft, bsx4 active, tolerating liquids / jello Extremities: warm/dry, no edema  Skin: no rashes or lesions  Resolved Hospital Problem list     Assessment & Plan:   EtOH abuse, alcohol withdrawal -continue CIWA protocol per primary  -continue valium, buspar  -ETOH abuse counseling when able  -PSY consulted  Acute pancreatitis secondary to EtOH Alcoholic Hepatitis  Improving with downtrending lipase. Concern for necrotizing pancreatitis, possible hemorrhagic component -continue liquid diet  -GI following  -follow lipase, LFT's, Hgb   Bronchospasm Tobacco abuse  -duoneb Q6 -pulmonary hygiene -IS, mobilize -smoking cessation counseling  -significant output with lasix   Hypokalemia  -KCL 40 mEq x1   Best practice:  Per primary team  PCCM will be available PRN.  Please call back if new needs arise.   Labs   CBC: Recent Labs  Lab 07/24/19 0610 07/25/19 0517 07/26/19 0615 07/29/19 0151 07/30/19 0211  WBC 5.1 5.1 3.8* 5.9 11.3*  NEUTROABS 4.2  --   --   --   --   HGB 14.8 12.8* 9.9* 9.8* 10.3*  HCT 44.1 38.2* 28.6* 29.7* 30.8*  MCV 97.1 97.7 95.7 98.0 96.6  PLT 151 PLATELET CLUMPS NOTED ON SMEAR, UNABLE TO ESTIMATE 83* 149* 214    Basic Metabolic Panel: Recent Labs  Lab 07/25/19 0517 07/25/19 0517 07/26/19 0615 07/27/19 0215 07/28/19 0247 07/29/19 0151 07/30/19 0211  NA 130*   < >  129* 133* 136 136 134*  K 4.5   < > 3.2* 3.8 3.3* 3.6 3.2*  CL 97*   < > 93* 97* 101 101 94*  CO2 24   < > 27 27 26 24 27   GLUCOSE 118*   < > 89 101* 82 81 124*  BUN 16   < > 10 7 9 12 7   CREATININE 0.70   < > 0.77 0.62 0.56* 0.56* 0.58*  CALCIUM 7.6*   < > 7.4* 7.5* 7.6* 7.8* 7.7*  MG 1.2*  --   --  2.1 2.0 1.8 1.9  PHOS 3.4  --   --  1.9* 3.8 3.9 4.5   < > = values in this interval not displayed.   GFR: Estimated Creatinine Clearance: 116.3  mL/min (A) (by C-G formula based on SCr of 0.58 mg/dL (L)). Recent Labs  Lab 07/25/19 0517 07/26/19 0615 07/29/19 0151 07/30/19 0211  WBC 5.1 3.8* 5.9 11.3*    Liver Function Tests: Recent Labs  Lab 07/25/19 0517 07/26/19 0615 07/27/19 0215 07/28/19 0247 07/30/19 0211  AST 72* 61* 49* 37 70*  ALT 65* 48* 44 39 53*  ALKPHOS 50 43 52 60 92  BILITOT 1.0 1.1 1.0 0.8 0.9  PROT 5.6* 5.5* 5.5* 5.2* 4.9*  ALBUMIN 2.8* 2.7* 2.6* 2.3* 2.1*   Recent Labs  Lab 07/26/19 0615 07/27/19 0215 07/28/19 0247 07/29/19 0151 07/30/19 0211  LIPASE 155* 56* 30 26 27    No results for input(s): AMMONIA in the last 168 hours.  ABG No results found for: PHART, PCO2ART, PO2ART, HCO3, TCO2, ACIDBASEDEF, O2SAT   Coagulation Profile: No results for input(s): INR, PROTIME in the last 168 hours.  Cardiac Enzymes: No results for input(s): CKTOTAL, CKMB, CKMBINDEX, TROPONINI in the last 168 hours.  HbA1C: No results found for: HGBA1C  CBG: No results for input(s): GLUCAP in the last 168 hours.   Critical care time: n/a    07/31/19, MSN, NP-C [facility] 07/30/2019, 8:24 AM   Please see [email or web address] for pager details.

## 2019-07-30 NOTE — Consult Note (Signed)
[facility] Face-to[facility]Face [facility] Consult   Reason for Consult:  Bipolar disorder Referring Physician:  Laverna PeaceNettey, Shayla D, MD Patient Identification: Gary Atkinson Dottavio MRN:  [id number] Principal Diagnosis: Major depressive disorder, single episode, severe without psychosis (HCC) Diagnosis:  Principal Problem:   Major depressive disorder, single episode, severe without psychosis (HCC) Active Problems:   Benign essential HTN   Acute pancreatitis   Intractable nausea and vomiting   Abdominal pain   GERD (gastroesophageal reflux disease)   Hiatal hernia   Elevated LFTs   Elevated troponin   Thrombocytopenia (HCC)   Delirium tremens (HCC)   Hypomagnesemia   Hypokalemia   Hypophosphatemia   Pleural effusion   GAD (generalized anxiety disorder)   Total Time spent with patient: 45 minutes  Subjective:  [facility] for: Hospitalist Brief Summary:   Bedelia Personodd Wamsleyis a 49 y.o.malewith medical history significant forGERD, HTN, alcohol use (reports being a social drinker/binge drinker) chronic elevated LFTswho presented on 7/21/2021with two days of nausea, multiple episodes of nonbilious/nonbloody vomiting, and abdominal pain and was found to have an elevated lipase of 1500, AST and ALT elevated with CT abdomen consistent with acute pancreatitis and concern for the development of necrosis.   Hospital course: Patient was given IV Protonix, IV fluids and IV morphine for pain control at ED before [facility] where he received LR infusion.  GI was consulted given patient's CT abdomen findings (followed by  GI, Dr. Adela LankArmbruster).  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    Patient was transferred to stepdown unit on 7/24 due to worsening alcohol withdrawal with active delirium tremens requiring  more frequently scheduled IV Ativan and started on precedex drip for better control of active delirium tremens on 7/25 with assistance from PCCM.   HPI:  Gary Atkinson Monteith, 49 y.o., male patient seen face to face by this provider, consulted with Dr. Lucianne MussKumar; and chart reviewed on 07/30/19.  On evaluation Gary Atkinson Landstrom reports he came to the hospital because he thought he had food poison but it was something different and "I guess you know all that by my chart."  Patient reports he is treated for his anxiety [facility] with psychiatry and therapy.  States that he is compliant with medications (Klonopin and Buspar)  Patient reports that he is not a daily drinker and that he only drinks on the week ends with friends "On a Saturday or Sunday when we get together to play golf we'll drink 3-4 beer then is home dinner and bed; but the last week or so I've drank fifth liquor per week."  Patient states he has also been experiencing  some depression.  He lives alone and has no family support "I do have one friend that I'm pretty close to but the rest are more associates."  Patient is currently unemployed related to being laid off; has been looking for work but hasn't found anything yet, which is also a stressor. During evaluation Gary Atkinson Jungbluth is alert/oriented x 4; calm/cooperative; and mood is congruent with affect.  He does not appear to be responding to internal/external stimuli or delusional thoughts.  Patient denies suicidal/self-harm/homicidal ideation, psychosis, and paranoia.  Patient answered question appropriately.  Discussed starting Seroquel that would help with anxiety, depression, and sleep.  Discussed efficacy and side effects.  Reported would also give information on medication.  Patient states that he is interested  in starting medication.       Past Psychiatric History: Anxiety, Depression  Risk to Self:  No Risk to Others:  No Prior Inpatient Therapy:  Yes Prior Outpatient Therapy:  Yes  Past  Medical History:  Past Medical History:  Diagnosis Date  . Allergic rhinitis   . Anxiety   . Asthma   . CAD (coronary artery disease)   . Depression   . GERD (gastroesophageal reflux disease)   . Hyperlipemia   . Hypertension   . MI (myocardial infarction) (HCC)   . Pneumonia     Past Surgical History:  Procedure Laterality Date  . APPENDECTOMY    . CARDIAC CATHETERIZATION     with 2 stents   Family History:  Family History  Problem Relation Age of Onset  . Liver disease Father   . Alcoholism Father    Family Psychiatric  History: See above Social History:  Social History   Substance and Sexual Activity  Alcohol Use Yes   Comment: social     Social History   Substance and Sexual Activity  Drug Use Never    Social History   Socioeconomic History  . Marital status: Single    Spouse name: Not on file  . Number of children: 0  . Years of education: Not on file  . Highest education level: Not on file  Occupational History  . Occupation: Airline pilot  Tobacco Use  . Smoking status: Former Smoker    Types: Cigarettes    Quit date: 2002    Years since quitting: 19.5  . Smokeless tobacco: Never Used  Substance and Sexual Activity  . Alcohol use: Yes    Comment: social  . Drug use: Never  . Sexual activity: Not on file  Other Topics Concern  . Not on file  Social History Narrative  . Not on file   Social Determinants of Health   Financial Resource Strain:   . Difficulty of Paying Living Expenses:   Food Insecurity:   . Worried About Programme researcher, broadcasting/film/video in the Last Year:   . Barista in the Last Year:   Transportation Needs:   . Freight forwarder (Medical):   Marland Kitchen Lack of Transportation (Non-Medical):   Physical Activity:   . Days of Exercise per Week:   . Minutes of Exercise per Session:   Stress:   . Feeling of Stress :   Social Connections:   . Frequency of Communication with Friends and Family:   . Frequency of Social Gatherings with  Friends and Family:   . Attends Religious Services:   . Active Member of Clubs or Organizations:   . Attends Banker Meetings:   Marland Kitchen Marital Status:    Additional Social History:    Allergies:  No Known Allergies  Labs:  Results for orders placed or performed during the hospital encounter of 07/23/19 (from the past 48 hour(s))  Lipase, blood     Status: None   Collection Time: 07/29/19  1:51 AM  Result Value Ref Range   Lipase 26 11 - 51 U/L    Comment: Performed at [facility], [address] [place], [place] [address]  Magnesium     Status: None   Collection Time: 07/29/19  1:51 AM  Result Value Ref Range   Magnesium 1.8 1.7 - 2.4 mg/dL    Comment: Performed at [facility], [address], [place], [place] [address]  Phosphorus  Status: None   Collection Time: 07/29/19  1:51 AM  Result Value Ref Range   Phosphorus 3.9 2.5 - 4.6 mg/dL    Comment: Performed at [facility], [address] [place], [place] [address]  CBC     Status: Abnormal   Collection Time: 07/29/19  1:51 AM  Result Value Ref Range   WBC 5.9 4.0 - 10.5 K/uL   RBC 3.03 (L) 4.22 - 5.81 MIL/uL   Hemoglobin 9.8 (L) 13.0 - 17.0 g/dL   HCT 61.4 (L) 39 - 52 %   MCV 98.0 80.0 - 100.0 fL   MCH 32.3 26.0 - 34.0 pg   MCHC 33.0 30.0 - 36.0 g/dL   RDW 43.1 54.0 - 08.6 %   Platelets 149 (L) 150 - 400 K/uL   nRBC 0.0 0.0 - 0.2 %    Comment: Performed at [facility], [address] [place], [place] [address]  Basic metabolic panel     Status: Abnormal   Collection Time: 07/29/19  1:51 AM  Result Value Ref Range   Sodium 136 135 - 145 mmol/L   Potassium 3.6 3.5 - 5.1 mmol/L   Chloride 101 98 - 111 mmol/L   CO2 24 22 - 32 mmol/L   Glucose, Bld 81 70 - 99 mg/dL    Comment: Glucose reference range applies only to samples taken after fasting for at least 8 hours.   BUN 12 6 - 20 mg/dL   Creatinine, Ser 0.93 (L) 0.61 - 1.24  mg/dL   Calcium 7.8 (L) 8.9 - 10.3 mg/dL   GFR calc non Af Amer >60 >60 mL/min   GFR calc Af Amer >60 >60 mL/min   Anion gap 11 5 - 15    Comment: Performed at [facility], [address], [place], [place] [address]  Lipase, blood     Status: None   Collection Time: 07/30/19  2:11 AM  Result Value Ref Range   Lipase 27 11 - 51 U/L    Comment: Performed at [facility], [address] [place], [place] [address]  Magnesium     Status: None   Collection Time: 07/30/19  2:11 AM  Result Value Ref Range   Magnesium 1.9 1.7 - 2.4 mg/dL    Comment: Performed at [facility], [address] [place], [place] [address]  Phosphorus     Status: None   Collection Time: 07/30/19  2:11 AM  Result Value Ref Range   Phosphorus 4.5 2.5 - 4.6 mg/dL    Comment: Performed at [facility], [address], [place], [place] [address]  CBC     Status: Abnormal   Collection Time: 07/30/19  2:11 AM  Result Value Ref Range   WBC 11.3 (H) 4.0 - 10.5 K/uL   RBC 3.19 (L) 4.22 - 5.81 MIL/uL   Hemoglobin 10.3 (L) 13.0 - 17.0 g/dL   HCT 97.6 (L) 39 - 52 %   MCV 96.6 80.0 - 100.0 fL   MCH 32.3 26.0 - 34.0 pg   MCHC 33.4 30.0 - 36.0 g/dL   RDW 73.4 19.3 - 79.0 %   Platelets 214 150 - 400 K/uL   nRBC 0.0 0.0 - 0.2 %    Comment: Performed at [facility], [address] [place], [place] [address]  Comprehensive metabolic panel     Status: Abnormal   Collection Time: 07/30/19  2:11 AM  Result Value Ref  Range   Sodium 134 (L) 135 - 145 mmol/L   Potassium 3.2 (L) 3.5 - 5.1 mmol/L   Chloride 94 (L) 98 - 111 mmol/L   CO2 27 22 - 32 mmol/L   Glucose, Bld 124 (H) 70 - 99 mg/dL    Comment: Glucose reference range applies only to samples taken after fasting for at least 8 hours.   BUN 7 6 - 20 mg/dL   Creatinine, Ser 0.45 (L) 0.61 - 1.24 mg/dL   Calcium 7.7 (L) 8.9 - 10.3 mg/dL   Total Protein 4.9 (L) 6.5 - 8.1 g/dL    Albumin 2.1 (L) 3.5 - 5.0 g/dL   AST 70 (H) 15 - 41 U/L   ALT 53 (H) 0 - 44 U/L   Alkaline Phosphatase 92 38 - 126 U/L   Total Bilirubin 0.9 0.3 - 1.2 mg/dL   GFR calc non Af Amer >60 >60 mL/min   GFR calc Af Amer >60 >60 mL/min   Anion gap 13 5 - 15    Comment: Performed at [facility], [address] [place], [place] [address]    Current Facility-Administered Medications  Medication Dose Route Frequency Provider Last Rate Last Admin  . acetaminophen (TYLENOL) tablet 650 mg  650 mg Oral Q6H PRN Roberto Scales D, MD   650 mg at 07/29/19 2019   Or  . acetaminophen (TYLENOL) suppository 650 mg  650 mg Rectal Q6H PRN Roberto Scales D, MD   650 mg at 07/26/19 0945  . busPIRone (BUSPAR) tablet 20 mg  20 mg Oral BID Roberto Scales D, MD   20 mg at 07/30/19 1011  . Chlorhexidine Gluconate Cloth 2 % PADS 6 each  6 each Topical Daily Laverna Peace, MD   6 each at 07/29/19 1000  . diazepam (VALIUM) tablet 5 mg  5 mg Oral BID Mannam, Praveen, MD   5 mg at 07/30/19 1012  . feeding supplement (BOOST / RESOURCE BREEZE) liquid 1 Container  1 Container Oral TID BM Esterwood, Amy S, PA-C   1 Container at 07/30/19 1017  . fluvoxaMINE (LUVOX) tablet 150 mg  150 mg Oral BID Roberto Scales D, MD   150 mg at 07/30/19 1012  . folic acid (FOLVITE) tablet 1 mg  1 mg Oral Daily Roberto Scales D, MD   1 mg at 07/30/19 1012  . hydrALAZINE (APRESOLINE) injection 5-10 mg  5-10 mg Intravenous Q6H PRN Roberto Scales D, MD      . HYDROmorphone (DILAUDID) injection 0.5 mg  0.5 mg Intravenous Q4H PRN Kathlen Mody, MD      . ipratropium-albuterol (DUONEB) 0.5-2.5 (3) MG/3ML nebulizer solution 3 mL  3 mL Nebulization Q4H PRN Kathlen Mody, MD      . lip balm (CARMEX) ointment   Topical PRN Laverna Peace, MD   Given at 07/28/19 1639  . lisinopril (ZESTRIL) tablet 10 mg  10 mg Oral Daily Roberto Scales D, MD   10 mg at 07/30/19 1012  . LORazepam (ATIVAN) injection 0-4 mg  0-4 mg Intravenous Q8H  Roberto Scales D, MD      . MEDLINE mouth rinse  15 mL Mouth Rinse BID Roberto Scales D, MD   15 mL at 07/30/19 1013  . multivitamin with minerals tablet 1 tablet  1 tablet Oral Daily Roberto Scales D, MD   1 tablet at 07/30/19 1012  . ondansetron (ZOFRAN) tablet 4 mg  4 mg Oral Q6H PRN Laverna Peace, MD  Or  . ondansetron (ZOFRAN) injection 4 mg  4 mg Intravenous Q6H PRN Roberto Scales D, MD      . oxyCODONE (Oxy IR/ROXICODONE) immediate release tablet 5-10 mg  5-10 mg Oral Q4H PRN Roberto Scales D, MD   10 mg at 07/30/19 0448  . pantoprazole (PROTONIX) injection 40 mg  40 mg Intravenous Q24H Roberto Scales D, MD   40 mg at 07/29/19 2129  . propranolol (INDERAL) tablet 20 mg  20 mg Oral Daily Roberto Scales D, MD   20 mg at 07/30/19 1012  . thiamine tablet 100 mg  100 mg Oral Daily Roberto Scales D, MD   100 mg at 07/30/19 1012   Or  . thiamine (B-1) injection 100 mg  100 mg Intravenous Daily Roberto Scales D, MD   100 mg at 07/26/19 2671    Musculoskeletal: Strength & Muscle Tone: within normal limits Gait & Station: normal Patient leans: N/A  Psychiatric Specialty Exam: Physical Exam Vitals and nursing note reviewed. Exam conducted with a chaperone present.  Pulmonary:     Effort: Pulmonary effort is normal.  Musculoskeletal:        General: Normal range of motion.  Skin:    General: Skin is warm and dry.  Neurological:     Mental Status: He is alert.  Psychiatric:        Attention and Perception: Attention and perception normal.        Mood and Affect: Affect normal. Mood is anxious.        Speech: Speech normal.        Behavior: Behavior normal. Behavior is cooperative.        Thought Content: Thought content normal. Thought content is not paranoid or delusional. Thought content does not include homicidal or suicidal ideation.        Cognition and Memory: Cognition and memory normal.        Judgment: Judgment normal.     Review of Systems  Psychiatric/Behavioral:  Negative for agitation, behavioral problems, confusion, decreased concentration, hallucinations, self-injury, sleep disturbance and suicidal ideas. Nervous/anxious: Rating 7/10 (0/none and 10/worse)        Patient stating that anxiety is managed by outpatient provider at [facility]) with Buspar that he takes twice daily and klonopin as needed.  Reporting depression 7/10 which has been gradually worsening.      Blood pressure (!) 166/78, pulse 92, temperature 99.2 F (37.3 C), temperature source Oral, resp. rate 15, height 5\' 11"  (1.803 m), weight 84.7 kg, SpO2 93 %.Body mass index is 26.04 kg/m.  General Appearance: Casual  Eye Contact:  Good  Speech:  Clear and Coherent and Normal Rate  Volume:  Normal  Mood:  Anxious and Depressed  Affect:  Appropriate and Congruent  Thought Process:  Coherent, Goal Directed and Descriptions of Associations: Intact  Orientation:  Full (Time, Place, and Person)  Thought Content:  WDL  Suicidal Thoughts:  No  Homicidal Thoughts:  No  Memory:  Immediate;   Good Recent;   Good  Judgement:  Intact  Insight:  Present  Psychomotor Activity:  Normal  Concentration:  Concentration: Good and Attention Span: Good  Recall:  Good  Fund of Knowledge:  Good  Language:  Good  Akathisia:  No  Handed:  Right  AIMS (if indicated):     Assets:  Communication Skills Desire for Improvement Housing Social Support  ADL's:  Intact  Cognition:  WNL  Sleep:      Treatment Plan Summary: Plan  Psychiatrically clear Start Seroquel 50 mg Q hs.  Patient can follow up with current outpatient psychiatric provider  Would also continue Ativan withdrawal/detol protocol  Disposition:  Psychiatrically cleared No evidence of imminent risk to self or others at present.   Patient does not meet criteria for psychiatric inpatient admission. Supportive therapy provided about ongoing stressors. Discussed crisis plan, support from social network, calling 911, coming to the Emergency  Department, and calling Suicide Hotline.   Message sent to Kathlen Mody, MD informing:  Patient has been seen by psychiatry and psychiatrically cleared.  Started Seroquel 50 mg Q hs to help with depression, anxiety, and sleep.  He can follow up with current outpatient psychiatric provider for management of Seroquel once discharged.    Meli Faley, NP 07/30/2019 12:38 PM

## 2019-07-30 NOTE — Evaluation (Signed)
Physical Therapy Evaluation Patient Details Name: Gary Atkinson MRN: [id number] DOB: 01/13/1970 Today's Date: 07/30/2019   History of Present Illness  Patient is a 49 y.o. male with medical history significant for GERD, HTN, alcohol use (reports being a social drinker/binge drinker) chronic elevated LFTs who presented on 07/23/2019 with two days of nausea, multiple episodes of nonbilious/nonbloody vomiting, and abdominal pain and was found to have an elevated lipase of 1500, AST and ALT elevated with CT abdomen consistent with acute pancreatitis and concern for the development of necrosis. Admitted on 07/23/19. PMH significant for Allergic rhinitis, Anxiety, Asthma, CAD, Depression, GERD, Hyperlipemia, Hypertension, MI and Pneumonia.    Clinical Impression  Gary Atkinson is 49 y.o. male admitted with above HPI and diagnosis. Patient is currently limited by functional impairments below (see PT problem list). Patient lives alone and is independent at baseline. Patient does not have any family/friends available to help him at home and he has 4 flights of stairs to negotiate to return to his 3rd floor apartment. He currently requires min assist with bed mob and giat using IV pole and HHA to steady. Patient will benefit from continued skilled PT interventions to address impairments and progress independence with mobility, recommending SNF level follow up. Acute PT will follow and progress as able.     Follow Up Recommendations SNF;Supervision for mobility/OOB    Equipment Recommendations  Rolling walker with 5" wheels (defer to facility)    Recommendations for Other Services       Precautions / Restrictions Precautions Precautions: Fall Restrictions Weight Bearing Restrictions: No      Mobility  Bed Mobility Overal bed mobility: Needs Assistance Bed Mobility: Supine to Sit;Sit to Supine     Supine to sit: Min assist;HOB elevated Sit to supine: Min assist   General bed mobility comments:  cues to use bed rail and assist to bring LE's off EOB fully and raise trunk upright. Assist   Transfers Overall transfer level: Needs assistance Equipment used: 1 person hand held assist Transfers: Sit to/from Stand Sit to Stand: Min assist         General transfer comment: Min assist to steady with rise, pt initaited power up with single UE use. 1x from EOB and 1x from commode with use of grab bar to rise.   Ambulation/Gait Ambulation/Gait assistance: Min assist Gait Distance (Feet): 40 Feet (2x20) Assistive device: IV Pole Gait Pattern/deviations: Step-through pattern;Decreased step length - right;Decreased step length - left;Decreased stride length;Shuffle;Narrow base of support Gait velocity: decr   General Gait Details: VCs to increase step width and min assist throughout to prevent LOB as pt unsteady.   Stairs            Wheelchair Mobility    Modified Rankin (Stroke Patients Only)       Balance Overall balance assessment: Needs assistance Sitting-balance support: Feet supported Sitting balance-Leahy Scale: Good     Standing balance support: During functional activity;Bilateral upper extremity supported;Single extremity supported Standing balance-Leahy Scale: Fair                  Pertinent Vitals/Pain Pain Assessment: 0-10 Pain Score: 8  Pain Location: abdomen Pain Descriptors / Indicators: Aching;Discomfort Pain Intervention(s): Monitored during session;Repositioned    Home Living Family/patient expects to be discharged to:: Private residence Living Arrangements: Alone Available Help at Discharge: Family Type of Home: Apartment (3rd floor apt. no elevator) Home Access: Stairs to enter Entrance Stairs-Rails: Can reach both Entrance Stairs-Number of Steps: 4 flights of stairs  Home Layout: One level Home Equipment: None Additional Comments: Family reports he does not have any support from friends/family if he discharges home.     Prior  Function Level of Independence: Independent         Comments: pt reports he was independent PTA.      Hand Dominance   Dominant Hand: Right    Extremity/Trunk Assessment   Upper Extremity Assessment Upper Extremity Assessment: Overall WFL for tasks assessed (pt with tremors)    Lower Extremity Assessment Lower Extremity Assessment: RLE deficits/detail;LLE deficits/detail;Overall WFL for tasks assessed RLE Deficits / Details: strength is functional, pt with impaired coordination and tremors RLE Coordination: decreased gross motor LLE Deficits / Details: strength is functional, pt with impaired coordination and tremors LLE Coordination: decreased gross motor    Cervical / Trunk Assessment Cervical / Trunk Assessment: Normal  Communication   Communication: No difficulties  Cognition Arousal/Alertness: Awake/alert Behavior During Therapy: WFL for tasks assessed/performed Overall Cognitive Status: Impaired/Different from baseline Area of Impairment: Orientation                 Orientation Level: Disoriented to;Place             General Comments: pt family present but did not elaborate on pt's cognitive baseline. pt using clues in room to answer questions (reading the board to name the building when asked if he knew where he was) unable to get date.      General Comments      Exercises     Assessment/Plan    PT Assessment Patient needs continued PT services  PT Problem List Decreased activity tolerance;Decreased balance;Decreased mobility;Decreased coordination;Decreased knowledge of use of DME;Decreased safety awareness       PT Treatment Interventions DME instruction;Gait training;Stair training;Functional mobility training;Therapeutic activities;Balance training;Therapeutic exercise;Patient/family education    PT Goals (Current goals can be found in the Care Plan section)  Acute Rehab PT Goals Patient Stated Goal: regain independence PT Goal  Formulation: With patient Time For Goal Achievement: 08/13/19 Potential to Achieve Goals: Good    Frequency Min 3X/week   Barriers to discharge Decreased caregiver support;Inaccessible home environment pt has 4 flights of stair to go up to enter home. his 2 sisters report they are unable to assist him at home and no otehr friends or family are available.    Co-evaluation               AM-PAC PT "6 Clicks" Mobility  Outcome Measure Help needed turning from your back to your side while in a flat bed without using bedrails?: A Little Help needed moving from lying on your back to sitting on the side of a flat bed without using bedrails?: A Little Help needed moving to and from a bed to a chair (including a wheelchair)?: A Little Help needed standing up from a chair using your arms (e.g., wheelchair or bedside chair)?: A Little Help needed to walk in hospital room?: A Little Help needed climbing 3-5 steps with a railing? : A Lot 6 Click Score: 17    End of Session Equipment Utilized During Treatment: Gait belt Activity Tolerance: Patient tolerated treatment well Patient left: in bed;with call bell/phone within reach;with bed alarm set;with family/visitor present;with SCD's reapplied Nurse Communication: Mobility status PT Visit Diagnosis: Muscle weakness (generalized) (M62.81);Unsteadiness on feet (R26.81);Other abnormalities of gait and mobility (R26.89);Difficulty in walking, not elsewhere classified (R26.2)    Time: 3976-7341 PT Time Calculation (min) (ACUTE ONLY): 41 min   Charges:  PT Evaluation $PT Eval Moderate Complexity: 1 Mod PT Treatments $Gait Training: 8-22 mins $Therapeutic Activity: 8-22 mins    Wynn Maudlin, DPT Acute Rehabilitation Services  Office [phone] Pager [phone]  07/30/2019 3:26 PM

## 2019-07-30 NOTE — Progress Notes (Signed)
PROGRESS NOTE    Gary Atkinson  SHF:[id number] DOB: 1970-05-29 DOA: 07/23/2019 PCP: Patient, No Pcp Per    No chief complaint on file.   Brief Narrative:  49 year old gentleman with prior history of hypertension, alcohol use, GERD, chronically elevated liveR enzymes presents to ED for nausea vomiting and abdominal pain.  On arrival to ED was found to have a lipase of 1500, elevated AST and ALT.  CT of the abdomen was consistent with acute pancreatitis and concern for development of necrosis.  Patient was admitted by [facility] and GI was consulted.  He underwent MRCP showing acute hemorrhagic necrotizing pancreatitis involving 50% of pancreatic parenchyma with no biliary duct dilatation and no choledocholithiasis but with diffuse hepatic steatosis.  Patient was transferred to progressive unit on 7/24 due to worsening alcohol withdrawals with active delirium tremens requiring IV Precedex drip for better control.  PCCM on board and assisting with withdrawal symptoms.  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 Lipase has improved.  Abdomen is soft but tender , nausea has improved no vomiting. GI consulted and recommended repeat imaging by the end of the week. Pain control, continue with symptomatic management with IV fluids IV antiemetics and pain control.    Alcohol abuse with alcohol withdrawal symptoms and delirium tremens. He was started on Precedex drip on 7/25 due to worsening  DTs was able to wean him off last night. PCCM on board and appreciate their assistance. Continue with CIWA protocol with Ativan, thiamine, multivitamin, folic acid.   Wheezing Appears to have resolved with IV Lasix.  Nasal cannula oxygen as needed.  Continue with duo nebs as needed.    Hypomagnesemia hypokalemia hypophosphatemia and hypocalcemia Replaced   Fever T-max of 101. Blood cultures have been negative so far. Patient will probably need a repeat CT for further evaluation.    Pancytopenia Probably secondary to bone marrow suppression from alcohol abuse.    Hyponatremia probably from fluid losses reactionary to acute pancreatitis superimposed on chronic alcohol abuse.    Transaminitis Probably secondary to hepatic steatosis and /or  alcoholic hepatitis.   Essential hypertension Well-controlled.   Substance abuse disorder Psychiatric consulted and recommended starting the patient on Seroquel 50 mg at bedtime   Elevated troponins probably secondary to demand ischemia.  / Coronary artery disease s/p DES Currently off aspirin due to hemorrhagic pancreatic necrosis.    Acute thrombocytopenia  Much improved platelets this am.    DVT prophylaxis: SCD'S Code Status: full code.  Family Communication: none at bedside.  Disposition:   Status is: Inpatient  Remains inpatient appropriate because:IV treatments appropriate due to intensity of illness or inability to take PO   Dispo: The patient is from: Home              Anticipated d/c is to: PENDING              Anticipated d/c date is: > 3 days  Patient currently is not medically stable to d/c.       Consultants:   PSYCHIATRY  GI  PCCM  Procedures:  NONE  Antimicrobials:  Subjective: Requesting IV dilaudid for pain.  No chest pain or sob.  Persistent abd pain.  No nausea, or vomiting.  Alert and oriented to place and person.   Objective: Vitals:   07/30/19 1000  07/30/19 1100 07/30/19 1200 07/30/19 1300  BP: (!) 174/90 (!) 166/78 (!) 168/85 (!) 153/90  Pulse: 94 92 85 79  Resp: 18 15 (!) 27 14  Temp:   (!) 100.7 F (38.2 C)   TempSrc:   Oral   SpO2: 95% 93% 97% 90%  Weight:      Height:        Intake/Output Summary (Last 24 hours) at 07/30/2019 1417 Last data filed at 07/30/2019 0444 Gross per 24 hour  Intake 120.39 ml  Output 3850 ml  Net -3729.61 ml   Filed Weights   07/23/19 2152 07/24/19 0552 07/26/19 1324  Weight: 72.6 kg 74.9 kg 84.7 kg    Examination:  General exam: Appears calm and comfortable , not in distress.  Respiratory system: Clear to auscultation. Respiratory effort normal. Cardiovascular system: S1 & S2 heard, RRR. Marland Kitchen No pedal edema. Gastrointestinal system: Abdomen is soft  , tender generalized, bowel sounds wnl.  Central nervous system: Alert and oriented. No focal neurological deficits. Extremities: Symmetric 5 x 5 power. Skin: No rashes, lesions or ulcers Psychiatry:  Mood & affect appropriate.     Data Reviewed: I have personally reviewed following labs and imaging studies  CBC: Recent Labs  Lab 07/24/19 0610 07/25/19 0517 07/26/19 0615 07/29/19 0151 07/30/19 0211  WBC 5.1 5.1 3.8* 5.9 11.3*  NEUTROABS 4.2  --   --   --   --   HGB 14.8 12.8* 9.9* 9.8* 10.3*  HCT 44.1 38.2* 28.6* 29.7* 30.8*  MCV 97.1 97.7 95.7 98.0 96.6  PLT 151 PLATELET CLUMPS NOTED ON SMEAR, UNABLE TO ESTIMATE 83* 149* 214    Basic Metabolic Panel: Recent Labs  Lab 07/25/19 0517 07/25/19 0517 07/26/19 0615 07/27/19 0215 07/28/19 0247 07/29/19 0151 07/30/19 0211  NA 130*   < > 129* 133* 136 136 134*  K 4.5   < > 3.2* 3.8 3.3* 3.6 3.2*  CL 97*   < > 93* 97* 101 101 94*  CO2 24   < > 27 27 26 24 27   GLUCOSE 118*   < > 89 101* 82 81 124*  BUN 16   < > 10 7 9 12 7   CREATININE 0.70   < > 0.77 0.62 0.56* 0.56* 0.58*  CALCIUM 7.6*   < > 7.4* 7.5* 7.6* 7.8* 7.7*  MG 1.2*  --   --  2.1 2.0 1.8 1.9  PHOS 3.4  --   --  1.9*  3.8 3.9 4.5   < > = values in this interval not displayed.    GFR: Estimated Creatinine Clearance: 116.3 mL/min (A) (by C-G formula based on SCr of 0.58 mg/dL (L)).  Liver Function Tests: Recent Labs  Lab 07/25/19 0517 07/26/19 0615 07/27/19 0215 07/28/19 0247 07/30/19 0211  AST 72* 61* 49* 37 70*  ALT 65* 48* 44 39 53*  ALKPHOS 50 43 52 60 92  BILITOT 1.0 1.1 1.0 0.8 0.9  PROT 5.6* 5.5* 5.5* 5.2* 4.9*  ALBUMIN 2.8* 2.7* 2.6* 2.3* 2.1*    CBG: No results for input(s): GLUCAP in the last 168 hours.  Recent Results (from the past 240 hour(s))  SARS Coronavirus 2 by RT PCR (hospital order, performed in [facility] hospital lab) Nasopharyngeal Nasopharyngeal Swab     Status: None   Collection Time: 07/24/19 12:08 AM   Specimen: Nasopharyngeal Swab  Result Value Ref Range Status   SARS Coronavirus 2 NEGATIVE NEGATIVE Final    Comment: (NOTE) SARS-CoV-2 target nucleic acids are NOT DETECTED.  The SARS-CoV-2 RNA is generally detectable in upper and lower respiratory specimens during the acute phase of infection. The lowest concentration of SARS-CoV-2 viral copies this assay can detect is 250 copies / mL. A negative result does not preclude SARS-CoV-2 infection and should not be used as the sole basis for treatment or other patient management decisions.  A negative result may occur with improper specimen collection / handling, submission of specimen other than nasopharyngeal swab, presence of viral mutation(s) within the areas targeted by this assay, and inadequate number of viral copies (<250 copies / mL). A negative result must be combined with clinical observations, patient history, and epidemiological information.  Fact Sheet for Patients:   BoilerBrush.com.cyhttps://www.fda.gov/media/136312/download  Fact Sheet for Healthcare Providers: https://pope.com/https://www.fda.gov/media/136313/download  This test is not yet approved or  cleared by the [place] FDA and has been authorized for detection  and/or diagnosis of SARS-CoV-2 by FDA under an Emergency Use Authorization (EUA).  This EUA will remain in effect (meaning this test can be used) for the duration of the COVID-19 declaration under Section 564(b)(1) of the Act, 21 U.S.C. section 360bbb-3(b)(1), unless the authorization is terminated or revoked sooner.  Performed at [facility], [address] [place], [place] [address]   Culture, blood (routine x 2)     Status: None (Preliminary result)   Collection Time: 07/26/19 11:20 AM   Specimen: BLOOD  Result Value Ref Range Status   Specimen Description   Final    BLOOD RIGHT ARM Performed at [facility], [address] [place], [place] [address]    Special Requests   Final    BOTTLES DRAWN AEROBIC AND ANAEROBIC Blood Culture adequate volume Performed at [facility], [address] [place], [place] [address]    Culture   Final    NO GROWTH 3 DAYS Performed at [facility], [address] [place], [place] [address]    Report Status PENDING  Incomplete  Culture, blood (routine x 2)     Status: None (Preliminary result)   Collection Time: 07/26/19 11:23 AM   Specimen: BLOOD  Result Value Ref Range Status   Specimen Description   Final    BLOOD BLOOD RIGHT HAND Performed at [facility], [address] [place], [place] [address]    Special Requests   Final    BOTTLES DRAWN AEROBIC ONLY Performed at [facility], [address] [place], [place] [address]    Culture   Final    NO GROWTH 3 DAYS Performed at [facility], [address] [place], [place] [address]    Report Status PENDING  Incomplete  MRSA PCR Screening     Status: None   Collection Time: 07/27/19  8:20 AM   Specimen: Nasal Mucosa; Nasopharyngeal  Result Value Ref Range Status   MRSA by PCR NEGATIVE NEGATIVE Final    Comment:        The GeneXpert MRSA Assay (FDA approved for NASAL  specimens only), is one component of a comprehensive MRSA colonization surveillance  program. It is not intended to diagnose MRSA infection nor to guide or monitor treatment for MRSA infections. Performed at [facility], [address] [place], [place] [address]          Radiology Studies: [facility] Chest Port 1 View  Result Date: 07/29/2019 CLINICAL DATA:  Respiratory distress. EXAM: PORTABLE CHEST 1 VIEW COMPARISON:  Radiograph 07/23/2019. FINDINGS: 0929 hours. The heart size is at the upper limits of normal for portable AP technique. There are lower lung volumes with new vascular congestion and probable edema. There are small bilateral pleural effusions with left-greater-than-right basilar airspace opacities. No pneumothorax. The bones appear intact. Telemetry leads overlie the chest. IMPRESSION: Lower lung volumes with new vascular congestion, probable edema and small bilateral pleural effusions suggesting congestive heart failure. Bibasilar airspace opacities may reflect atelectasis or pneumonia. Electronically Signed   By: Carey Bullocks M.D.   On: 07/29/2019 09:52        Scheduled Meds: . busPIRone  20 mg Oral BID  . Chlorhexidine Gluconate Cloth  6 each Topical Daily  . diazepam  5 mg Oral BID  . feeding supplement  1 Container Oral TID BM  . fluvoxaMINE  150 mg Oral BID  . folic acid  1 mg Oral Daily  . lisinopril  10 mg Oral Daily  . mouth rinse  15 mL Mouth Rinse BID  . multivitamin with minerals  1 tablet Oral Daily  . pantoprazole (PROTONIX) IV  40 mg Intravenous Q24H  . propranolol  20 mg Oral Daily  . QUEtiapine  50 mg Oral QHS  . thiamine  100 mg Oral Daily   Or  . thiamine  100 mg Intravenous Daily   Continuous Infusions:   LOS: 6 days        Kathlen Mody, MD [facility]   To contact the attending provider between 7A-7P or the covering provider during after hours 7P-7A, please log into the web site [email or web address] and access  using universal [place] password for that web site. If you do not have the password, please call the hospital operator.  07/30/2019, 2:17 PM

## 2019-07-30 NOTE — TOC Progression Note (Signed)
Transition of Care [facility]) - Progression Note    Patient Details  Name: Deeric Cruise MRN: [id number] Date of Birth: 1970/07/15  Transition of Care [facility]) CM/SW Contact  Golda Acre, RN Phone Number: 07/30/2019, 7:40 AM  Clinical Narrative:    etoh w/d and pancreatitis/iv precedex for sedation on going. p-return to home once stable.   Expected Discharge Plan: Home/Self Care Barriers to Discharge: Continued Medical Work up  Expected Discharge Plan and Services Expected Discharge Plan: Home/Self Care   Discharge Planning Services: CM Consult   Living arrangements for the past 2 months: Single Family Home                                       Social Determinants of Health (SDOH) Interventions    Readmission Risk Interventions No flowsheet data found.

## 2019-07-30 NOTE — Progress Notes (Signed)
     [facility] Progress Note  CC:  Pancreatitis  Subjective:  Temp 101 overnight.  Still with about 8/10 pain.  Days that oxycodone does not help, dilaudid worked better.  Complaining about the Foley leaking and making a mess.  Tolerating full liquid diet.  Objective:  Vital signs in last 24 hours: Temp:  [98 F (36.7 C)-101 F (38.3 C)] 99.2 F (37.3 C) (07/28 0800) Pulse Rate:  [54-80] 80 (07/28 0913) Resp:  [6-33] 14 (07/28 0913) BP: (132-190)/(67-94) 160/93 (07/28 0800) SpO2:  [95 %-100 %] 95 % (07/28 0913) Last BM Date: 07/28/19 General:  Alert, Well-developed, in NAD Heart:  Regular rate and rhythm; no murmurs Pulm:  CTAB.  No increased WOB. Abdomen:  Soft, non-distended.  BS present.  Moderate TTP. Extremities:  Without edema. Neurologic:  Alert and oriented;  grossly normal neurologically.  Intake/Output from previous day: 07/27 0701 - 07/28 0700 In: 446.9 [I.V.:397.5; IV Piggyback:49.4] Out: 3850 [Urine:3850]  Lab Results: Recent Labs    07/29/19 0151 07/30/19 0211  WBC 5.9 11.3*  HGB 9.8* 10.3*  HCT 29.7* 30.8*  PLT 149* 214   BMET Recent Labs    07/28/19 0247 07/29/19 0151 07/30/19 0211  NA 136 136 134*  K 3.3* 3.6 3.2*  CL 101 101 94*  CO2 26 24 27   GLUCOSE 82 81 124*  BUN 9 12 7   CREATININE 0.56* 0.56* 0.58*  CALCIUM 7.6* 7.8* 7.7*   LFT Recent Labs    07/30/19 0211  PROT 4.9*  ALBUMIN 2.1*  AST 70*  ALT 53*  ALKPHOS 92  BILITOT 0.9   DG Chest Port 1 View  Result Date: 07/29/2019 CLINICAL DATA:  Respiratory distress. EXAM: PORTABLE CHEST 1 VIEW COMPARISON:  Radiograph 07/23/2019. FINDINGS: 0929 hours. The heart size is at the upper limits of normal for portable AP technique. There are lower lung volumes with new vascular congestion and probable edema. There are small bilateral pleural effusions with left-greater-than-right basilar airspace opacities. No pneumothorax. The bones appear intact. Telemetry leads overlie the  chest. IMPRESSION: Lower lung volumes with new vascular congestion, probable edema and small bilateral pleural effusions suggesting congestive heart failure. Bibasilar airspace opacities may reflect atelectasis or pneumonia. Electronically Signed   By: 07/31/2019 M.D.   On: 07/29/2019 09:52   Assessment / Plan: # Necrotizing Etoh pancreatitis with hemorrhagic component.  --WBC up just slightly today, had temp of 101 overnight. Normal renal function. --Day # 6 hospitalization --Tolerating full liquids.  Would leave him on this diet for now. --Continue IVF --Will need repeat CT scan, ? Thursday vs Friday.    # Elevated LFTs, likely Etoh hepatitis --AST and ALT up some again today.   # Alcoholism  --Being treated for DTs.  # Hypokalemia:  Replacement per primary service.   LOS: 6 days   Thursday. Jacoby Ritsema  07/30/2019, 9:16 AM

## 2019-07-31 ENCOUNTER — Inpatient Hospital Stay (HOSPITAL_COMMUNITY): Payer: 59

## 2019-07-31 DIAGNOSIS — R935 Abnormal findings on diagnostic imaging of other abdominal regions, including retroperitoneum: Secondary | ICD-10-CM

## 2019-07-31 DIAGNOSIS — K8591 Acute pancreatitis with uninfected necrosis, unspecified: Secondary | ICD-10-CM

## 2019-07-31 DIAGNOSIS — M7989 Other specified soft tissue disorders: Secondary | ICD-10-CM

## 2019-07-31 DIAGNOSIS — M79609 Pain in unspecified limb: Secondary | ICD-10-CM

## 2019-07-31 LAB — COMPREHENSIVE METABOLIC PANEL
ALT: 120 U/L — ABNORMAL HIGH (ref 0–44)
AST: 124 U/L — ABNORMAL HIGH (ref 15–41)
Albumin: 2 g/dL — ABNORMAL LOW (ref 3.5–5.0)
Alkaline Phosphatase: 132 U/L — ABNORMAL HIGH (ref 38–126)
Anion gap: 11 (ref 5–15)
BUN: <5 mg/dL — ABNORMAL LOW (ref 6–20)
CO2: 31 mmol/L (ref 22–32)
Calcium: 7.6 mg/dL — ABNORMAL LOW (ref 8.9–10.3)
Chloride: 95 mmol/L — ABNORMAL LOW (ref 98–111)
Creatinine, Ser: 0.66 mg/dL (ref 0.61–1.24)
GFR calc Af Amer: >60 mL/min (ref >60)
GFR calc non Af Amer: >60 mL/min (ref >60)
Glucose, Bld: 119 mg/dL — ABNORMAL HIGH (ref 70–99)
Potassium: 3.1 mmol/L — ABNORMAL LOW (ref 3.5–5.1)
Sodium: 137 mmol/L (ref 135–145)
Total Bilirubin: 0.8 mg/dL (ref 0.3–1.2)
Total Protein: 4.6 g/dL — ABNORMAL LOW (ref 6.5–8.1)

## 2019-07-31 LAB — CBC WITH DIFFERENTIAL/PLATELET
Abs Immature Granulocytes: 0.27 10*3/uL — ABNORMAL HIGH (ref 0.00–0.07)
Basophils Absolute: 0 10*3/uL (ref 0.0–0.1)
Basophils Relative: 1 %
Eosinophils Absolute: 0.1 10*3/uL (ref 0.0–0.5)
Eosinophils Relative: 1 %
HCT: 30.8 % — ABNORMAL LOW (ref 39.0–52.0)
Hemoglobin: 10.1 g/dL — ABNORMAL LOW (ref 13.0–17.0)
Immature Granulocytes: 4 %
Lymphocytes Relative: 10 %
Lymphs Abs: 0.7 10*3/uL (ref 0.7–4.0)
MCH: 31.7 pg (ref 26.0–34.0)
MCHC: 32.8 g/dL (ref 30.0–36.0)
MCV: 96.6 fL (ref 80.0–100.0)
Monocytes Absolute: 0.7 10*3/uL (ref 0.1–1.0)
Monocytes Relative: 9 %
Neutro Abs: 5.9 10*3/uL (ref 1.7–7.7)
Neutrophils Relative %: 75 %
Platelets: 251 10*3/uL (ref 150–400)
RBC: 3.19 MIL/uL — ABNORMAL LOW (ref 4.22–5.81)
RDW: 12.2 % (ref 11.5–15.5)
WBC: 7.7 10*3/uL (ref 4.0–10.5)
nRBC: 0 % (ref 0.0–0.2)

## 2019-07-31 LAB — MAGNESIUM: Magnesium: 1.6 mg/dL — ABNORMAL LOW (ref 1.7–2.4)

## 2019-07-31 LAB — CULTURE, BLOOD (ROUTINE X 2)
Culture: NO GROWTH
Culture: NO GROWTH
Special Requests: ADEQUATE

## 2019-07-31 LAB — PHOSPHORUS: Phosphorus: 3.1 mg/dL (ref 2.5–4.6)

## 2019-07-31 LAB — LIPASE, BLOOD: Lipase: 32 U/L (ref 11–51)

## 2019-07-31 MED ORDER — SODIUM CHLORIDE (PF) 0.9 % IJ SOLN
INTRAMUSCULAR | Status: AC
  Filled 2019-07-31: qty 50

## 2019-07-31 MED ORDER — POTASSIUM CHLORIDE 20 MEQ PO PACK: 40.0000 meq | PACK | Freq: Two times a day (BID) | ORAL | Status: DC

## 2019-07-31 MED ORDER — TRAMADOL HCL 50 MG PO TABS
100.0000 mg | ORAL_TABLET | Freq: Four times a day (QID) | ORAL | Status: DC | PRN
  Administered 2019-07-31 – 2019-08-06 (×9): 100 mg via ORAL
  Filled 2019-07-31 (×10): qty 2

## 2019-07-31 MED ORDER — IOHEXOL 9 MG/ML PO SOLN
ORAL | Status: AC
  Filled 2019-07-31: qty 1000

## 2019-07-31 MED ORDER — IOHEXOL 300 MG/ML  SOLN
100.0000 mL | Freq: Once | INTRAMUSCULAR | Status: AC | PRN
  Administered 2019-07-31: 100 mL via INTRAVENOUS

## 2019-07-31 MED ORDER — POTASSIUM CHLORIDE CRYS ER 10 MEQ PO TBCR
40.0000 meq | EXTENDED_RELEASE_TABLET | Freq: Two times a day (BID) | ORAL | Status: AC
  Administered 2019-07-31 (×2): 40 meq via ORAL
  Filled 2019-07-31 (×2): qty 4

## 2019-07-31 MED ORDER — SODIUM CHLORIDE 0.9 % IV SOLN
1.0000 g | Freq: Three times a day (TID) | INTRAVENOUS | Status: DC
  Administered 2019-07-31 – 2019-08-04 (×12): 1 g via INTRAVENOUS
  Filled 2019-07-31 (×14): qty 1

## 2019-07-31 MED ORDER — MAGNESIUM SULFATE 2 GM/50ML IV SOLN
2.0000 g | Freq: Once | INTRAVENOUS | Status: AC
  Administered 2019-07-31: 2 g via INTRAVENOUS
  Filled 2019-07-31: qty 50

## 2019-07-31 MED ORDER — SODIUM CHLORIDE 0.45 % IV SOLN: INTRAVENOUS | Status: DC

## 2019-07-31 MED ORDER — IOHEXOL 9 MG/ML PO SOLN: 500.0000 mL | ORAL | Status: AC

## 2019-07-31 MED ORDER — OXYCODONE HCL ER 10 MG PO T12A
10.0000 mg | EXTENDED_RELEASE_TABLET | Freq: Two times a day (BID) | ORAL | Status: DC
  Administered 2019-07-31: 10 mg via ORAL
  Filled 2019-07-31: qty 1

## 2019-07-31 MED ORDER — ALUM & MAG HYDROXIDE-SIMETH 200-200-20 MG/5ML PO SUSP
15.0000 mL | ORAL | Status: DC | PRN
  Administered 2019-07-31 – 2019-08-04 (×2): 15 mL via ORAL
  Filled 2019-07-31 (×2): qty 30

## 2019-07-31 MED ORDER — HYDRALAZINE HCL 20 MG/ML IJ SOLN
20.0000 mg | INTRAMUSCULAR | Status: DC | PRN
  Administered 2019-07-31 – 2019-08-04 (×3): 20 mg via INTRAVENOUS
  Filled 2019-07-31 (×3): qty 1

## 2019-07-31 MED ORDER — OXYCODONE HCL 5 MG PO TABS: 5.0000 mg | ORAL_TABLET | ORAL | Status: DC | PRN

## 2019-07-31 NOTE — Progress Notes (Signed)
PROGRESS NOTE    Gary Atkinson  ZOX:[id number] DOB: Jul 30, 1970 DOA: 07/23/2019 PCP: Patient, No Pcp Per    No chief complaint on file.   Brief Narrative:  49 year old gentleman with prior history of hypertension, alcohol use, GERD, chronically elevated liveR enzymes presents to ED for nausea vomiting and abdominal pain.  On arrival to ED was found to have a lipase of 1500, elevated AST and ALT.  CT of the abdomen was consistent with acute pancreatitis and concern for development of necrosis.  Patient was admitted by [facility] and GI was consulted.  He underwent MRCP showing acute hemorrhagic necrotizing pancreatitis involving 50% of pancreatic parenchyma with no biliary duct dilatation and no choledocholithiasis but with diffuse hepatic steatosis.  Patient was transferred to progressive unit on 7/24 due to worsening alcohol withdrawals with active delirium tremens requiring IV Precedex drip for better control.  PCCM on board and assisting with withdrawal symptoms. Pt was weaned off precedex gtt and transferred to telemetry.   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 Lipase has improved.but patient continues to have persistent abdominal pain with some nausea, but without vomiting.  GI consulted , recommended repeating CT abdomen and pelvis.  Repeat imaging shows , increase in moderate peripancreatic fluid,consistent with acute pancreatitis. Areas of hypoenhancement within the  pancreatic head/uncinate process are consistent with a involving necrosis. Well-defined fluid adjacent the pancreatic tail, for which developing pseudocyst cannot be excluded.  continue with IV dilaudid for severe pain, and OxyContin 10 mg every 12 hours and oxycodone every 4 hours prn.   Aggressive hydration with IV fluids and IV anti emetics.  He was started on clears and advanced as tolerated.    Fever Blood cultures from 07/26/19 negative so far.  t max of 101. Will start the patient on meropenam. Check CBC.     Pulmonary vascular congestion, with bilateral pleural effusions: Probably from aggressive hydration.  Check echocardiogram .  Prn lasix.  Currently on RA does not complain of sob or chest pain.     Alcohol abuse with alcohol withdrawal symptoms and delirium tremens. He was started on Precedex drip on 7/25 due to worsening DTs was able to wean him off on 07/29/19 PCCM on board and appreciate their assistance. Continue with CIWA protocol with Ativan, thiamine, multivitamin, folic acid.   Wheezing Appears to have resolved with IV Lasix.  Nasal cannula oxygen as needed.  Continue with duo nebs as needed. No wheezing heard on exam today.  Diminished air entry at bases.     Hypomagnesemia hypokalemia hypophosphatemia and hypocalcemia Replaced, repeat in am.    Pancytopenia Probably secondary to bone marrow suppression from alcohol abuse.    Hyponatremia probably from fluid losses reactionary to acute pancreatitis superimposed on chronic alcohol abuse. Resolved.     Transaminitis Probably secondary to hepatic steatosis and /or  alcoholic hepatitis.   Essential hypertension Well-controlled bp parameters.    Substance abuse disorder Psychiatric consulted and recommended starting the patient on Seroquel 50 mg  at bedtime. No suicidal or homicidal ideations.    Elevated troponins probably secondary to demand ischemia.  / Coronary artery disease s/p  DES Currently off aspirin due to hemorrhagic pancreatic necrosis.    Acute thrombocytopenia  Resolved.    Anemia of acute illness Probably from acute pancreatitis.    DVT prophylaxis: SCD'S Code Status: full code.  Family Communication: none at bedside.  Disposition:   Status is: Inpatient  Remains inpatient appropriate because:IV treatments appropriate due to intensity of illness or inability to take PO   Dispo: The patient is from: Home              Anticipated d/c is to: PENDING              Anticipated d/c date is: > 3 days              Patient currently is not medically stable to d/c.       Consultants:   PSYCHIATRY  GI  PCCM  Procedures:  Ct abdomen and pelvis  Since 07/23/2019, increase in moderate peripancreatic fluid, consistent with acute pancreatitis. Areas of hypoenhancement within the pancreatic head/uncinate process are consistent with a involving necrosis. Well-defined fluid adjacent the pancreatic tail, for which developing pseudocyst cannot be excluded. 2. Increase in small volume abdominal pelvic ascites and anasarca. 3. New bibasilar airspace disease, favoring atelectasis. 4. Similar small bilateral pleural effusions. 5. Hepatic steatosis. 6. Left and possible right-sided colonic wall thickening indicative of colitis. This could be secondary to peripancreatic inflammation. 7. 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 No acute portal or splenic vein thrombus. 8. Aortic Atherosclerosis (ICD10-I70.0).  Antimicrobials:  Subjective: No sob, some coughing. No vomiting. No nausea .  Persistent abd pain.  Reports heart burn pt on PPI.   Objective: Vitals:   07/31/19 0946 07/31/19 1000 07/31/19 1220 07/31/19 1300  BP: (!) 144/77 (!) 153/80 (!) 154/81   Pulse: 87 84 68 73  Resp:  22 22 21   Temp:   99.1 F (37.3 C)   TempSrc:   Oral   SpO2:  93% 96% 100%  Weight:      Height:        Intake/Output Summary  (Last 24 hours) at 07/31/2019 1422 Last data filed at 07/31/2019 1225 Gross per 24 hour  Intake 350 ml  Output 3020 ml  Net -2670 ml   Filed Weights   07/23/19 2152 07/24/19 0552 07/26/19 1324  Weight: 72.6 kg 74.9 kg 84.7 kg    Examination:  General exam: NO DISTRESS noted.  Respiratory system: diminished air entry at bases. No wheezing heard.  Cardiovascular system: S1S2 heard, RRR, no JVD.  Gastrointestinal system: Abdomen is soft , generalized tenderness, bowel sounds wnl.  Central nervous system: alert and oriented , grossly non focal  Extremities: No pedal edema.  Skin: No rashes seen Psychiatry: Flat affect    Data Reviewed: I have personally reviewed following labs and imaging studies  CBC: Recent Labs  Lab 07/25/19 0517 07/26/19 0615 07/29/19 0151 07/30/19 0211  WBC 5.1 3.8* 5.9 11.3*  HGB 12.8* 9.9* 9.8* 10.3*  HCT 38.2* 28.6* 29.7* 30.8*  MCV 97.7 95.7 98.0 96.6  PLT PLATELET CLUMPS NOTED ON SMEAR, UNABLE TO ESTIMATE 83* 149* 214    Basic Metabolic Panel: Recent Labs  Lab 07/27/19 0215 07/28/19 0247 07/29/19 0151 07/30/19 0211 07/31/19 0201  NA 133* 136 136 134* 137  K 3.8 3.3* 3.6 3.2* 3.1*  CL 97* 101 101 94* 95*  CO2  27 26 24 27 31   GLUCOSE 101* 82 81 124* 119*  BUN 7 9 12 7  <5*  CREATININE 0.62 0.56* 0.56* 0.58* 0.66  CALCIUM 7.5* 7.6* 7.8* 7.7* 7.6*  MG 2.1 2.0 1.8 1.9 1.6*  PHOS 1.9* 3.8 3.9 4.5 3.1    GFR: Estimated Creatinine Clearance: 116.3 mL/min (by C-G formula based on SCr of 0.66 mg/dL).  Liver Function Tests: Recent Labs  Lab 07/26/19 0615 07/27/19 0215 07/28/19 0247 07/30/19 0211 07/31/19 0201  AST 61* 49* 37 70* 124*  ALT 48* 44 39 53* 120*  ALKPHOS 43 52 60 92 132*  BILITOT 1.1 1.0 0.8 0.9 0.8  PROT 5.5* 5.5* 5.2* 4.9* 4.6*  ALBUMIN 2.7* 2.6* 2.3* 2.1* 2.0*    CBG: No results for input(s): GLUCAP in the last 168 hours.   Recent Results (from the past 240 hour(s))  SARS Coronavirus 2 by RT PCR (hospital  order, performed in [facility] hospital lab) Nasopharyngeal Nasopharyngeal Swab     Status: None   Collection Time: 07/24/19 12:08 AM   Specimen: Nasopharyngeal Swab  Result Value Ref Range Status   SARS Coronavirus 2 NEGATIVE NEGATIVE Final    Comment: (NOTE) SARS-CoV-2 target nucleic acids are NOT DETECTED.  The SARS-CoV-2 RNA is generally detectable in upper and lower respiratory specimens during the acute phase of infection. The lowest concentration of SARS-CoV-2 viral copies this assay can detect is 250 copies / mL. A negative result does not preclude SARS-CoV-2 infection and should not be used as the sole basis for treatment or other patient management decisions.  A negative result may occur with improper specimen collection / handling, submission of specimen other than nasopharyngeal swab, presence of viral mutation(s) within the areas targeted by this assay, and inadequate number of viral copies (<250 copies / mL). A negative result must be combined with clinical observations, patient history, and epidemiological information.  Fact Sheet for Patients:   [facility]  Fact Sheet for Healthcare Providers: 07/26/19  This test is not yet approved or  cleared by the [employer] FDA and has been authorized for detection and/or diagnosis of SARS-CoV-2 by FDA under an Emergency Use Authorization (EUA).  This EUA will remain in effect (meaning this test can be used) for the duration of the COVID-19 declaration under Section 564(b)(1) of the Act, 21 U.S.C. section 360bbb-3(b)(1), unless the authorization is terminated or revoked sooner.  Performed at [facility], [address] [place], [address] [place]   Culture, blood (routine x 2)     Status: None   Collection Time: 07/26/19 11:20 AM   Specimen: BLOOD  Result Value Ref Range Status   Specimen Description   Final    BLOOD RIGHT ARM Performed at  [facility], [address], [place], [place]    Special Requests   Final    BOTTLES DRAWN AEROBIC AND ANAEROBIC Blood Culture adequate volume Performed at [facility], [address], [place], [place]    Culture   Final    NO GROWTH 5 DAYS Performed at [facility], [address] [place], [address] [place]    Report Status 07/31/2019 FINAL  Final  Culture, blood (routine x 2)     Status: None   Collection Time: 07/26/19 11:23 AM   Specimen: BLOOD  Result Value Ref Range Status   Specimen Description   Final    BLOOD BLOOD RIGHT HAND Performed at [facility],  [address] [place], [place] [address]    Special Requests   Final    BOTTLES DRAWN AEROBIC ONLY Performed at [facility], [address] [place], [place] [address]    Culture   Final    NO GROWTH 5 DAYS Performed at [facility], [address], [place], [place] [address]    Report Status 07/31/2019 FINAL  Final  MRSA PCR Screening     Status: None   Collection Time: 07/27/19  8:20 AM   Specimen: Nasal Mucosa; Nasopharyngeal  Result Value Ref Range Status   MRSA by PCR NEGATIVE NEGATIVE Final    Comment:        The GeneXpert MRSA Assay (FDA approved for NASAL specimens only), is one component of a comprehensive MRSA colonization surveillance program. It is not intended to diagnose MRSA infection nor to guide or monitor treatment for MRSA infections. Performed at [facility], [address] [place], [place] [address]          Radiology Studies: CT ABDOMEN PELVIS W CONTRAST  Result Date: 07/31/2019 CLINICAL DATA:  Follow-up of pancreatitis.  Abdominal pain. EXAM: CT ABDOMEN AND PELVIS WITH CONTRAST TECHNIQUE: Multidetector CT imaging of the abdomen and pelvis was performed using the standard protocol following bolus administration of intravenous contrast. CONTRAST:   OMNIPAQUE IOHEXOL 300 MG/ML  SOLN COMPARISON:  07/23/2019 FINDINGS: Lower chest: Motion degradation. New bibasilar airspace disease. Borderline cardiomegaly within LAD stent. Left circumflex coronary artery atherosclerosis. Small bilateral pleural effusions are unchanged. Hepatobiliary: Moderate hepatic steatosis, without focal liver lesion. Dependent hyperattenuation in the gallbladder could represent sludge. No specific evidence of acute cholecystitis. Common duct difficult to follow. No evidence of biliary duct dilatation. Pancreas: Moderate peripancreatic fluid is increased. Areas of hypoenhancement, suspicious for necrosis identified within the head/uncinate process on 50/2 and tail on 39/2. These are similar in distribution to on the prior, slightly more well-defined today. Moderate peripancreatic fluid, most well-defined adjacent the tail, including at 7.3 x 6.4 cm on 38/2. Spleen: Normal in size, without focal abnormality. Adrenals/Urinary Tract: Normal adrenal glands. Normal kidneys, without hydronephrosis. Normal urinary bladder. Stomach/Bowel: Normal stomach, without wall thickening. The splenic flexure and descending colon are mildly thick walled, including on 44/2. Suspect right-sided colonic wall thickening on 52/2. Normal small bowel caliber. Vascular/Lymphatic: Aortic atherosclerosis. Patent portal and splenic veins. There are gastroepiploic collaterals, suggesting splenic vein insufficiency. Example 47/2. No abdominopelvic adenopathy. Reproductive: Normal prostate. Other: Intraperitoneal fluid is small volume, slightly increased. Minimal cul-de-sac fluid is similar. No free intraperitoneal air. Development of anasarca. Musculoskeletal: No acute osseous abnormality. IMPRESSION: 1. Since 07/23/2019, increase in moderate peripancreatic fluid, consistent with acute pancreatitis. Areas of hypoenhancement within the pancreatic head/uncinate process are consistent with a involving necrosis. Well-defined  fluid adjacent the pancreatic tail, for which developing pseudocyst cannot be excluded. 2. Increase in small volume abdominal pelvic ascites and anasarca. 3. New bibasilar airspace disease, favoring atelectasis. 4. Similar small bilateral pleural effusions. 5. Hepatic steatosis. 6. Left and possible right-sided colonic wall thickening indicative of colitis. This could be secondary to peripancreatic inflammation. 7. 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 No acute portal or splenic vein thrombus. 8. Aortic Atherosclerosis (ICD10-I70.0). Electronically Signed   By: Jeronimo Greaves M.D.   On: 07/31/2019 12:30   VAS [place] UPPER EXTREMITY VENOUS DUPLEX  Result Date: 07/31/2019 UPPER VENOUS STUDY  Indications: Pain and swelling in upper arm. Comparison Study: No prior studies. Performing Technologist: Jean Rosenthal  Examination  Guidelines: A complete evaluation includes B-mode imaging, spectral Doppler, color Doppler, and power Doppler as needed of all accessible portions of each vessel. Bilateral testing is considered an integral part of a complete examination. Limited examinations for reoccurring indications may be performed as noted.  Right Findings: +----------+------------+---------+-----------+----------+-------+ RIGHT     CompressiblePhasicitySpontaneousPropertiesSummary +----------+------------+---------+-----------+----------+-------+ Subclavian    Full       Yes       Yes                      +----------+------------+---------+-----------+----------+-------+  Left Findings: +----------+------------+---------+-----------+----------+-------+ LEFT      CompressiblePhasicitySpontaneousPropertiesSummary +----------+------------+---------+-----------+----------+-------+ IJV           Full       Yes       Yes                      +----------+------------+---------+-----------+----------+-------+ Subclavian    Full       Yes       Yes                       +----------+------------+---------+-----------+----------+-------+ Axillary      Full       Yes       Yes                      +----------+------------+---------+-----------+----------+-------+ Brachial      Full                                          +----------+------------+---------+-----------+----------+-------+ Radial        Full                                          +----------+------------+---------+-----------+----------+-------+ Ulnar         Full                                          +----------+------------+---------+-----------+----------+-------+ Cephalic    Partial                                  Acute  +----------+------------+---------+-----------+----------+-------+ Basilic       Full                                          +----------+------------+---------+-----------+----------+-------+  Summary:  Right: No evidence of thrombosis in the subclavian.  Left: No evidence of deep vein thrombosis in the upper extremity. Findings consistent with acute superficial vein thrombosis involving the left cephalic vein.  *See table(s) above for measurements and observations.    Preliminary         Scheduled Meds: . busPIRone  20 mg Oral BID  . Chlorhexidine Gluconate Cloth  6 each Topical Daily  . diazepam  5 mg Oral BID  . feeding supplement  1 Container Oral TID BM  . fluvoxaMINE  150 mg Oral BID  . folic acid  1 mg Oral Daily  . iohexol      .  lisinopril  10 mg Oral Daily  . multivitamin with minerals  1 tablet Oral Daily  . pantoprazole (PROTONIX) IV  40 mg Intravenous Q24H  . potassium chloride  40 mEq Oral BID  . propranolol  20 mg Oral Daily  . QUEtiapine  50 mg Oral QHS  . sodium chloride (PF)      . thiamine  100 mg Oral Daily   Or  . thiamine  100 mg Intravenous Daily   Continuous Infusions:   LOS: 7 days        Kathlen ModyVijaya Katielynn Horan, MD [facility]   To contact the attending provider between 7A-7P or the  covering provider during after hours 7P-7A, please log into the web site [email or web address] and access using universal [place] password for that web site. If you do not have the password, please call the hospital operator.  07/31/2019, 2:22 PM

## 2019-07-31 NOTE — Progress Notes (Signed)
Upper extremity venous LT study completed.   Results discussed with RN.  See Cv Proc for preliminary results.   Gary Atkinson

## 2019-07-31 NOTE — Progress Notes (Addendum)
[facility] Progress Note  CC:  Pancreatitis  Subjective:  Pain is a little better.  Drinking contrast for CT can.  Objective:  Vital signs in last 24 hours: Temp:  [98.7 F (37.1 C)-101 F (38.3 C)] 98.7 F (37.1 C) (07/29 0800) Pulse Rate:  [76-94] 85 (07/29 0800) Resp:  [11-33] 23 (07/29 0800) BP: (123-174)/(66-93) 150/81 (07/29 0800) SpO2:  [85 %-98 %] 91 % (07/29 0800) Last BM Date: 07/28/19 General:  Alert, Well-developed, in NAD Heart:  Regular rate and rhythm; no murmurs Pulm:  CTAB.  No increased WOB. Abdomen:  Soft, non-distended.  BS present.  Mild TTP. Extremities:  Without edema. Neurologic:  Alert and oriented x 4;  grossly normal neurologically.  Intake/Output from previous day: 07/28 0701 - 07/29 0700 In: 300 [P.O.:300] Out: 1500 [Urine:1500] Intake/Output this shift: Total I/O In: -  Out: 600 [Urine:600]  Lab Results: Recent Labs    07/29/19 0151 07/30/19 0211  WBC 5.9 11.3*  HGB 9.8* 10.3*  HCT 29.7* 30.8*  PLT 149* 214   BMET Recent Labs    07/29/19 0151 07/30/19 0211 07/31/19 0201  NA 136 134* 137  K 3.6 3.2* 3.1*  CL 101 94* 95*  CO2 24 27 31   GLUCOSE 81 124* 119*  BUN 12 7 <5*  CREATININE 0.56* 0.58* 0.66  CALCIUM 7.8* 7.7* 7.6*   LFT Recent Labs    07/31/19 0201  PROT 4.6*  ALBUMIN 2.0*  AST 124*  ALT 120*  ALKPHOS 132*  BILITOT 0.8   DG Chest Port 1 View  Result Date: 07/29/2019 CLINICAL DATA:  Respiratory distress. EXAM: PORTABLE CHEST 1 VIEW COMPARISON:  Radiograph 07/23/2019. FINDINGS: 0929 hours. The heart size is at the upper limits of normal for portable AP technique. There are lower lung volumes with new vascular congestion and probable edema. There are small bilateral pleural effusions with left-greater-than-right basilar airspace opacities. No pneumothorax. The bones appear intact. Telemetry leads overlie the chest. IMPRESSION: Lower lung volumes with new vascular congestion, probable edema  and small bilateral pleural effusions suggesting congestive heart failure. Bibasilar airspace opacities may reflect atelectasis or pneumonia. Electronically Signed   By: 07/25/2019 M.D.   On: 07/29/2019 09:52   VAS 07/31/2019 UPPER EXTREMITY VENOUS DUPLEX  Result Date: 07/31/2019 UPPER VENOUS STUDY  Indications: Pain and swelling in upper arm. Comparison Study: No prior studies. Performing Technologist: 08/02/2019  Examination Guidelines: A complete evaluation includes B-mode imaging, spectral Doppler, color Doppler, and power Doppler as needed of all accessible portions of each vessel. Bilateral testing is considered an integral part of a complete examination. Limited examinations for reoccurring indications may be performed as noted.  Right Findings: +----------+------------+---------+-----------+----------+-------+ RIGHT     CompressiblePhasicitySpontaneousPropertiesSummary +----------+------------+---------+-----------+----------+-------+ Subclavian    Full       Yes       Yes                      +----------+------------+---------+-----------+----------+-------+  Left Findings: +----------+------------+---------+-----------+----------+-------+ LEFT      CompressiblePhasicitySpontaneousPropertiesSummary +----------+------------+---------+-----------+----------+-------+ IJV           Full       Yes       Yes                      +----------+------------+---------+-----------+----------+-------+ Subclavian    Full       Yes       Yes                      +----------+------------+---------+-----------+----------+-------+  Axillary      Full       Yes       Yes                      +----------+------------+---------+-----------+----------+-------+ Brachial      Full                                          +----------+------------+---------+-----------+----------+-------+ Radial        Full                                           +----------+------------+---------+-----------+----------+-------+ Ulnar         Full                                          +----------+------------+---------+-----------+----------+-------+ Cephalic    Partial                                  Acute  +----------+------------+---------+-----------+----------+-------+ Basilic       Full                                          +----------+------------+---------+-----------+----------+-------+  Summary:  Right: No evidence of thrombosis in the subclavian.  Left: No evidence of deep vein thrombosis in the upper extremity. Findings consistent with acute superficial vein thrombosis involving the left cephalic vein.  *See table(s) above for measurements and observations.    Preliminary    Assessment / Plan: # Necrotizing Etoh pancreatitis with hemorrhagic component. --Had temp of 101 overnight. Normal renal function. --Day #7 hospitalization --Tolerating full liquids.  Would leave him on this diet for now. --Continue IVFs. --Will repeat CT scan today.  #Elevated LFTs, likely Etoh hepatitis --AST and ALT increased further today.   # Alcoholism  --Being treated for DTs.  # Hypokalemia:  K+ 3.1 today.  Replacement per primary service.   LOS: 7 days   Princella Pellegrini. Zehr  07/31/2019, 8:55 AM  GI ATTENDING  Interval history data reviewed.  Patient seen and examined as outlined above.  Agree with interval progress note as outlined above.  Patient is clinically stable.  I have reviewed his CT scan which finds area of necrosis and an organized fluid collection.  No gas within the necrosis or collection to suggest obvious infection.  However, need to keep an eye on his fevers and white blood cell count.  I would have a low threshold to initiate antibiotic therapy, such as meropenem, if he remains febrile with rising white blood cell count.  Continue with vigorous hydration, potassium repletion, treatment of DTs, and pain management.   GI will continue to follow.  Wilhemina Bonito. Eda Keys., M.D. [facility] Division of Gastroenterology

## 2019-07-31 NOTE — Progress Notes (Addendum)
Patient became agitated/anxious, bed alarm went off and RN to bedside. When RN walked in, patient was attempting to get out of bed and had ripped out his IV. Patient stated that he had a nightmare and that he wasn't sure what happened. Patient had received oxycodone about 2 hours prior. He had a similar episode after an previous dose of oxycodone earlier during the shift. MD made aware and new orders received. IV team paged for assistance with new IV. Will continue to monitor.

## 2019-07-31 NOTE — Progress Notes (Signed)
Patient has finished drinking oral contrast, CT made aware.

## 2019-07-31 NOTE — Progress Notes (Signed)
Pharmacy Antibiotic Note  Gary Atkinson is a 49 y.o. male presented to the ED on 07/23/2019 with c/o abdominal pain and n/v. Abdominal CT showed findings consistent with acute pancreatitis and areas with concern for necrosis.  Pharmacy is consulted on 7/29 to start meropenem for infection.  Today, 07/31/2019: - Tmax 101, wbc 11.3 - scr 0.66 (crcl~100)  Plan: - meropenem 1gm IV q8h  _____________________________________  Height: 5\' 11"  (180.3 cm) Weight: 84.7 kg (186 lb 11.2 oz) IBW/kg (Calculated) : 75.3  Temp (24hrs), Avg:99.7 F (37.6 C), Min:98.7 F (37.1 C), Max:101 F (38.3 C)  Recent Labs  Lab 07/25/19 0517 07/25/19 0517 07/26/19 0615 07/26/19 0615 07/27/19 0215 07/28/19 0247 07/29/19 0151 07/30/19 0211 07/31/19 0201  WBC 5.1  --  3.8*  --   --   --  5.9 11.3*  --   CREATININE 0.70   < > 0.77   < > 0.62 0.56* 0.56* 0.58* 0.66   < > = values in this interval not displayed.    Estimated Creatinine Clearance: 116.3 mL/min (by C-G formula based on SCr of 0.66 mg/dL).    No Known Allergies   Thank you for allowing pharmacy to be a part of this patients care.  08/02/19 07/31/2019 2:39 PM

## 2019-08-01 ENCOUNTER — Inpatient Hospital Stay (HOSPITAL_COMMUNITY): Payer: 59

## 2019-08-01 DIAGNOSIS — I351 Nonrheumatic aortic (valve) insufficiency: Secondary | ICD-10-CM

## 2019-08-01 DIAGNOSIS — R509 Fever, unspecified: Secondary | ICD-10-CM | POA: Diagnosis not present

## 2019-08-01 DIAGNOSIS — K8521 Alcohol induced acute pancreatitis with uninfected necrosis: Principal | ICD-10-CM

## 2019-08-01 LAB — CBC
HCT: 29 % — ABNORMAL LOW (ref 39.0–52.0)
Hemoglobin: 9.8 g/dL — ABNORMAL LOW (ref 13.0–17.0)
MCH: 31.7 pg (ref 26.0–34.0)
MCHC: 33.8 g/dL (ref 30.0–36.0)
MCV: 93.9 fL (ref 80.0–100.0)
Platelets: 212 10*3/uL (ref 150–400)
RBC: 3.09 MIL/uL — ABNORMAL LOW (ref 4.22–5.81)
RDW: 12.7 % (ref 11.5–15.5)
WBC: 9.4 10*3/uL (ref 4.0–10.5)
nRBC: 0 % (ref 0.0–0.2)

## 2019-08-01 LAB — URINALYSIS, ROUTINE W REFLEX MICROSCOPIC
Bilirubin Urine: NEGATIVE
Glucose, UA: NEGATIVE mg/dL
Hgb urine dipstick: NEGATIVE
Ketones, ur: NEGATIVE mg/dL
Leukocytes,Ua: NEGATIVE
Nitrite: NEGATIVE
Protein, ur: NEGATIVE mg/dL
Specific Gravity, Urine: 1 — ABNORMAL LOW (ref 1.005–1.030)
pH: 7 (ref 5.0–8.0)

## 2019-08-01 LAB — COMPREHENSIVE METABOLIC PANEL
ALT: 76 U/L — ABNORMAL HIGH (ref 0–44)
AST: 50 U/L — ABNORMAL HIGH (ref 15–41)
Albumin: 1.9 g/dL — ABNORMAL LOW (ref 3.5–5.0)
Alkaline Phosphatase: 102 U/L (ref 38–126)
Anion gap: 14 (ref 5–15)
BUN: <5 mg/dL — ABNORMAL LOW (ref 6–20)
CO2: 25 mmol/L (ref 22–32)
Calcium: 7.5 mg/dL — ABNORMAL LOW (ref 8.9–10.3)
Chloride: 95 mmol/L — ABNORMAL LOW (ref 98–111)
Creatinine, Ser: 0.59 mg/dL — ABNORMAL LOW (ref 0.61–1.24)
GFR calc Af Amer: >60 mL/min (ref >60)
GFR calc non Af Amer: >60 mL/min (ref >60)
Glucose, Bld: 75 mg/dL (ref 70–99)
Potassium: 3.7 mmol/L (ref 3.5–5.1)
Sodium: 134 mmol/L — ABNORMAL LOW (ref 135–145)
Total Bilirubin: 0.6 mg/dL (ref 0.3–1.2)
Total Protein: 5 g/dL — ABNORMAL LOW (ref 6.5–8.1)

## 2019-08-01 LAB — PHOSPHORUS: Phosphorus: 3 mg/dL (ref 2.5–4.6)

## 2019-08-01 LAB — ECHOCARDIOGRAM COMPLETE
Area-P 1/2: 3.16 cm2
Height: 71 in
S' Lateral: 3.2 cm
Weight: 2987.2 oz

## 2019-08-01 LAB — MAGNESIUM: Magnesium: 1.7 mg/dL (ref 1.7–2.4)

## 2019-08-01 LAB — LIPASE, BLOOD: Lipase: 37 U/L (ref 11–51)

## 2019-08-01 NOTE — TOC Progression Note (Addendum)
Transition of Care [facility]) - Progression Note    Patient Details  Name: Gary Atkinson MRN: [id number] Date of Birth: 26-Jan-1970  Transition of [facility] CM/SW Contact  Golda Acre, RN Phone Number: 08/01/2019, 7:49 AM  Clinical Narrative:    Alert and taking p.o's, iv merrem, iv 1/2 ns at 100cc/hr.  Note to attending to see if patient still needs to be in icu. Patient is actively awaiting transfer to tele bed.  Expected Discharge Plan: Home/Self Care Barriers to Discharge: Continued Medical Work up  Expected Discharge Plan and Services Expected Discharge Plan: Home/Self Care   Discharge Planning Services: CM Consult   Living arrangements for the past 2 months: Single Family Home                                       Social Determinants of Health (SDOH) Interventions    Readmission Risk Interventions No flowsheet data found.

## 2019-08-01 NOTE — Progress Notes (Signed)
[facility] Progress Note  CC:  Pancreatitis  Subjective:  Temp of 102.3 this AM and gown was soaked.  He said that he feels a like better though.  Only using tramadol for pain.  Wants to eat solid food.  Repeat CT scan yesterday, 7/29 showed the following:  IMPRESSION: 1. Since 07/23/2019, increase in moderate peripancreatic fluid, consistent with acute pancreatitis. Areas of hypoenhancement within the pancreatic head/uncinate process are consistent with a involving necrosis. Well-defined fluid adjacent the pancreatic tail, for which developing pseudocyst cannot be excluded. 2. Increase in small volume abdominal pelvic ascites and anasarca. 3. New bibasilar airspace disease, favoring atelectasis. 4. Similar small bilateral pleural effusions. 5. Hepatic steatosis. 6. Left and possible right-sided colonic wall thickening indicative of colitis. This could be secondary to peripancreatic inflammation. 7. 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 No acute portal or splenic vein thrombus. 8. Aortic Atherosclerosis (ICD10-I70.0).  Objective:  Vital signs in last 24 hours: Temp:  [98.1 F (36.7 C)-102.3 F (39.1 C)] 98.8 F (37.1 C) (07/30 0939) Pulse Rate:  [66-104] 72 (07/30 1055) Resp:  [15-24] 24 (07/30 1055) BP: (112-190)/(59-103) 127/59 (07/30 1055) SpO2:  [90 %-100 %] 98 % (07/30 1055) Last BM Date: 07/31/19 General:  Alert, Well-developed, in NAD Heart:  Regular rate and rhythm; no murmurs Pulm:  CTAB.  No increased WOB. Abdomen:  Soft, non-distended.  BS present.  Minimal TTP.   Extremities:  Without edema. Neurologic:  Alert and oriented x 4;  grossly normal neurologically. Psych:  Alert and cooperative. Normal mood and affect.  Intake/Output from previous day: 07/29 0701 - 07/30 0700 In: 1356.1 [I.V.:1109; IV Piggyback:247.1] Out: 2020 [Urine:2020]  Lab Results: Recent Labs    07/30/19 0211 07/31/19 1500  08/01/19 0207  WBC 11.3* 7.7 9.4  HGB 10.3* 10.1* 9.8*  HCT 30.8* 30.8* 29.0*  PLT 214 251 212   BMET Recent Labs    07/30/19 0211 07/31/19 0201 08/01/19 0207  NA 134* 137 134*  K 3.2* 3.1* 3.7  CL 94* 95* 95*  CO2 27 31 25   GLUCOSE 124* 119* 75  BUN 7 <5* <5*  CREATININE 0.58* 0.66 0.59*  CALCIUM 7.7* 7.6* 7.5*   LFT Recent Labs    08/01/19 0207  PROT 5.0*  ALBUMIN 1.9*  AST 50*  ALT 76*  ALKPHOS 102  BILITOT 0.6   DG Chest 2 View  Result Date: 08/01/2019 CLINICAL DATA:  Fever EXAM: CHEST - 2 VIEW COMPARISON:  07/29/2019 FINDINGS: Heart is normal size. Bilateral lower lobe airspace opacities are noted, slightly improved since prior study. No visible effusions or pneumothorax. No acute bony abnormality. IMPRESSION: Persistent but improving bilateral lower lobe airspace opacities. Electronically Signed   By: 07/31/2019 M.D.   On: 08/01/2019 11:22   CT ABDOMEN PELVIS W CONTRAST  Result Date: 07/31/2019 CLINICAL DATA:  Follow-up of pancreatitis.  Abdominal pain. EXAM: CT ABDOMEN AND PELVIS WITH CONTRAST TECHNIQUE: Multidetector CT imaging of the abdomen and pelvis was performed using the standard protocol following bolus administration of intravenous contrast. CONTRAST:  08/02/2019 OMNIPAQUE IOHEXOL 300 MG/ML  SOLN COMPARISON:  07/23/2019 FINDINGS: Lower chest: Motion degradation. New bibasilar airspace disease. Borderline cardiomegaly within LAD stent. Left circumflex coronary artery atherosclerosis. Small bilateral pleural effusions are unchanged. Hepatobiliary: Moderate hepatic steatosis, without focal liver lesion. Dependent hyperattenuation in the gallbladder could represent sludge. No specific evidence of acute cholecystitis. Common duct difficult to follow. No evidence of biliary duct dilatation. Pancreas: Moderate peripancreatic fluid is increased.  Areas of hypoenhancement, suspicious for necrosis identified within the head/uncinate process on 50/2 and tail on 39/2. These are  similar in distribution to on the prior, slightly more well-defined today. Moderate peripancreatic fluid, most well-defined adjacent the tail, including at 7.3 x 6.4 cm on 38/2. Spleen: Normal in size, without focal abnormality. Adrenals/Urinary Tract: Normal adrenal glands. Normal kidneys, without hydronephrosis. Normal urinary bladder. Stomach/Bowel: Normal stomach, without wall thickening. The splenic flexure and descending colon are mildly thick walled, including on 44/2. Suspect right-sided colonic wall thickening on 52/2. Normal small bowel caliber. Vascular/Lymphatic: Aortic atherosclerosis. Patent portal and splenic veins. There are gastroepiploic collaterals, suggesting splenic vein insufficiency. Example 47/2. No abdominopelvic adenopathy. Reproductive: Normal prostate. Other: Intraperitoneal fluid is small volume, slightly increased. Minimal cul-de-sac fluid is similar. No free intraperitoneal air. Development of anasarca. Musculoskeletal: No acute osseous abnormality. IMPRESSION: 1. Since 07/23/2019, increase in moderate peripancreatic fluid, consistent with acute pancreatitis. Areas of hypoenhancement within the pancreatic head/uncinate process are consistent with a involving necrosis. Well-defined fluid adjacent the pancreatic tail, for which developing pseudocyst cannot be excluded. 2. Increase in small volume abdominal pelvic ascites and anasarca. 3. New bibasilar airspace disease, favoring atelectasis. 4. Similar small bilateral pleural effusions. 5. Hepatic steatosis. 6. Left and possible right-sided colonic wall thickening indicative of colitis. This could be secondary to peripancreatic inflammation. 7. 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 No acute portal or splenic vein thrombus. 8. Aortic Atherosclerosis (ICD10-I70.0). Electronically Signed   By: Jeronimo Greaves M.D.   On: 07/31/2019 12:30   ECHOCARDIOGRAM COMPLETE  Result Date: 08/01/2019    ECHOCARDIOGRAM  REPORT   Patient Name:   LUCILLE CRICHLOW Date of Exam: 08/01/2019 Medical Rec #:  [id number]    Height:       71.0 in Accession #:    [id number]   Weight:       186.7 lb Date of Birth:  1970-08-27    BSA:          2.048 m Patient Age:    49 years     BP:           157/72 mmHg Patient Gender: M            HR:           98 bpm. Exam Location:  Inpatient Procedure: 2D Echo Indications:    Dyspnea R06.00  History:        Patient has prior history of Echocardiogram examinations, most                 recent 08/25/2013. CAD and Previous Myocardial Infarction; Risk                 Factors:Hypertension and Dyslipidemia.  Sonographer:    Thurman Coyer RDCS (AE) Referring Phys: Oliver Hum VIJAYA AKULA IMPRESSIONS  1. Left ventricular ejection fraction, by estimation, is 60 to 65%. The left ventricle has normal function. The left ventricle has no regional wall motion abnormalities. Left ventricular diastolic parameters were normal.  2. Right ventricular systolic function is normal. The right ventricular size is normal. Tricuspid regurgitation signal is inadequate for assessing PA pressure.  3. The mitral valve is normal in structure. No evidence of mitral valve regurgitation. No evidence of mitral stenosis.  4. The aortic valve is normal in structure. Aortic valve regurgitation is mild. No aortic stenosis is present.  5. The inferior vena cava is normal in size with greater than 50% respiratory variability, suggesting right atrial pressure of 3 mmHg. FINDINGS  Left Ventricle: Left ventricular ejection fraction, by estimation, is 60 to 65%. The left ventricle has normal function. The left ventricle has no regional wall motion abnormalities. The left ventricular internal cavity size was normal in size. There is  no left ventricular hypertrophy. Left ventricular diastolic parameters were normal. Normal left ventricular filling pressure. Right Ventricle: The right ventricular size is normal. No increase in right ventricular wall thickness.  Right ventricular systolic function is normal. Tricuspid regurgitation signal is inadequate for assessing PA pressure. Left Atrium: Left atrial size was normal in size. Right Atrium: Right atrial size was normal in size. Pericardium: There is no evidence of pericardial effusion. Mitral Valve: The mitral valve is normal in structure. Normal mobility of the mitral valve leaflets. No evidence of mitral valve regurgitation. No evidence of mitral valve stenosis. Tricuspid Valve: The tricuspid valve is normal in structure. Tricuspid valve regurgitation is trivial. No evidence of tricuspid stenosis. Aortic Valve: The aortic valve is normal in structure. Aortic valve regurgitation is mild. No aortic stenosis is present. Pulmonic Valve: The pulmonic valve was normal in structure. Pulmonic valve regurgitation is not visualized. No evidence of pulmonic stenosis. Aorta: The aortic root is normal in size and structure. Venous: The inferior vena cava is normal in size with greater than 50% respiratory variability, suggesting right atrial pressure of 3 mmHg. IAS/Shunts: No atrial level shunt detected by color flow Doppler.  LEFT VENTRICLE PLAX 2D LVIDd:         5.00 cm  Diastology LVIDs:         3.20 cm  LV e' lateral:   13.50 cm/s LV PW:         1.10 cm  LV E/e' lateral: 8.1 LV IVS:        0.90 cm  LV e' medial:    8.38 cm/s LVOT diam:     2.10 cm  LV E/e' medial:  13.0 LV SV:         95 LV SV Index:   46 LVOT Area:     3.46 cm  RIGHT VENTRICLE RV S prime:     17.10 cm/s TAPSE (M-mode): 2.3 cm LEFT ATRIUM             Index       RIGHT ATRIUM           Index LA diam:        3.70 cm 1.81 cm/m  RA Area:     14.60 cm LA Vol (A2C):   47.9 ml 23.39 ml/m RA Volume:   31.70 ml  15.48 ml/m LA Vol (A4C):   39.9 ml 19.48 ml/m LA Biplane Vol: 44.7 ml 21.83 ml/m  AORTIC VALVE LVOT Vmax:   161.00 cm/s LVOT Vmean:  98.200 cm/s LVOT VTI:    0.274 m  AORTA Ao Root diam: 3.60 cm MITRAL VALVE MV Area (PHT): 3.16 cm     SHUNTS MV Decel Time:  240 msec     Systemic VTI:  0.27 m MV E velocity: 109.00 cm/s  Systemic Diam: 2.10 cm MV A velocity: 87.70 cm/s MV E/A ratio:  1.24 Armanda Magic MD Electronically signed by Armanda Magic MD Signature Date/Time: 08/01/2019/9:28:17 AM    Final    VAS [place] UPPER EXTREMITY VENOUS DUPLEX  Result Date: 07/31/2019 UPPER VENOUS STUDY  Indications: Pain and swelling in upper arm. Comparison Study: No prior studies. Performing Technologist: Jean Rosenthal  Examination Guidelines: A complete evaluation includes B-mode imaging, spectral Doppler, color Doppler, and power Doppler  as needed of all accessible portions of each vessel. Bilateral testing is considered an integral part of a complete examination. Limited examinations for reoccurring indications may be performed as noted.  Right Findings: +----------+------------+---------+-----------+----------+-------+ RIGHT     CompressiblePhasicitySpontaneousPropertiesSummary +----------+------------+---------+-----------+----------+-------+ Subclavian    Full       Yes       Yes                      +----------+------------+---------+-----------+----------+-------+  Left Findings: +----------+------------+---------+-----------+----------+-------+ LEFT      CompressiblePhasicitySpontaneousPropertiesSummary +----------+------------+---------+-----------+----------+-------+ IJV           Full       Yes       Yes                      +----------+------------+---------+-----------+----------+-------+ Subclavian    Full       Yes       Yes                      +----------+------------+---------+-----------+----------+-------+ Axillary      Full       Yes       Yes                      +----------+------------+---------+-----------+----------+-------+ Brachial      Full                                          +----------+------------+---------+-----------+----------+-------+ Radial        Full                                           +----------+------------+---------+-----------+----------+-------+ Ulnar         Full                                          +----------+------------+---------+-----------+----------+-------+ Cephalic    Partial                                  Acute  +----------+------------+---------+-----------+----------+-------+ Basilic       Full                                          +----------+------------+---------+-----------+----------+-------+  Summary:  Right: No evidence of thrombosis in the subclavian.  Left: No evidence of deep vein thrombosis in the upper extremity. Findings consistent with acute superficial vein thrombosis involving the left cephalic vein.  *See table(s) above for measurements and observations.  Diagnosing physician: Sherald Hesshristopher Clark MD Electronically signed by Sherald Hesshristopher Clark MD on 07/31/2019 at 3:46:14 PM.    Final    Assessment / Plan: # Necrotizing Etoh pancreatitis with hemorrhagic component. --Had temp of 102.3 this AM.Normal renal function. --Day #8hospitalization --Tolerating full liquids, but not taking much of it and wants to eat solid food. --Continue IVFs. --Repeat CT scan with an area of necrosis and an organized fluid collection.  Nothing to suggest obvious infection by CT, but meropenem was initiated on  7/29.  #Elevated LFTs, likely Etoh hepatitis --AST and ALT down today.  # Alcoholism  --Being treated for DTs.  # Hypokalemia: Improved today with K+ 3.7.   LOS: 8 days   Princella Pellegrini. Arlethia Basso  08/01/2019, 11:48 AM

## 2019-08-01 NOTE — Progress Notes (Signed)
  Echocardiogram 2D Echocardiogram has been performed.  Gary Atkinson 08/01/2019, 9:07 AM

## 2019-08-01 NOTE — Progress Notes (Signed)
PROGRESS NOTE    Gary Atkinson  QIO:[id number] DOB: 24-May-1970 DOA: 07/23/2019 PCP: Patient, No Pcp Per    No chief complaint on file.   Brief Narrative:  49 year old gentleman with prior history of hypertension, alcohol use, GERD, chronically elevated liveR enzymes presents to ED for nausea vomiting and abdominal pain.  On arrival to ED was found to have a lipase of 1500, elevated AST and ALT.  CT of the abdomen was consistent with acute pancreatitis and concern for development of necrosis.  Patient was admitted by [facility] and GI was consulted.  He underwent MRCP showing acute hemorrhagic necrotizing pancreatitis involving 50% of pancreatic parenchyma with no biliary duct dilatation and no choledocholithiasis but with diffuse hepatic steatosis.  Patient was transferred to progressive unit on 7/24 due to worsening alcohol withdrawals with active delirium tremens requiring IV Precedex drip for better control.  PCCM on board and assisting with withdrawal symptoms. Pt was weaned off precedex gtt and transferred to telemetry. Hospital course complicated by fevers , with T MAX OF 102. He was started on Merrem on 7/29 for necrotizing pancreatitis. .  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 Lipase has improved.but patient continues to have persistent abdominal pain with some nausea, but without vomiting.  GI consulted , recommended repeating CT abdomen and pelvis.  Repeat imaging shows ,  increase in moderate peripancreatic fluid,consistent with acute pancreatitis. Areas of hypoenhancement within the pancreatic head/uncinate process are consistent with a involving necrosis. Well-defined fluid adjacent the pancreatic tail, for which developing pseudocyst cannot be excluded.  continue with IV dilaudid for severe pain, and tramadol for moderate pain. Pt having nightmares and agitation with oxycodone and OxyContin.  Aggressive hydration with IV fluids and IV anti emetics.  He was started on clears and advanced to full liquid diet and able to tolerate.    Persistent fevers Blood cultures from 07/26/19 negative so far. Repeat cultures ordered.  t max of 102.3. started on meropenam empirically.  CXR and UA ordered to complete the work up.     Pulmonary vascular congestion, with bilateral pleural effusions: Probably from aggressive hydration.  Echocardiogram wnl. Currently on RA does not complain of sob or chest pain.     Alcohol abuse with alcohol withdrawal symptoms and delirium tremens. He was started on Precedex drip on 7/25 due to worsening DTs was able to wean him off on 07/29/19 PCCM on board and appreciate their assistance. Continue with CIWA protocol with Ativan, thiamine, multivitamin, folic acid.   Wheezing Appears to have resolved with IV Lasix.  Nasal cannula oxygen as needed.  Continue with duo nebs as needed.     Hypomagnesemia hypokalemia hypophosphatemia and hypocalcemia Replaced, repeat levels wnl.    Pancytopenia Probably secondary to bone marrow suppression from alcohol abuse.    Hyponatremia probably from fluid losses reactionary to acute pancreatitis superimposed on chronic alcohol abuse. Improved, sodium of 134 today.     Transaminitis Probably secondary to hepatic steatosis and /or  alcoholic hepatitis.improving  ALT IS 76 and AST IS 50.   Essential hypertension Well controlled.    Substance abuse disorder Psychiatric consulted and  recommended starting the patient on Seroquel 50 mg at bedtime. No suicidal or homicidal ideations.    Elevated troponins probably secondary to demand ischemia.  / Coronary artery disease s/p DES Currently off aspirin due to hemorrhagic pancreatic necrosis.    Acute thrombocytopenia  Resolved.    Anemia of acute illness Probably from acute pancreatitis.  Hemoglobin 13.8 >12.8>9.9>9.8 since admission.  Anemia panel shows low iron levels, will need supplementation on discharge .   PT evaluation recommending SNF. toc consult in place.   DVT prophylaxis: SCD'S Code Status: full code.  Family Communication: none at bedside.  Disposition:   Status is: Inpatient  Remains inpatient appropriate because:IV treatments appropriate due to intensity of illness or inability to take PO   Dispo: The patient is from: Home              Anticipated d/c is to: PENDING              Anticipated d/c date is: > 3 days              Patient currently is not medically stable to d/c.       Consultants:   PSYCHIATRY  GI  PCCM  Procedures:  Ct abdomen and pelvis  Since 07/23/2019, increase in moderate peripancreatic fluid, consistent with acute pancreatitis. Areas of hypoenhancement within the pancreatic head/uncinate process are consistent with a involving necrosis. Well-defined fluid adjacent the pancreatic tail, for which developing pseudocyst cannot be excluded. 2. Increase in small volume abdominal pelvic ascites and anasarca. 3. New bibasilar airspace disease, favoring atelectasis. 4. Similar small bilateral pleural effusions. 5. Hepatic steatosis. 6. Left and possible right-sided colonic wall thickening indicative of colitis. This could be secondary to peripancreatic inflammation. 7. 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 No acute portal or splenic vein thrombus. 8. Aortic Atherosclerosis (ICD10-I70.0).  Antimicrobials:  Subjective: abd pain  improving, no nausea, or vomiting.   Objective: Vitals:   08/01/19 0342 08/01/19 0400 08/01/19 0628 08/01/19 0800  BP:  (!) 134/65 (!) 157/72 (!) 139/60  Pulse:  88 98 104  Resp:  20 22 15   Temp: 98.7 F (37.1 C)   (!) 102.3 F (39.1 C)  TempSrc: Axillary   Axillary  SpO2:  90% 93% 91%  Weight:      Height:        Intake/Output Summary (Last 24 hours) at 08/01/2019 0938 Last data filed at 08/01/2019 0800 Gross per 24 hour  Intake 1671.82 ml  Output 1420 ml  Net 251.82 ml   Filed Weights   07/23/19 2152 07/24/19 0552 07/26/19 1324  Weight: 72.6 kg 74.9 kg 84.7 kg    Examination:  General exam: alert and comfortable.  Respiratory system: diminished air entry at bases, no wheezing or rhonchi, no tachypnea.  Cardiovascular system: S1-S2 heard, tachycardic, no JVD Gastrointestinal system: Abdomen is soft, with mild generalized tenderness, no signs of peritonitis, bowel sounds within normal limits. Central nervous system: Alert, oriented, grossly nonfocal Extremities: No cyanosis or clubbing Skin: No rashes seen Psychiatry: Flat affect   Data Reviewed: I have personally reviewed following labs and imaging studies  CBC: Recent Labs  Lab 07/26/19 0615 07/29/19 0151 07/30/19 0211 07/31/19 1500 08/01/19 0207  WBC 3.8* 5.9 11.3* 7.7 9.4  NEUTROABS  --   --   --  5.9  --   HGB 9.9* 9.8*  10.3* 10.1* 9.8*  HCT 28.6* 29.7* 30.8* 30.8* 29.0*  MCV 95.7 98.0 96.6 96.6 93.9  PLT 83* 149* 214 251 212    Basic Metabolic Panel: Recent Labs  Lab 07/28/19 0247 07/29/19 0151 07/30/19 0211 07/31/19 0201 08/01/19 0207 08/01/19 0425  NA 136 136 134* 137 134*  --   K 3.3* 3.6 3.2* 3.1* 3.7  --   CL 101 101 94* 95* 95*  --   CO2 --   GLUCOSE 82 81 124* 119* 75  --   BUN <5* <5*  --   CREATININE 0.56* 0.56* 0.58* 0.66 0.59*  --   CALCIUM 7.6* 7.8* 7.7* 7.6* 7.5*  --   MG 2.0 1.8 1.9 1.6*  --  1.7  PHOS 3.8 3.9 4.5 3.1  --  3.0    GFR: Estimated  Creatinine Clearance: 116.3 mL/min (A) (by C-G formula based on SCr of 0.59 mg/dL (L)).  Liver Function Tests: Recent Labs  Lab 07/27/19 0215 07/28/19 0247 07/30/19 0211 07/31/19 0201 08/01/19 0207  AST 49* 37 70* 124* 50*  ALT 44 39 53* 120* 76*  ALKPHOS 52 60 92 132* 102  BILITOT 1.0 0.8 0.9 0.8 0.6  PROT 5.5* 5.2* 4.9* 4.6* 5.0*  ALBUMIN 2.6* 2.3* 2.1* 2.0* 1.9*    CBG: No results for input(s): GLUCAP in the last 168 hours.   Recent Results (from the past 240 hour(s))  SARS Coronavirus 2 by RT PCR (hospital order, performed in [facility] hospital lab) Nasopharyngeal Nasopharyngeal Swab     Status: None   Collection Time: 07/24/19 12:08 AM   Specimen: Nasopharyngeal Swab  Result Value Ref Range Status   SARS Coronavirus 2 NEGATIVE NEGATIVE Final    Comment: (NOTE) SARS-CoV-2 target nucleic acids are NOT DETECTED.  The SARS-CoV-2 RNA is generally detectable in upper and lower respiratory specimens during the acute phase of infection. The lowest concentration of SARS-CoV-2 viral copies this assay can detect is 250 copies / mL. A negative result does not preclude SARS-CoV-2 infection and should not be used as the sole basis for treatment or other patient management decisions.  A negative result may occur with improper specimen collection / handling, submission of specimen other than nasopharyngeal swab, presence of viral mutation(s) within the areas targeted by this assay, and inadequate number of viral copies (<250 copies / mL). A negative result must be combined with clinical observations, patient history, and epidemiological information.  Fact Sheet for Patients:   BoilerBrush.com.cy  Fact Sheet for Healthcare Providers: https://pope.com/  This test is not yet approved or  cleared by the [place] FDA and has been authorized for detection and/or diagnosis of SARS-CoV-2 by FDA under an Emergency Use Authorization  (EUA).  This EUA will remain in effect (meaning this test can be used) for the duration of the COVID-19 declaration under Section 564(b)(1) of the Act, 21 U.S.C. section 360bbb-3(b)(1), unless the authorization is terminated or revoked sooner.  Performed at [facility], [address] [place], [place] [address]   Culture, blood (routine x 2)     Status: None   Collection Time: 07/26/19 11:20 AM   Specimen: BLOOD  Result Value Ref Range Status   Specimen Description   Final    BLOOD RIGHT ARM Performed at [facility], [address] [place], [place] [address]    Special Requests   Final    BOTTLES DRAWN AEROBIC AND ANAEROBIC Blood  Culture adequate volume Performed at [facility], [address] [place], [place] [address]    Culture   Final    NO GROWTH 5 DAYS Performed at [facility], [address] [place], [place] [address]    Report Status 07/31/2019 FINAL  Final  Culture, blood (routine x 2)     Status: None   Collection Time: 07/26/19 11:23 AM   Specimen: BLOOD  Result Value Ref Range Status   Specimen Description   Final    BLOOD BLOOD RIGHT HAND Performed at [facility], [address] [place], [place] [address]    Special Requests   Final    BOTTLES DRAWN AEROBIC ONLY Performed at [facility], [address] [place], [place] [address]    Culture   Final    NO GROWTH 5 DAYS Performed at [facility], [address] [place], [place] [address]    Report Status 07/31/2019 FINAL  Final  MRSA PCR Screening     Status: None   Collection Time: 07/27/19  8:20 AM   Specimen: Nasal Mucosa; Nasopharyngeal  Result Value Ref Range Status   MRSA by PCR NEGATIVE NEGATIVE Final    Comment:        The GeneXpert MRSA Assay (FDA approved for NASAL specimens only), is one component of a comprehensive MRSA colonization surveillance program. It is not intended to  diagnose MRSA infection nor to guide or monitor treatment for MRSA infections. Performed at [facility], [address] [place], [place] [address]          Radiology Studies: CT ABDOMEN PELVIS W CONTRAST  Result Date: 07/31/2019 CLINICAL DATA:  Follow-up of pancreatitis.  Abdominal pain. EXAM: CT ABDOMEN AND PELVIS WITH CONTRAST TECHNIQUE: Multidetector CT imaging of the abdomen and pelvis was performed using the standard protocol following bolus administration of intravenous contrast. CONTRAST:  100mL OMNIPAQUE IOHEXOL 300 MG/ML  SOLN COMPARISON:  07/23/2019 FINDINGS: Lower chest: Motion degradation. New bibasilar airspace disease. Borderline cardiomegaly within LAD stent. Left circumflex coronary artery atherosclerosis. Small bilateral pleural effusions are unchanged. Hepatobiliary: Moderate hepatic steatosis, without focal liver lesion. Dependent hyperattenuation in the gallbladder could represent sludge. No specific evidence of acute cholecystitis. Common duct difficult to follow. No evidence of biliary duct dilatation. Pancreas: Moderate peripancreatic fluid is increased. Areas of hypoenhancement, suspicious for necrosis identified within the head/uncinate process on 50/2 and tail on 39/2. These are similar in distribution to on the prior, slightly more well-defined today. Moderate peripancreatic fluid, most well-defined adjacent the tail, including at 7.3 x 6.4 cm on 38/2. Spleen: Normal in size, without focal abnormality. Adrenals/Urinary Tract: Normal adrenal glands. Normal kidneys, without hydronephrosis. Normal urinary bladder. Stomach/Bowel: Normal stomach, without wall thickening. The splenic flexure and descending colon are mildly thick walled, including on 44/2. Suspect right-sided colonic wall thickening on 52/2. Normal small bowel caliber. Vascular/Lymphatic: Aortic atherosclerosis. Patent portal and splenic veins. There are gastroepiploic collaterals, suggesting  splenic vein insufficiency. Example 47/2. No abdominopelvic adenopathy. Reproductive: Normal prostate. Other: Intraperitoneal fluid is small volume, slightly increased. Minimal cul-de-sac fluid is similar. No free intraperitoneal air. Development of anasarca. Musculoskeletal: No acute osseous abnormality. IMPRESSION: 1. Since 07/23/2019, increase in moderate peripancreatic fluid, consistent with acute pancreatitis. Areas of hypoenhancement within the pancreatic head/uncinate process are consistent with a involving necrosis. Well-defined fluid adjacent the pancreatic tail, for which developing pseudocyst cannot be excluded. 2. Increase in small volume abdominal pelvic ascites and anasarca.  3. New bibasilar airspace disease, favoring atelectasis. 4. Similar small bilateral pleural effusions. 5. Hepatic steatosis. 6. Left and possible right-sided colonic wall thickening indicative of colitis. This could be secondary to peripancreatic inflammation. 7. 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 No acute portal or splenic vein thrombus. 8. Aortic Atherosclerosis (ICD10-I70.0). Electronically Signed   By: Jeronimo Greaves M.D.   On: 07/31/2019 12:30   ECHOCARDIOGRAM COMPLETE  Result Date: 08/01/2019    ECHOCARDIOGRAM REPORT   Patient Name:   ZYRON DEELEY Date of Exam: 08/01/2019 Medical Rec #:  [id number]    Height:       71.0 in Accession #:    [id number]   Weight:       186.7 lb Date of Birth:  10-30-1970    BSA:          2.048 m Patient Age:    49 years     BP:           157/72 mmHg Patient Gender: M            HR:           98 bpm. Exam Location:  Inpatient Procedure: 2D Echo Indications:    Dyspnea R06.00  History:        Patient has prior history of Echocardiogram examinations, most                 recent 08/25/2013. CAD and Previous Myocardial Infarction; Risk                 Factors:Hypertension and Dyslipidemia.  Sonographer:    Thurman Coyer RDCS (AE) Referring Phys: Oliver Hum Nichola Cieslinski  IMPRESSIONS  1. Left ventricular ejection fraction, by estimation, is 60 to 65%. The left ventricle has normal function. The left ventricle has no regional wall motion abnormalities. Left ventricular diastolic parameters were normal.  2. Right ventricular systolic function is normal. The right ventricular size is normal. Tricuspid regurgitation signal is inadequate for assessing PA pressure.  3. The mitral valve is normal in structure. No evidence of mitral valve regurgitation. No evidence of mitral stenosis.  4. The aortic valve is normal in structure. Aortic valve regurgitation is mild. No aortic stenosis is present.  5. The inferior vena cava is normal in size with greater than 50% respiratory variability, suggesting right atrial pressure of 3 mmHg. FINDINGS  Left Ventricle: Left ventricular ejection fraction, by estimation, is 60 to 65%. The left ventricle has normal function. The left ventricle has no regional wall motion abnormalities. The left ventricular internal cavity size was normal in size. There is  no left ventricular hypertrophy. Left ventricular diastolic parameters were normal. Normal left ventricular filling pressure. Right Ventricle: The right ventricular size is normal. No increase in right ventricular wall thickness. Right ventricular systolic function is normal. Tricuspid regurgitation signal is inadequate for assessing PA pressure. Left Atrium: Left atrial size was normal in size. Right Atrium: Right atrial size was normal in size. Pericardium: There is no evidence of pericardial effusion. Mitral Valve: The mitral valve is normal in structure. Normal mobility of the mitral valve leaflets. No evidence of mitral valve regurgitation. No evidence of mitral valve stenosis. Tricuspid Valve: The tricuspid valve is normal in structure. Tricuspid valve regurgitation is trivial. No evidence of tricuspid stenosis. Aortic Valve: The aortic valve is normal in structure. Aortic valve regurgitation is mild.  No aortic stenosis is present. Pulmonic Valve: The pulmonic valve was normal in structure. Pulmonic valve regurgitation is not visualized. No  evidence of pulmonic stenosis. Aorta: The aortic root is normal in size and structure. Venous: The inferior vena cava is normal in size with greater than 50% respiratory variability, suggesting right atrial pressure of 3 mmHg. IAS/Shunts: No atrial level shunt detected by color flow Doppler.  LEFT VENTRICLE PLAX 2D LVIDd:         5.00 cm  Diastology LVIDs:         3.20 cm  LV e' lateral:   13.50 cm/s LV PW:         1.10 cm  LV E/e' lateral: 8.1 LV IVS:        0.90 cm  LV e' medial:    8.38 cm/s LVOT diam:     2.10 cm  LV E/e' medial:  13.0 LV SV:         95 LV SV Index:   46 LVOT Area:     3.46 cm  RIGHT VENTRICLE RV S prime:     17.10 cm/s TAPSE (M-mode): 2.3 cm LEFT ATRIUM             Index       RIGHT ATRIUM           Index LA diam:        3.70 cm 1.81 cm/m  RA Area:     14.60 cm LA Vol (A2C):   47.9 ml 23.39 ml/m RA Volume:   31.70 ml  15.48 ml/m LA Vol (A4C):   39.9 ml 19.48 ml/m LA Biplane Vol: 44.7 ml 21.83 ml/m  AORTIC VALVE LVOT Vmax:   161.00 cm/s LVOT Vmean:  98.200 cm/s LVOT VTI:    0.274 m  AORTA Ao Root diam: 3.60 cm MITRAL VALVE MV Area (PHT): 3.16 cm     SHUNTS MV Decel Time: 240 msec     Systemic VTI:  0.27 m MV E velocity: 109.00 cm/s  Systemic Diam: 2.10 cm MV A velocity: 87.70 cm/s MV E/A ratio:  1.24 Armanda Magic MD Electronically signed by Armanda Magic MD Signature Date/Time: 08/01/2019/9:28:17 AM    Final    VAS [place] UPPER EXTREMITY VENOUS DUPLEX  Result Date: 07/31/2019 UPPER VENOUS STUDY  Indications: Pain and swelling in upper arm. Comparison Study: No prior studies. Performing Technologist: Jean Rosenthal  Examination Guidelines: A complete evaluation includes B-mode imaging, spectral Doppler, color Doppler, and power Doppler as needed of all accessible portions of each vessel. Bilateral testing is considered an integral part of a complete  examination. Limited examinations for reoccurring indications may be performed as noted.  Right Findings: +----------+------------+---------+-----------+----------+-------+ RIGHT     CompressiblePhasicitySpontaneousPropertiesSummary +----------+------------+---------+-----------+----------+-------+ Subclavian    Full       Yes       Yes                      +----------+------------+---------+-----------+----------+-------+  Left Findings: +----------+------------+---------+-----------+----------+-------+ LEFT      CompressiblePhasicitySpontaneousPropertiesSummary +----------+------------+---------+-----------+----------+-------+ IJV           Full       Yes       Yes                      +----------+------------+---------+-----------+----------+-------+ Subclavian    Full       Yes       Yes                      +----------+------------+---------+-----------+----------+-------+ Axillary      Full  Yes       Yes                      +----------+------------+---------+-----------+----------+-------+ Brachial      Full                                          +----------+------------+---------+-----------+----------+-------+ Radial        Full                                          +----------+------------+---------+-----------+----------+-------+ Ulnar         Full                                          +----------+------------+---------+-----------+----------+-------+ Cephalic    Partial                                  Acute  +----------+------------+---------+-----------+----------+-------+ Basilic       Full                                          +----------+------------+---------+-----------+----------+-------+  Summary:  Right: No evidence of thrombosis in the subclavian.  Left: No evidence of deep vein thrombosis in the upper extremity. Findings consistent with acute superficial vein thrombosis involving the left cephalic  vein.  *See table(s) above for measurements and observations.  Diagnosing physician: Sherald Hess MD Electronically signed by Sherald Hess MD on 07/31/2019 at 3:46:14 PM.    Final         Scheduled Meds: . busPIRone  20 mg Oral BID  . Chlorhexidine Gluconate Cloth  6 each Topical Daily  . diazepam  5 mg Oral BID  . feeding supplement  1 Container Oral TID BM  . fluvoxaMINE  150 mg Oral BID  . folic acid  1 mg Oral Daily  . lisinopril  10 mg Oral Daily  . multivitamin with minerals  1 tablet Oral Daily  . pantoprazole (PROTONIX) IV  40 mg Intravenous Q24H  . propranolol  20 mg Oral Daily  . QUEtiapine  50 mg Oral QHS  . thiamine  100 mg Oral Daily   Or  . thiamine  100 mg Intravenous Daily   Continuous Infusions: . sodium chloride 100 mL/hr at 08/01/19 0800  . meropenem (MERREM) IV Stopped (08/01/19 1610)     LOS: 8 days        Kathlen Mody, MD [facility]   To contact the attending provider between 7A-7P or the covering provider during after hours 7P-7A, please log into the web site [email or web address] and access using universal [place] password for that web site. If you do not have the password, please call the hospital operator.  08/01/2019, 9:38 AM

## 2019-08-02 LAB — MAGNESIUM: Magnesium: 1.9 mg/dL (ref 1.7–2.4)

## 2019-08-02 LAB — COMPREHENSIVE METABOLIC PANEL
ALT: 63 U/L — ABNORMAL HIGH (ref 0–44)
AST: 52 U/L — ABNORMAL HIGH (ref 15–41)
Albumin: 2 g/dL — ABNORMAL LOW (ref 3.5–5.0)
Alkaline Phosphatase: 107 U/L (ref 38–126)
Anion gap: 14 (ref 5–15)
BUN: <5 mg/dL — ABNORMAL LOW (ref 6–20)
CO2: 22 mmol/L (ref 22–32)
Calcium: 7.3 mg/dL — ABNORMAL LOW (ref 8.9–10.3)
Chloride: 98 mmol/L (ref 98–111)
Creatinine, Ser: 0.54 mg/dL — ABNORMAL LOW (ref 0.61–1.24)
GFR calc Af Amer: >60 mL/min (ref >60)
GFR calc non Af Amer: >60 mL/min (ref >60)
Glucose, Bld: 94 mg/dL (ref 70–99)
Potassium: 3.2 mmol/L — ABNORMAL LOW (ref 3.5–5.1)
Sodium: 134 mmol/L — ABNORMAL LOW (ref 135–145)
Total Bilirubin: 0.7 mg/dL (ref 0.3–1.2)
Total Protein: 5.1 g/dL — ABNORMAL LOW (ref 6.5–8.1)

## 2019-08-02 LAB — LIPASE, BLOOD: Lipase: 38 U/L (ref 11–51)

## 2019-08-02 LAB — PHOSPHORUS: Phosphorus: 4.1 mg/dL (ref 2.5–4.6)

## 2019-08-02 MED ORDER — POTASSIUM CHLORIDE CRYS ER 20 MEQ PO TBCR
40.0000 meq | EXTENDED_RELEASE_TABLET | Freq: Once | ORAL | Status: AC
  Administered 2019-08-02: 40 meq via ORAL
  Filled 2019-08-02: qty 2

## 2019-08-02 MED ORDER — CLONAZEPAM 0.125 MG PO TBDP: 1.0000 mg | ORAL_TABLET | Freq: Two times a day (BID) | ORAL | Status: DC | PRN

## 2019-08-02 MED ORDER — TOPIRAMATE 25 MG PO TABS
25.0000 mg | ORAL_TABLET | Freq: Every day | ORAL | Status: DC
  Administered 2019-08-02 – 2019-08-06 (×5): 25 mg via ORAL
  Filled 2019-08-02 (×5): qty 1

## 2019-08-02 NOTE — Progress Notes (Signed)
PROGRESS NOTE    Gary Atkinson  VVO:[id number] DOB: [birth year] DOA: 07/23/2019 PCP: Patient, No Pcp Per    No chief complaint on file.   Brief Narrative:  49 year old gentleman with prior history of hypertension, alcohol use, GERD, chronically elevated liveR enzymes presents to ED for nausea vomiting and abdominal pain.  On arrival to ED was found to have a lipase of 1500, elevated AST and ALT.  CT of the abdomen was consistent with acute pancreatitis and concern for development of necrosis.  Patient was admitted by [facility] and GI was consulted.  He underwent MRCP showing acute hemorrhagic necrotizing pancreatitis involving 50% of pancreatic parenchyma with no biliary duct dilatation and no choledocholithiasis but with diffuse hepatic steatosis.  Patient was transferred to progressive unit on 7/24 due to worsening alcohol withdrawals with active delirium tremens requiring IV Precedex drip for better control.  PCCM on board and assisting with withdrawal symptoms. Pt was weaned off precedex gtt and transferred to telemetry. Hospital course complicated by fevers . He was started on Merrem on 7/29 for necrotizing pancreatitis. Marland Kitchen t max of 100.3. of today. Pt reports he is able to tolerate soft diet.   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 Improving, pt started on clear liquid diet and advanced to low fat diet.  GI consulted , recommended repeating CT abdomen and pelvis.   Repeat imaging shows , increase in moderate peripancreatic fluid,consistent with acute pancreatitis. Areas of hypoenhancement within the pancreatic head/uncinate process are consistent with a involving necrosis. Well-defined fluid adjacent the pancreatic tail, for which developing pseudocyst cannot be excluded.  continue with IV dilaudid for severe pain, and tramadol for moderate pain. Pt having nightmares and agitation with oxycodone and OxyContin.  Aggressive hydration with IV fluids and IV anti emetics.      Persistent fevers Blood cultures from 07/26/19 negative so far. Repeat cultures ordered.  t max of 102.3. started on meropenam empirically.  CXR and UA ordered to complete the work up.  CXR shows improving opacities.  UA is negative for infection.     Pulmonary vascular congestion, with bilateral pleural effusions: Probably from aggressive hydration.  Echocardiogram wnl. Currently on RA does not complain of sob or chest pain.     Alcohol abuse with alcohol withdrawal symptoms and delirium tremens. He was started on Precedex drip on 7/25 due to worsening DTs was able to wean him off on 07/29/19 PCCM on board and appreciate their assistance. Continue with CIWA protocol with Ativan, thiamine, multivitamin, folic acid.   Wheezing Appears to have resolved with IV Lasix.  Nasal cannula oxygen as needed.  Continue with duo nebs as needed.     Hypomagnesemia hypokalemia hypophosphatemia and hypocalcemia Replaced, repeat levels wnl.    Pancytopenia Probably secondary to bone marrow suppression from alcohol abuse.    Hyponatremia probably from fluid losses reactionary to acute pancreatitis superimposed on chronic alcohol abuse. Improved, sodium of 134 today.     Transaminitis Probably secondary  to hepatic steatosis and /or  alcoholic hepatitis.improving ALT IS 63 and AST IS 52.   Essential hypertension Well controlled.    Substance abuse disorder Psychiatric  consulted and recommended starting the patient on Seroquel 50 mg at bedtime. No suicidal or homicidal ideations.    Elevated troponins probably secondary to demand ischemia. Pt denies any chest pain.   Coronary artery disease s/p DES Currently off aspirin due to hemorrhagic pancreatic necrosis.    Acute thrombocytopenia  Resolved.    Anemia of acute illness Probably from acute pancreatitis.  Hemoglobin 13.8 >12.8>9.9>9.8 since admission.  Anemia panel shows low iron levels, will need supplementation on discharge .   PT evaluation recommending SNF. toc consult in place.   DVT prophylaxis: SCD'S Code Status: full code.  Family Communication: none at bedside.  Disposition:   Status is: Inpatient  Remains inpatient appropriate because:IV treatments appropriate due to intensity of illness or inability to take PO   Dispo: The patient is from: Home              Anticipated d/c is to: SNF              Anticipated d/c date is: 1 day              Patient currently is not medically stable to d/c.       Consultants:   PSYCHIATRY  GI  PCCM  Procedures:  Ct abdomen and pelvis  Since 07/23/2019, increase in moderate peripancreatic fluid, consistent with acute pancreatitis. Areas of hypoenhancement within the pancreatic head/uncinate process are consistent with a involving necrosis. Well-defined fluid adjacent the pancreatic tail, for which developing pseudocyst cannot be excluded. 2. Increase in small volume abdominal pelvic ascites and anasarca. 3. New bibasilar airspace disease, favoring atelectasis. 4. Similar small bilateral pleural effusions. 5. Hepatic steatosis. 6. Left and possible right-sided colonic wall thickening indicative of colitis. This could be secondary to peripancreatic inflammation. 7. 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 No acute portal or splenic vein thrombus. 8. Aortic Atherosclerosis  (ICD10-I70.0).  Antimicrobials: Anti-infectives (From admission, onward)   Start     Dose/Rate Route Frequency Ordered Stop   07/31/19 1500  meropenem (MERREM) 1 g in sodium chloride 0.9 % 100 mL IVPB     Discontinue     1 g 200 mL/hr over 30 Minutes Intravenous Every 8 hours 07/31/19 1441        Subjective: abd pain improving, no nausea, or vomiting.   Objective: Vitals:   08/02/19 0446 08/02/19 0815 08/02/19 0926 08/02/19 1356  BP: (!) 139/76 (!) 139/76 (!) 136/80 (!) 155/92  Pulse: 75 78 83 74  Resp: Temp: 100.2 F (37.9 C) 98.8 F (37.1 C) 99.4 F (37.4 C) 99.8 F (37.7 C)  TempSrc:  Oral Oral Oral  SpO2: 96% 95% 99% 96%  Weight:      Height:        Intake/Output Summary (Last 24 hours) at 08/02/2019 1443 Last data filed at 08/02/2019 0932 Gross per 24 hour  Intake 2571.7 ml  Output 1875 ml  Net 696.7 ml   Filed Weights   07/24/19 0552 07/26/19 1324 08/01/19 2205  Weight: 74.9 kg 84.7 kg 85.4 kg    Examination:  General exam: alert and comfortable.  Respiratory system: Diminished air entry at bases. No wheezing or rhonchi.  Cardiovascular system: S1S2, RRR, no JVD, no pedal edema.  Gastrointestinal system: Abdomen is soft, mildly tender generalized, bowel sounds wnl. Central  nervous system: Alert and oriented , grossly wnl.  Extremities: no cyanosis or clubbing.  Skin: No rashes seen. Psychiatry: mood appropriate.    Data Reviewed: I have personally reviewed following labs and imaging studies  CBC: Recent Labs  Lab 07/29/19 0151 07/30/19 0211 07/31/19 1500 08/01/19 0207  WBC 5.9 11.3* 7.7 9.4  NEUTROABS  --   --  5.9  --   HGB 9.8* 10.3* 10.1* 9.8*  HCT 29.7* 30.8* 30.8* 29.0*  MCV 98.0 96.6 96.6 93.9  PLT 149* 214 251 212    Basic Metabolic Panel: Recent Labs  Lab 07/29/19 0151 07/30/19 0211 07/31/19 0201 08/01/19 0207 08/01/19 0425 08/02/19 0354  NA 136 134* 137 134*  --  134*  K 3.6 3.2* 3.1* 3.7  --  3.2*  CL 101  94* 95* 95*  --  98  CO2 --  22  GLUCOSE 81 124* 119* 75  --  94  BUN 12 7 <5* <5*  --  <5*  CREATININE 0.56* 0.58* 0.66 0.59*  --  0.54*  CALCIUM 7.8* 7.7* 7.6* 7.5*  --  7.3*  MG 1.8 1.9 1.6*  --  1.7 1.9  PHOS 3.9 4.5 3.1  --  3.0 4.1    GFR: Estimated Creatinine Clearance: 116.3 mL/min (A) (by C-G formula based on SCr of 0.54 mg/dL (L)).  Liver Function Tests: Recent Labs  Lab 07/28/19 0247 07/30/19 0211 07/31/19 0201 08/01/19 0207 08/02/19 0354  AST 37 70* 124* 50* 52*  ALT 39 53* 120* 76* 63*  ALKPHOS 60 92 132* 102 107  BILITOT 0.8 0.9 0.8 0.6 0.7  PROT 5.2* 4.9* 4.6* 5.0* 5.1*  ALBUMIN 2.3* 2.1* 2.0* 1.9* 2.0*    CBG: No results for input(s): GLUCAP in the last 168 hours.   Recent Results (from the past 240 hour(s))  SARS Coronavirus 2 by RT PCR (hospital order, performed in [facility] hospital lab) Nasopharyngeal Nasopharyngeal Swab     Status: None   Collection Time: 07/24/19 12:08 AM   Specimen: Nasopharyngeal Swab  Result Value Ref Range Status   SARS Coronavirus 2 NEGATIVE NEGATIVE Final    Comment: (NOTE) SARS-CoV-2 target nucleic acids are NOT DETECTED.  The SARS-CoV-2 RNA is generally detectable in upper and lower respiratory specimens during the acute phase of infection. The lowest concentration of SARS-CoV-2 viral copies this assay can detect is 250 copies / mL. A negative result does not preclude SARS-CoV-2 infection and should not be used as the sole basis for treatment or other patient management decisions.  A negative result may occur with improper specimen collection / handling, submission of specimen other than nasopharyngeal swab, presence of viral mutation(s) within the areas targeted by this assay, and inadequate number of viral copies (<250 copies / mL). A negative result must be combined with clinical observations, patient history, and epidemiological information.  Fact Sheet for Patients:    BoilerBrush.com.cy  Fact Sheet for Healthcare Providers: https://pope.com/  This test is not yet approved or  cleared by the [place] FDA and has been authorized for detection and/or diagnosis of SARS-CoV-2 by FDA under an Emergency Use Authorization (EUA).  This EUA will remain in effect (meaning this test can be used) for the duration of the COVID-19 declaration under Section 564(b)(1) of the Act, 21 U.S.C. section 360bbb-3(b)(1), unless the authorization is terminated or revoked sooner.  Performed at [facility], [address] [place], [place] [address]   Culture, blood (routine  x 2)     Status: None   Collection Time: 07/26/19 11:20 AM   Specimen: BLOOD  Result Value Ref Range Status   Specimen Description   Final    BLOOD RIGHT ARM Performed at [facility], [address] [place], [place] [address]    Special Requests   Final    BOTTLES DRAWN AEROBIC AND ANAEROBIC Blood Culture adequate volume Performed at [facility], [address] [place], [place] [address]    Culture   Final    NO GROWTH 5 DAYS Performed at [facility], [address], [place], [place] [address]    Report Status 07/31/2019 FINAL  Final  Culture, blood (routine x 2)     Status: None   Collection Time: 07/26/19 11:23 AM   Specimen: BLOOD  Result Value Ref Range Status   Specimen Description   Final    BLOOD BLOOD RIGHT HAND Performed at [facility], [address] [place], [place] [address]    Special Requests   Final    BOTTLES DRAWN AEROBIC ONLY Performed at [facility], [address] [place], [place] [address]    Culture   Final    NO GROWTH 5 DAYS Performed at [facility], [address], [place], [place] [address]    Report Status 07/31/2019 FINAL  Final  MRSA PCR Screening     Status: None   Collection Time: 07/27/19  8:20  AM   Specimen: Nasal Mucosa; Nasopharyngeal  Result Value Ref Range Status   MRSA by PCR NEGATIVE NEGATIVE Final    Comment:        The GeneXpert MRSA Assay (FDA approved for NASAL specimens only), is one component of a comprehensive MRSA colonization surveillance program. It is not intended to diagnose MRSA infection nor to guide or monitor treatment for MRSA infections. Performed at [facility], [address], [place], [place] [address]   Culture, blood (Routine X 2) w Reflex to ID Panel     Status: None (Preliminary result)   Collection Time: 08/01/19 11:31 AM   Specimen: BLOOD RIGHT HAND  Result Value Ref Range Status   Specimen Description   Final    BLOOD RIGHT HAND Performed at [facility], [address], [place], [place] [address]    Special Requests   Final    BOTTLES DRAWN AEROBIC ONLY Blood Culture adequate volume Performed at [facility], [address] [place], [place] [address]    Culture   Final    NO GROWTH < 24 HOURS Performed at [facility], [address], [place], [place] [address]    Report Status PENDING  Incomplete  Culture, blood (Routine X 2) w Reflex to ID Panel     Status: None (Preliminary result)   Collection Time: 08/01/19 11:40 AM   Specimen: BLOOD LEFT HAND  Result Value Ref Range Status   Specimen Description   Final    BLOOD LEFT HAND Performed at [facility], [address], [place], [place] [address]    Special Requests   Final    BOTTLES DRAWN AEROBIC AND ANAEROBIC Blood Culture adequate volume Performed at [facility], [address] [place], [place] [address]    Culture   Final    NO GROWTH < 24 HOURS Performed at [facility], [address], [place], [place] [address]  Report Status PENDING  Incomplete         Radiology Studies: DG Chest 2 View  Result Date: 08/01/2019 CLINICAL DATA:  Fever EXAM:  CHEST - 2 VIEW COMPARISON:  07/29/2019 FINDINGS: Heart is normal size. Bilateral lower lobe airspace opacities are noted, slightly improved since prior study. No visible effusions or pneumothorax. No acute bony abnormality. IMPRESSION: Persistent but improving bilateral lower lobe airspace opacities. Electronically Signed   By: Charlett NoseKevin  Dover M.D.   On: 08/01/2019 11:22   ECHOCARDIOGRAM COMPLETE  Result Date: 08/01/2019    ECHOCARDIOGRAM REPORT   Patient Name:   Orlin HildingODD Bundren Date of Exam: 08/01/2019 Medical Rec #:  [id number]    Height:       71.0 in Accession #:    [id number]   Weight:       186.7 lb Date of Birth:  10/16/1970    BSA:          2.048 m Patient Age:    49 years     BP:           157/72 mmHg Patient Gender: M            HR:           98 bpm. Exam Location:  Inpatient Procedure: 2D Echo Indications:    Dyspnea R06.00  History:        Patient has prior history of Echocardiogram examinations, most                 recent 08/25/2013. CAD and Previous Myocardial Infarction; Risk                 Factors:Hypertension and Dyslipidemia.  Sonographer:    Thurman Coyerasey Kirkpatrick RDCS (AE) Referring Phys: [id number] Halo Shevlin IMPRESSIONS  1. Left ventricular ejection fraction, by estimation, is 60 to 65%. The left ventricle has normal function. The left ventricle has no regional wall motion abnormalities. Left ventricular diastolic parameters were normal.  2. Right ventricular systolic function is normal. The right ventricular size is normal. Tricuspid regurgitation signal is inadequate for assessing PA pressure.  3. The mitral valve is normal in structure. No evidence of mitral valve regurgitation. No evidence of mitral stenosis.  4. The aortic valve is normal in structure. Aortic valve regurgitation is mild. No aortic stenosis is present.  5. The inferior vena cava is normal in size with greater than 50% respiratory variability, suggesting right atrial pressure of 3 mmHg. FINDINGS  Left Ventricle: Left ventricular  ejection fraction, by estimation, is 60 to 65%. The left ventricle has normal function. The left ventricle has no regional wall motion abnormalities. The left ventricular internal cavity size was normal in size. There is  no left ventricular hypertrophy. Left ventricular diastolic parameters were normal. Normal left ventricular filling pressure. Right Ventricle: The right ventricular size is normal. No increase in right ventricular wall thickness. Right ventricular systolic function is normal. Tricuspid regurgitation signal is inadequate for assessing PA pressure. Left Atrium: Left atrial size was normal in size. Right Atrium: Right atrial size was normal in size. Pericardium: There is no evidence of pericardial effusion. Mitral Valve: The mitral valve is normal in structure. Normal mobility of the mitral valve leaflets. No evidence of mitral valve regurgitation. No evidence of mitral valve stenosis. Tricuspid Valve: The tricuspid valve is normal in structure. Tricuspid valve regurgitation is trivial. No evidence of tricuspid stenosis. Aortic Valve: The aortic valve is normal in structure. Aortic valve regurgitation is mild. No aortic stenosis is  present. Pulmonic Valve: The pulmonic valve was normal in structure. Pulmonic valve regurgitation is not visualized. No evidence of pulmonic stenosis. Aorta: The aortic root is normal in size and structure. Venous: The inferior vena cava is normal in size with greater than 50% respiratory variability, suggesting right atrial pressure of 3 mmHg. IAS/Shunts: No atrial level shunt detected by color flow Doppler.  LEFT VENTRICLE PLAX 2D LVIDd:         5.00 cm  Diastology LVIDs:         3.20 cm  LV e' lateral:   13.50 cm/s LV PW:         1.10 cm  LV E/e' lateral: 8.1 LV IVS:        0.90 cm  LV e' medial:    8.38 cm/s LVOT diam:     2.10 cm  LV E/e' medial:  13.0 LV SV:         95 LV SV Index:   46 LVOT Area:     3.46 cm  RIGHT VENTRICLE RV S prime:     17.10 cm/s TAPSE  (M-mode): 2.3 cm LEFT ATRIUM             Index       RIGHT ATRIUM           Index LA diam:        3.70 cm 1.81 cm/m  RA Area:     14.60 cm LA Vol (A2C):   47.9 ml 23.39 ml/m RA Volume:   31.70 ml  15.48 ml/m LA Vol (A4C):   39.9 ml 19.48 ml/m LA Biplane Vol: 44.7 ml 21.83 ml/m  AORTIC VALVE LVOT Vmax:   161.00 cm/s LVOT Vmean:  98.200 cm/s LVOT VTI:    0.274 m  AORTA Ao Root diam: 3.60 cm MITRAL VALVE MV Area (PHT): 3.16 cm     SHUNTS MV Decel Time: 240 msec     Systemic VTI:  0.27 m MV E velocity: 109.00 cm/s  Systemic Diam: 2.10 cm MV A velocity: 87.70 cm/s MV E/A ratio:  1.24 Armanda Magic MD Electronically signed by Armanda Magic MD Signature Date/Time: 08/01/2019/9:28:17 AM    Final         Scheduled Meds: . busPIRone  20 mg Oral BID  . diazepam  5 mg Oral BID  . feeding supplement  1 Container Oral TID BM  . fluvoxaMINE  150 mg Oral BID  . folic acid  1 mg Oral Daily  . lisinopril  10 mg Oral Daily  . multivitamin with minerals  1 tablet Oral Daily  . pantoprazole (PROTONIX) IV  40 mg Intravenous Q24H  . propranolol  20 mg Oral Daily  . QUEtiapine  50 mg Oral QHS  . thiamine  100 mg Oral Daily   Or  . thiamine  100 mg Intravenous Daily  . topiramate  25 mg Oral Daily   Continuous Infusions: . sodium chloride 100 mL/hr at 08/02/19 0830  . meropenem (MERREM) IV 1 g (08/02/19 0615)     LOS: 9 days        Kathlen Mody, MD [facility]   To contact the attending provider between 7A-7P or the covering provider during after hours 7P-7A, please log into the web site [email or web address] and access using universal [place] password for that web site. If you do not have the password, please call the hospital operator.  08/02/2019, 2:43 PM

## 2019-08-02 NOTE — Progress Notes (Signed)
[facility] ROUNDING NOTE   Subjective: Patient transferred out of ICU.  Tolerated p.o. intake yesterday without nausea/vomiting or exacerbation of abdominal pain.  Pain overall he reports much better today.  Having BMs.  T-max 101.1 last evening, 100.2 this morning.   Objective: Vital signs in last 24 hours: Temp:  [98.3 F (36.8 C)-101.1 F (38.4 C)] 98.8 F (37.1 C) (07/31 0815) Pulse Rate:  [64-86] 78 (07/31 0815) Resp:  [15-24] 18 (07/31 0815) BP: (118-191)/(59-91) 139/76 (07/31 0815) SpO2:  [95 %-98 %] 95 % (07/31 0815) Weight:  [85.4 kg] 85.4 kg (07/30 2205) Last BM Date: 08/01/19 General: NAD Lungs: CTA b/l, no w/r/r Heart: SEM, regular rate Abdomen: Soft, minimal TTP without rebound or guarding.  No peritoneal signs.  No Cullins or Venita LickGray Turner sign.  ND, +BS Ext:  No c/c/e    Intake/Output from previous day: 07/30 0701 - 07/31 0700 In: 2407.5 [P.O.:237; I.V.:1871.1; IV Piggyback:299.3] Out: 3300 [Urine:3300] Intake/Output this shift: No intake/output data recorded.   Lab Results: Recent Labs    07/31/19 1500 08/01/19 0207  WBC 7.7 9.4  HGB 10.1* 9.8*  PLT 251 212  MCV 96.6 93.9   BMET Recent Labs    07/31/19 0201 08/01/19 0207  NA 137 134*  K 3.1* 3.7  CL 95* 95*  CO2 31 25  GLUCOSE 119* 75  BUN <5* <5*  CREATININE 0.66 0.59*  CALCIUM 7.6* 7.5*   LFT Recent Labs    07/31/19 0201 08/01/19 0207  PROT 4.6* 5.0*  ALBUMIN 2.0* 1.9*  AST 124* 50*  ALT 120* 76*  ALKPHOS 132* 102  BILITOT 0.8 0.6   PT/INR No results for input(s): INR in the last 72 hours.    Imaging/Other results: DG Chest 2 View  Result Date: 08/01/2019 CLINICAL DATA:  Fever EXAM: CHEST - 2 VIEW COMPARISON:  07/29/2019 FINDINGS: Heart is normal size. Bilateral lower lobe airspace opacities are noted, slightly improved since prior study. No visible effusions or pneumothorax. No acute bony abnormality. IMPRESSION: Persistent but improving bilateral lower  lobe airspace opacities. Electronically Signed   By: Charlett NoseKevin  Dover M.D.   On: 08/01/2019 11:22   CT ABDOMEN PELVIS W CONTRAST  Result Date: 07/31/2019 CLINICAL DATA:  Follow-up of pancreatitis.  Abdominal pain. EXAM: CT ABDOMEN AND PELVIS WITH CONTRAST TECHNIQUE: Multidetector CT imaging of the abdomen and pelvis was performed using the standard protocol following bolus administration of intravenous contrast. CONTRAST:  100mL OMNIPAQUE IOHEXOL 300 MG/ML  SOLN COMPARISON:  07/23/2019 FINDINGS: Lower chest: Motion degradation. New bibasilar airspace disease. Borderline cardiomegaly within LAD stent. Left circumflex coronary artery atherosclerosis. Small bilateral pleural effusions are unchanged. Hepatobiliary: Moderate hepatic steatosis, without focal liver lesion. Dependent hyperattenuation in the gallbladder could represent sludge. No specific evidence of acute cholecystitis. Common duct difficult to follow. No evidence of biliary duct dilatation. Pancreas: Moderate peripancreatic fluid is increased. Areas of hypoenhancement, suspicious for necrosis identified within the head/uncinate process on 50/2 and tail on 39/2. These are similar in distribution to on the prior, slightly more well-defined today. Moderate peripancreatic fluid, most well-defined adjacent the tail, including at 7.3 x 6.4 cm on 38/2. Spleen: Normal in size, without focal abnormality. Adrenals/Urinary Tract: Normal adrenal glands. Normal kidneys, without hydronephrosis. Normal urinary bladder. Stomach/Bowel: Normal stomach, without wall thickening. The splenic flexure and descending colon are mildly thick walled, including on 44/2. Suspect right-sided colonic wall thickening on 52/2. Normal small bowel caliber. Vascular/Lymphatic: Aortic atherosclerosis. Patent portal and splenic veins. There are gastroepiploic collaterals, suggesting  splenic vein insufficiency. Example 47/2. No abdominopelvic adenopathy. Reproductive: Normal prostate. Other:  Intraperitoneal fluid is small volume, slightly increased. Minimal cul-de-sac fluid is similar. No free intraperitoneal air. Development of anasarca. Musculoskeletal: No acute osseous abnormality. IMPRESSION: 1. Since 07/23/2019, increase in moderate peripancreatic fluid, consistent with acute pancreatitis. Areas of hypoenhancement within the pancreatic head/uncinate process are consistent with a involving necrosis. Well-defined fluid adjacent the pancreatic tail, for which developing pseudocyst cannot be excluded. 2. Increase in small volume abdominal pelvic ascites and anasarca. 3. New bibasilar airspace disease, favoring atelectasis. 4. Similar small bilateral pleural effusions. 5. Hepatic steatosis. 6. Left and possible right-sided colonic wall thickening indicative of colitis. This could be secondary to peripancreatic inflammation. 7. 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 No acute portal or splenic vein thrombus. 8. Aortic Atherosclerosis (ICD10-I70.0). Electronically Signed   By: Jeronimo Greaves M.D.   On: 07/31/2019 12:30   ECHOCARDIOGRAM COMPLETE  Result Date: 08/01/2019    ECHOCARDIOGRAM REPORT   Patient Name:   Gary Atkinson Date of Exam: 08/01/2019 Medical Rec #:  [id number]    Height:       71.0 in Accession #:    [id number]   Weight:       186.7 lb Date of Birth:  1970-08-02    BSA:          2.048 m Patient Age:    49 years     BP:           157/72 mmHg Patient Gender: M            HR:           98 bpm. Exam Location:  Inpatient Procedure: 2D Echo Indications:    Dyspnea R06.00  History:        Patient has prior history of Echocardiogram examinations, most                 recent 08/25/2013. CAD and Previous Myocardial Infarction; Risk                 Factors:Hypertension and Dyslipidemia.  Sonographer:    Thurman Coyer RDCS (AE) Referring Phys: Oliver Hum VIJAYA AKULA IMPRESSIONS  1. Left ventricular ejection fraction, by estimation, is 60 to 65%. The left ventricle has normal  function. The left ventricle has no regional wall motion abnormalities. Left ventricular diastolic parameters were normal.  2. Right ventricular systolic function is normal. The right ventricular size is normal. Tricuspid regurgitation signal is inadequate for assessing PA pressure.  3. The mitral valve is normal in structure. No evidence of mitral valve regurgitation. No evidence of mitral stenosis.  4. The aortic valve is normal in structure. Aortic valve regurgitation is mild. No aortic stenosis is present.  5. The inferior vena cava is normal in size with greater than 50% respiratory variability, suggesting right atrial pressure of 3 mmHg. FINDINGS  Left Ventricle: Left ventricular ejection fraction, by estimation, is 60 to 65%. The left ventricle has normal function. The left ventricle has no regional wall motion abnormalities. The left ventricular internal cavity size was normal in size. There is  no left ventricular hypertrophy. Left ventricular diastolic parameters were normal. Normal left ventricular filling pressure. Right Ventricle: The right ventricular size is normal. No increase in right ventricular wall thickness. Right ventricular systolic function is normal. Tricuspid regurgitation signal is inadequate for assessing PA pressure. Left Atrium: Left atrial size was normal in size. Right Atrium: Right atrial size was normal in size. Pericardium: There is no  evidence of pericardial effusion. Mitral Valve: The mitral valve is normal in structure. Normal mobility of the mitral valve leaflets. No evidence of mitral valve regurgitation. No evidence of mitral valve stenosis. Tricuspid Valve: The tricuspid valve is normal in structure. Tricuspid valve regurgitation is trivial. No evidence of tricuspid stenosis. Aortic Valve: The aortic valve is normal in structure. Aortic valve regurgitation is mild. No aortic stenosis is present. Pulmonic Valve: The pulmonic valve was normal in structure. Pulmonic valve  regurgitation is not visualized. No evidence of pulmonic stenosis. Aorta: The aortic root is normal in size and structure. Venous: The inferior vena cava is normal in size with greater than 50% respiratory variability, suggesting right atrial pressure of 3 mmHg. IAS/Shunts: No atrial level shunt detected by color flow Doppler.  LEFT VENTRICLE PLAX 2D LVIDd:         5.00 cm  Diastology LVIDs:         3.20 cm  LV e' lateral:   13.50 cm/s LV PW:         1.10 cm  LV E/e' lateral: 8.1 LV IVS:        0.90 cm  LV e' medial:    8.38 cm/s LVOT diam:     2.10 cm  LV E/e' medial:  13.0 LV SV:         95 LV SV Index:   46 LVOT Area:     3.46 cm  RIGHT VENTRICLE RV S prime:     17.10 cm/s TAPSE (M-mode): 2.3 cm LEFT ATRIUM             Index       RIGHT ATRIUM           Index LA diam:        3.70 cm 1.81 cm/m  RA Area:     14.60 cm LA Vol (A2C):   47.9 ml 23.39 ml/m RA Volume:   31.70 ml  15.48 ml/m LA Vol (A4C):   39.9 ml 19.48 ml/m LA Biplane Vol: 44.7 ml 21.83 ml/m  AORTIC VALVE LVOT Vmax:   161.00 cm/s LVOT Vmean:  98.200 cm/s LVOT VTI:    0.274 m  AORTA Ao Root diam: 3.60 cm MITRAL VALVE MV Area (PHT): 3.16 cm     SHUNTS MV Decel Time: 240 msec     Systemic VTI:  0.27 m MV E velocity: 109.00 cm/s  Systemic Diam: 2.10 cm MV A velocity: 87.70 cm/s MV E/A ratio:  1.24 Armanda Magic MD Electronically signed by Armanda Magic MD Signature Date/Time: 08/01/2019/9:28:17 AM    Final    VAS [place] UPPER EXTREMITY VENOUS DUPLEX  Result Date: 07/31/2019 UPPER VENOUS STUDY  Indications: Pain and swelling in upper arm. Comparison Study: No prior studies. Performing Technologist: Jean Rosenthal  Examination Guidelines: A complete evaluation includes B-mode imaging, spectral Doppler, color Doppler, and power Doppler as needed of all accessible portions of each vessel. Bilateral testing is considered an integral part of a complete examination. Limited examinations for reoccurring indications may be performed as noted.  Right Findings:  +----------+------------+---------+-----------+----------+-------+ RIGHT     CompressiblePhasicitySpontaneousPropertiesSummary +----------+------------+---------+-----------+----------+-------+ Subclavian    Full       Yes       Yes                      +----------+------------+---------+-----------+----------+-------+  Left Findings: +----------+------------+---------+-----------+----------+-------+ LEFT      CompressiblePhasicitySpontaneousPropertiesSummary +----------+------------+---------+-----------+----------+-------+ IJV           Full  Yes       Yes                      +----------+------------+---------+-----------+----------+-------+ Subclavian    Full       Yes       Yes                      +----------+------------+---------+-----------+----------+-------+ Axillary      Full       Yes       Yes                      +----------+------------+---------+-----------+----------+-------+ Brachial      Full                                          +----------+------------+---------+-----------+----------+-------+ Radial        Full                                          +----------+------------+---------+-----------+----------+-------+ Ulnar         Full                                          +----------+------------+---------+-----------+----------+-------+ Cephalic    Partial                                  Acute  +----------+------------+---------+-----------+----------+-------+ Basilic       Full                                          +----------+------------+---------+-----------+----------+-------+  Summary:  Right: No evidence of thrombosis in the subclavian.  Left: No evidence of deep vein thrombosis in the upper extremity. Findings consistent with acute superficial vein thrombosis involving the left cephalic vein.  *See table(s) above for measurements and observations.  Diagnosing physician: Sherald Hess MD  Electronically signed by Sherald Hess MD on 07/31/2019 at 3:46:14 PM.    Final       Assessment and Plan:  1) Acute pancreatitis complicated by necrosis on CT 2) EtOH use disorder 3) EtOH withdrawal symptoms 4) Fever  -T-max 101.1 last evening, then 100.2 this morning.  Overall fever curve trend is improving -WBC was downtrending yesterday.  No repeat CBC this morning -Continue meropenem -Blood cultures with no growth to date -Overall continues to improve clinically, tolerating p.o. intake without any nausea/vomiting -Certainly at risk for pancreatic pseudocyst formation and possibly even PD disruption.  Will potentially need repeat CT imaging to assess for pseudocysts as outpatient -Ongoing pain control per primary TRH service -GI service will follow peripherally through the remainder of the weekend and see again on Monday provided he is still inpatient.  Please not hesitate to contact on-call GI with questions or concerns in the interim   Shellia Cleverly, DO  08/02/2019, 8:25 AM [facility] Pager [phone]

## 2019-08-02 NOTE — NC FL2 (Signed)
[place] MEDICAID FL2 LEVEL OF CARE SCREENING TOOL     IDENTIFICATION  Patient Name: Gary Atkinson Birthdate: [birth year] Sex: male Admission Date (Current Location): 07/23/2019  [facility] and [place] Number:  Producer, television/film/video and Address:  [facility],  [address], [place] [address]      Provider Number: [id number]  Attending Physician Name and Address:  Kathlen Mody, MD  Relative Name and Phone Number:       Current Level of Care: Hospital Recommended Level of Care: Skilled Nursing Facility Prior Approval Number:    Date Approved/Denied:   PASRR Number: Pending  Discharge Plan: SNF    Current Diagnoses: Patient Active Problem List   Diagnosis Date Noted  . Fever   . GAD (generalized anxiety disorder) 07/30/2019  . Major depressive disorder, single episode, severe without psychosis (HCC) 07/30/2019  . Pleural effusion 07/29/2019  . Hypophosphatemia 07/27/2019  . Hypomagnesemia 07/26/2019  . Hypokalemia 07/26/2019  . Delirium tremens (HCC) 07/25/2019  . Acute pancreatitis 07/24/2019  . Intractable nausea and vomiting 07/24/2019  . Abdominal pain 07/24/2019  . GERD (gastroesophageal reflux disease) 07/24/2019  . Hiatal hernia 07/24/2019  . Elevated LFTs 07/24/2019  . Elevated troponin 07/24/2019  . Thrombocytopenia (HCC) 07/24/2019  . CAD (coronary artery disease), native coronary artery 08/25/2013  . Bradycardia 08/25/2013  . Benign essential HTN 08/25/2013  . Dyslipidemia 08/25/2013  . Iron deficiency anemia, unspecified 08/25/2013  . CAP (community acquired pneumonia) 08/24/2013    Orientation RESPIRATION BLADDER Height & Weight     Self, Time, Situation, Place  Normal Continent Weight: 188 lb 5 oz (85.4 kg) Height:  5\' 11"  (180.3 cm)  BEHAVIORAL SYMPTOMS/MOOD NEUROLOGICAL BOWEL NUTRITION STATUS      Continent Diet  AMBULATORY STATUS COMMUNICATION OF NEEDS Skin   Extensive Assist Verbally Normal                        Personal Care Assistance Level of Assistance  Bathing, Feeding, Dressing Bathing Assistance: Maximum assistance Feeding assistance: Independent Dressing Assistance: Maximum assistance     Functional Limitations Info  Sight, Hearing, Speech Sight Info: Impaired (Wears Glasses) Hearing Info: Adequate Speech Info: Adequate    SPECIAL CARE FACTORS FREQUENCY  PT (By licensed PT), OT (By licensed OT)     PT Frequency: 5x/week OT Frequency: 5x/week            Contractures Contractures Info: Not present    Additional Factors Info  Code Status, Allergies, Psychotropic Code Status Info: Fullcode Allergies Info: Allergies: No Known Allergies Psychotropic Info: Buspar, Seroquel         Current Medications (08/02/2019):  This is the current hospital active medication list Current Facility-Administered Medications  Medication Dose Route Frequency Provider Last Rate Last Admin  . 0.45 % sodium chloride infusion   Intravenous Continuous 08/04/2019, MD 100 mL/hr at 08/02/19 0830 New Bag at 08/02/19 0830  . acetaminophen (TYLENOL) tablet 650 mg  650 mg Oral Q6H PRN 08/04/19, MD   650 mg at 08/02/19 0439   Or  . acetaminophen (TYLENOL) suppository 650 mg  650 mg Rectal Q6H PRN 08/04/19, MD   650 mg at 07/26/19 0945  . alum & mag hydroxide-simeth (MAALOX/MYLANTA) 200-200-20 MG/5ML suspension 15 mL  15 mL Oral Q4H PRN 07/28/19, MD   15 mL at 07/31/19 1350  . busPIRone (BUSPAR) tablet 20 mg  20 mg Oral BID 08/02/19, MD   20 mg at 08/02/19  7846  . clonazepam (KLONOPIN) disintegrating tablet 1 mg  1 mg Oral BID PRN Kathlen Mody, MD      . diazepam (VALIUM) tablet 5 mg  5 mg Oral BID Kathlen Mody, MD   5 mg at 08/02/19 0924  . feeding supplement (BOOST / RESOURCE BREEZE) liquid 1 Container  1 Container Oral TID BM Kathlen Mody, MD   1 Container at 08/02/19 [phone]  . fluvoxaMINE (LUVOX) tablet 150 mg  150 mg Oral BID Kathlen Mody, MD   150 mg at 08/02/19 5284  .  folic acid (FOLVITE) tablet 1 mg  1 mg Oral Daily Kathlen Mody, MD   1 mg at 08/02/19 0925  . hydrALAZINE (APRESOLINE) injection 20 mg  20 mg Intravenous Q3H PRN Mansy, Jan A, MD   20 mg at 08/01/19 1657  . HYDROmorphone (DILAUDID) injection 0.5 mg  0.5 mg Intravenous Q4H PRN Kathlen Mody, MD   0.5 mg at 08/02/19 1035  . ipratropium-albuterol (DUONEB) 0.5-2.5 (3) MG/3ML nebulizer solution 3 mL  3 mL Nebulization Q4H PRN Kathlen Mody, MD   3 mL at 07/30/19 2124  . lip balm (CARMEX) ointment   Topical PRN Kathlen Mody, MD   Given at 07/28/19 1639  . lisinopril (ZESTRIL) tablet 10 mg  10 mg Oral Daily Kathlen Mody, MD   10 mg at 08/02/19 0925  . meropenem (MERREM) 1 g in sodium chloride 0.9 % 100 mL IVPB  1 g Intravenous Q8H Pham, Anh P, RPH 200 mL/hr at 08/02/19 0615 1 g at 08/02/19 0615  . multivitamin with minerals tablet 1 tablet  1 tablet Oral Daily Kathlen Mody, MD   1 tablet at 08/02/19 0925  . ondansetron (ZOFRAN) tablet 4 mg  4 mg Oral Q6H PRN Kathlen Mody, MD       Or  . ondansetron (ZOFRAN) injection 4 mg  4 mg Intravenous Q6H PRN Kathlen Mody, MD      . pantoprazole (PROTONIX) injection 40 mg  40 mg Intravenous Q24H Kathlen Mody, MD   40 mg at 08/01/19 2133  . potassium chloride SA (KLOR-CON) CR tablet 40 mEq  40 mEq Oral Once Kathlen Mody, MD      . propranolol (INDERAL) tablet 20 mg  20 mg Oral Daily Kathlen Mody, MD   20 mg at 08/02/19 0925  . QUEtiapine (SEROQUEL) tablet 50 mg  50 mg Oral QHS Kathlen Mody, MD   50 mg at 08/01/19 2127  . thiamine tablet 100 mg  100 mg Oral Daily Kathlen Mody, MD   100 mg at 08/02/19 1324   Or  . thiamine (B-1) injection 100 mg  100 mg Intravenous Daily Kathlen Mody, MD   100 mg at 07/26/19 0947  . topiramate (TOPAMAX) tablet 25 mg  25 mg Oral Daily Kathlen Mody, MD   25 mg at 08/02/19 0924  . traMADol (ULTRAM) tablet 100 mg  100 mg Oral Q6H PRN Kathlen Mody, MD   100 mg at 08/02/19 4010     Discharge Medications: Please see  discharge summary for a list of discharge medications.  Relevant Imaging Results:  Relevant Lab Results:   Additional Information ssn: [id number]  Clearance Coots, LCSW

## 2019-08-02 NOTE — Progress Notes (Addendum)
Transition of Care (TOC) -30 day Note       Patient Details  Name: Gary Atkinson  MRN: [id number] Date of Birth: 04-Oct-1970   Attending Physician: Kathlen Mody MD    MUST ID:    To Whom it May Concern:   Please be advised that the above patient will require a short-term nursing home stay, anticipated 30 days or less rehabilitation and strengthening. The plan is for return home.

## 2019-08-02 NOTE — Progress Notes (Signed)
Pt transferred from ICU, A&O x 4, assessment completed. Pt is tremulous when reaching for object or bringing drink to mouth. Medicated with PM meds along with PRN pain med for c/o pain to abd area

## 2019-08-02 NOTE — TOC Progression Note (Addendum)
Transition of Care [facility]) - Progression Note    Patient Details  Name: Gary Atkinson MRN: [id number] Date of Birth: 1970/06/04  Transition of Care [facility]) CM/SW [place] [place] Phone Number: 08/02/2019, 2:44 PM  Clinical Narrative:    CSW met with the patient at bedside to discuss discharge plan. Patient alert and oriented x4 and agreeable to talk. Patient reports he lives at home alone. He has a sister that lives in the area and the other out of town. Patient reports he was working full-time until June 17, and independent with ADL's prior to admitting to the hospital. CSW explain PT recommendation at this time for SNF rehab placement. CSW explain the SNF process.  Patient prefers to return home if he is strong enough to do so.Patient is open to SNF rehab placement but only for a week or two.  Patient reports he is ready to continue his search for employment and return home to his dog. Patient denies substance use issue with alcohol. Patient express concerns about staff asking him about his alcohol use or bringing it up during conversation about his medical treatment.   It appears the patient does not have insurance on file at this time.  Per patient he insurance. He provided CSW with his insurance card information (See Below) [employer] [facility]) ID# [id number] Member ID# [id number] Group# [id number]  CSW called ED registration to provide insurance information. ED staff unable to enter information.  TOC staff call on Monday.     Expected Discharge Plan: Home/Self Care Barriers to Discharge: Continued Medical Work up  Expected Discharge Plan and Services Expected Discharge Plan: Home/Self Care   Discharge Planning Services: CM Consult   Living arrangements for the past 2 months: Single Family Home                                       Social Determinants of Health (SDOH) Interventions    Readmission Risk Interventions No flowsheet data found.

## 2019-08-03 LAB — COMPREHENSIVE METABOLIC PANEL
ALT: 48 U/L — ABNORMAL HIGH (ref 0–44)
AST: 36 U/L (ref 15–41)
Albumin: 2.1 g/dL — ABNORMAL LOW (ref 3.5–5.0)
Alkaline Phosphatase: 103 U/L (ref 38–126)
Anion gap: 7 (ref 5–15)
BUN: <5 mg/dL — ABNORMAL LOW (ref 6–20)
CO2: 26 mmol/L (ref 22–32)
Calcium: 7.5 mg/dL — ABNORMAL LOW (ref 8.9–10.3)
Chloride: 101 mmol/L (ref 98–111)
Creatinine, Ser: 0.6 mg/dL — ABNORMAL LOW (ref 0.61–1.24)
GFR calc Af Amer: >60 mL/min (ref >60)
GFR calc non Af Amer: >60 mL/min (ref >60)
Glucose, Bld: 89 mg/dL (ref 70–99)
Potassium: 3.5 mmol/L (ref 3.5–5.1)
Sodium: 134 mmol/L — ABNORMAL LOW (ref 135–145)
Total Bilirubin: 0.8 mg/dL (ref 0.3–1.2)
Total Protein: 5.3 g/dL — ABNORMAL LOW (ref 6.5–8.1)

## 2019-08-03 LAB — CBC
HCT: 26.2 % — ABNORMAL LOW (ref 39.0–52.0)
Hemoglobin: 8.8 g/dL — ABNORMAL LOW (ref 13.0–17.0)
MCH: 32 pg (ref 26.0–34.0)
MCHC: 33.6 g/dL (ref 30.0–36.0)
MCV: 95.3 fL (ref 80.0–100.0)
Platelets: 259 10*3/uL (ref 150–400)
RBC: 2.75 MIL/uL — ABNORMAL LOW (ref 4.22–5.81)
RDW: 12.9 % (ref 11.5–15.5)
WBC: 8.3 10*3/uL (ref 4.0–10.5)
nRBC: 0 % (ref 0.0–0.2)

## 2019-08-03 LAB — LIPASE, BLOOD: Lipase: 28 U/L (ref 11–51)

## 2019-08-03 LAB — MAGNESIUM: Magnesium: 1.8 mg/dL (ref 1.7–2.4)

## 2019-08-03 LAB — PHOSPHORUS: Phosphorus: 3.9 mg/dL (ref 2.5–4.6)

## 2019-08-03 NOTE — Progress Notes (Signed)
Pharmacy Antibiotic Note  Gary Atkinson is a 49 y.o. male presented to the ED on 07/23/2019 with c/o abdominal pain and n/v. Abdominal CT showed findings consistent with acute pancreatitis and areas with concern for necrosis.  Pharmacy is consulted on 7/29 to start meropenem for infection.  Today, 08/03/2019: - Tmax 100.7, wbc wnl - scr < 1, stable  Plan: - meropenem 1gm IV q8h  _____________________________________  Height: 5\' 11"  (180.3 cm) Weight: 85.4 kg (188 lb 5 oz) IBW/kg (Calculated) : 75.3  Temp (24hrs), Avg:99.5 F (37.5 C), Min:98.1 F (36.7 C), Max:100.7 F (38.2 C)  Recent Labs  Lab 07/29/19 0151 07/29/19 0151 07/30/19 0211 07/31/19 0201 07/31/19 1500 08/01/19 0207 08/02/19 0354 08/03/19 0414  WBC 5.9  --  11.3*  --  7.7 9.4  --  8.3  CREATININE 0.56*   < > 0.58* 0.66  --  0.59* 0.54* 0.60*   < > = values in this interval not displayed.    Estimated Creatinine Clearance: 116.3 mL/min (A) (by C-G formula based on SCr of 0.6 mg/dL (L)).    No Known Allergies   Thank you for allowing pharmacy to be a part of this patient's care.  10/03/19 D 08/03/2019 1:32 PM

## 2019-08-03 NOTE — Progress Notes (Signed)
PROGRESS NOTE    Gary Atkinson  NKN:[id number] DOB: 1970-07-13 DOA: 07/23/2019 PCP: Patient, No Pcp Per    No chief complaint on file.   Brief Narrative:  49 year old gentleman with prior history of hypertension, alcohol use, GERD, chronically elevated liveR enzymes presents to ED for nausea vomiting and abdominal pain.  On arrival to ED was found to have a lipase of 1500, elevated AST and ALT.  CT of the abdomen was consistent with acute pancreatitis and concern for development of necrosis.  Patient was admitted by [facility] and GI was consulted.  He underwent MRCP showing acute hemorrhagic necrotizing pancreatitis involving 50% of pancreatic parenchyma with no biliary duct dilatation and no choledocholithiasis but with diffuse hepatic steatosis.  Patient was transferred to progressive unit on 7/24 due to worsening alcohol withdrawals with active delirium tremens requiring IV Precedex drip for better control.  PCCM on board and assisting with withdrawal symptoms. Pt was weaned off precedex gtt and transferred to telemetry. Hospital course complicated by fevers . He was started on Merrem on 7/29 for necrotizing pancreatitis. . Pt continues to have low grade fevers of 100.7 today. Pt reports abd pain has improved, is abel to tolerate regular diet without nausea or vomiting.   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 Improving. No nausea, vomiting today. Able to tolerate heart  healthy diet.  GI consulted , recommended repeating CT abdomen and pelvis.  Repeat imaging shows , increase in moderate peripancreatic fluid,consistent with acute pancreatitis. Areas of hypoenhancement within the pancreatic head/uncinate process are consistent with a involving necrosis. Well-defined fluid adjacent the pancreatic tail, for which developing pseudocyst cannot be excluded.  continue with IV dilaudid for severe pain, and tramadol for moderate pain. Pt having nightmares and agitation with oxycodone and OxyContin.  Aggressive hydration with IV fluids and IV anti emetics.  GI on board, and recommended repeat imaging in 2 weeks as outpatient to check the necrosis and evaluate for pseudocyst formation.      Persistent fevers Blood cultures from 07/26/19 negative so far. Repeat cultures ordered and negative so far.  He was started empirically on meropenam as he continued to spike fevers. Wbc  CXR and UA ordered to complete the work up.  CXR shows improving opacities.  UA is negative for infection.     Pulmonary vascular congestion, with bilateral pleural effusions: Probably from aggressive hydration.  Echocardiogram wnl. Currently on RA does not complain of sob or chest pain.  CXR shows improving opacities.     Alcohol abuse with alcohol withdrawal symptoms and delirium tremens. He was started on Precedex drip on 7/25 due to worsening DTs was able to wean him off on 07/29/19 PCCM on board and appreciate their assistance. Continue with CIWA protocol with Ativan, thiamine, multivitamin, folic acid.   Wheezing Appears to have resolved with IV Lasix.  Nasal cannula oxygen as needed.   Continue with duonebs.      Hypomagnesemia hypokalemia hypophosphatemia and hypocalcemia Replaced, repeat  levels wnl.    Pancytopenia Probably secondary to bone marrow suppression from alcohol abuse. Wbc count wnl. Platelets wnl.  Hemoglobin around 8.8.     Hyponatremia probably from  fluid losses reactionary to acute pancreatitis superimposed on chronic alcohol abuse. Much improved, stable around 134.     Transaminitis Probably secondary to hepatic steatosis and /or  alcoholic hepatitis.improving  ALT IS 48 and AST is 36.   Essential hypertension Well controlled.    Substance abuse disorder Psychiatric consulted and recommended starting the patient on Seroquel 50 mg at bedtime. No suicidal or homicidal ideations.    Elevated troponins probably secondary to demand ischemia. No chest pain.   Coronary artery disease s/p DES Currently off aspirin due to hemorrhagic pancreatic necrosis.    Acute thrombocytopenia  Resolved.    Anemia of acute illness Probably from acute pancreatitis.  Hemoglobin 13.8 >12.8>9.9>9.8>8.8.  since admission.  Anemia panel shows low iron levels, will need supplementation on discharge .   PT evaluation recommending SNF. toc consult in place.   DVT prophylaxis: SCD'S Code Status: full code.  Family Communication: none at bedside.  Disposition:   Status is: Inpatient  Remains inpatient appropriate because:IV treatments appropriate due to intensity of illness or inability to take PO   Dispo: The patient is from: Home              Anticipated d/c is to: SNF              Anticipated d/c date is: 1 day              Patient currently is not medically stable to d/c.       Consultants:   PSYCHIATRY  GI  PCCM  Procedures:  Ct abdomen and pelvis  Since 07/23/2019, increase in moderate peripancreatic fluid, consistent with acute pancreatitis. Areas of hypoenhancement within the pancreatic head/uncinate process are consistent with a involving necrosis. Well-defined fluid adjacent the pancreatic tail, for which developing pseudocyst cannot be excluded. 2. Increase in small volume abdominal pelvic ascites and anasarca. 3. New bibasilar airspace disease, favoring atelectasis. 4. Similar small bilateral pleural  effusions. 5. Hepatic steatosis. 6. Left and possible right-sided colonic wall thickening indicative of colitis. This could be secondary to peripancreatic inflammation. 7. 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 No acute portal or splenic vein thrombus. 8. Aortic Atherosclerosis (ICD10-I70.0).  Antimicrobials: Anti-infectives (From admission, onward)   Start     Dose/Rate Route Frequency Ordered Stop   07/31/19 1500  meropenem (MERREM) 1 g in sodium chloride 0.9 % 100 mL IVPB     Discontinue     1 g 200 mL/hr over 30 Minutes Intravenous Every 8 hours 07/31/19 1441        Subjective: No new complaints. No withdrawal symptoms.  No nausea, vomiting today. abd pain improving.   Objective: Vitals:   08/02/19 1719 08/02/19 2002 08/03/19 0521 08/03/19 0807  BP: (!) 156/86 (!) 118/63 (!) 140/80 122/72  Pulse: 74 70 81 72  Resp: 17 16 18 16   Temp: (!) 100.7 F (38.2 C) 98.4 F (36.9 C) (!) 100.4 F (38 C) 98.1 F (36.7 C)  TempSrc: Oral   Oral  SpO2: 96% 96% 98% 94%  Weight:      Height:        Intake/Output Summary (Last 24 hours) at 08/03/2019 1520 Last data filed at 08/03/2019 1125 Gross per 24 hour  Intake 720 ml  Output 2800 ml  Net -2080 ml   Filed  Weights   07/24/19 0552 07/26/19 1324 08/01/19 2205  Weight: 74.9 kg 84.7 kg 85.4 kg    Examination:  General exam: Alert and comfortable not in any kind of distress Respiratory system: Air entry fair, no wheezing or rhonchi Cardiovascular system: S1-S2 heard, regular rate rhythm, no JVD, no pedal edema. Gastrointestinal system: Abdomen is soft, tenderness improving nondistended, bowel sounds normal Central nervous system: Alert and oriented, grossly nonfocal Extremities: No cyanosis or clubbing Skin: No rashes seen. Psychiatry: Mood is appropriate   Data Reviewed: I have personally reviewed following labs and imaging studies  CBC: Recent Labs  Lab 07/29/19 0151 07/30/19 0211  07/31/19 1500 08/01/19 0207 08/03/19 0414  WBC 5.9 11.3* 7.7 9.4 8.3  NEUTROABS  --   --  5.9  --   --   HGB 9.8* 10.3* 10.1* 9.8* 8.8*  HCT 29.7* 30.8* 30.8* 29.0* 26.2*  MCV 98.0 96.6 96.6 93.9 95.3  PLT 149* 214 251 212 259    Basic Metabolic Panel: Recent Labs  Lab 07/30/19 0211 07/31/19 0201 08/01/19 0207 08/01/19 0425 08/02/19 0354 08/03/19 0414  NA 134* 137 134*  --  134* 134*  K 3.2* 3.1* 3.7  --  3.2* 3.5  CL 94* 95* 95*  --  98 101  CO2 27 31 25   --  22 26  GLUCOSE 124* 119* 75  --  94 89  BUN 7 <5* <5*  --  <5* <5*  CREATININE 0.58* 0.66 0.59*  --  0.54* 0.60*  CALCIUM 7.7* 7.6* 7.5*  --  7.3* 7.5*  MG 1.9 1.6*  --  1.7 1.9 1.8  PHOS 4.5 3.1  --  3.0 4.1 3.9    GFR: Estimated Creatinine Clearance: 116.3 mL/min (A) (by C-G formula based on SCr of 0.6 mg/dL (L)).  Liver Function Tests: Recent Labs  Lab 07/30/19 0211 07/31/19 0201 08/01/19 0207 08/02/19 0354 08/03/19 0414  AST 70* 124* 50* 52* 36  ALT 53* 120* 76* 63* 48*  ALKPHOS 92 132* 102 107 103  BILITOT 0.9 0.8 0.6 0.7 0.8  PROT 4.9* 4.6* 5.0* 5.1* 5.3*  ALBUMIN 2.1* 2.0* 1.9* 2.0* 2.1*    CBG: No results for input(s): GLUCAP in the last 168 hours.   Recent Results (from the past 240 hour(s))  Culture, blood (routine x 2)     Status: None   Collection Time: 07/26/19 11:20 AM   Specimen: BLOOD  Result Value Ref Range Status   Specimen Description   Final    BLOOD RIGHT ARM Performed at [facility], [address] [place], [place]    Special Requests   Final    BOTTLES DRAWN AEROBIC AND ANAEROBIC Blood Culture adequate volume Performed at [facility], [address] [place], [place]    Culture   Final    NO GROWTH 5 DAYS Performed at [facility], [address], [place], [place]    Report Status 07/31/2019 FINAL  Final  Culture, blood (routine x 2)     Status: None   Collection Time: 07/26/19 11:23 AM    Specimen: BLOOD  Result Value Ref Range Status   Specimen Description   Final    BLOOD BLOOD RIGHT HAND Performed at [facility], [address] [place], [place]    Special Requests   Final    BOTTLES DRAWN AEROBIC ONLY Performed at [facility], [address], [place], [place]  Culture   Final    NO GROWTH 5 DAYS Performed at [facility], [address], [place], [place] [address]    Report Status 07/31/2019 FINAL  Final  MRSA PCR Screening     Status: None   Collection Time: 07/27/19  8:20 AM   Specimen: Nasal Mucosa; Nasopharyngeal  Result Value Ref Range Status   MRSA by PCR NEGATIVE NEGATIVE Final    Comment:        The GeneXpert MRSA Assay (FDA approved for NASAL specimens only), is one component of a comprehensive MRSA colonization surveillance program. It is not intended to diagnose MRSA infection nor to guide or monitor treatment for MRSA infections. Performed at [facility], [address], [place], [place] [address]   Culture, blood (Routine X 2) w Reflex to ID Panel     Status: None (Preliminary result)   Collection Time: 08/01/19 11:31 AM   Specimen: BLOOD RIGHT HAND  Result Value Ref Range Status   Specimen Description   Final    BLOOD RIGHT HAND Performed at [facility], [address], [place], [place] [address]    Special Requests   Final    BOTTLES DRAWN AEROBIC ONLY Blood Culture adequate volume Performed at [facility], [address] [place], [place] [address]    Culture   Final    NO GROWTH 2 DAYS Performed at [facility], [address], [place], [place] [address]    Report Status PENDING  Incomplete  Culture, blood (Routine X 2) w Reflex to ID Panel     Status: None (Preliminary result)   Collection Time: 08/01/19 11:40 AM   Specimen: BLOOD LEFT HAND  Result Value Ref Range Status   Specimen  Description   Final    BLOOD LEFT HAND Performed at [facility], [address], [place], [place] [address]    Special Requests   Final    BOTTLES DRAWN AEROBIC AND ANAEROBIC Blood Culture adequate volume Performed at [facility], [address], [place], [place] [address]    Culture   Final    NO GROWTH 2 DAYS Performed at [facility], [address] [place], [place] [address]    Report Status PENDING  Incomplete         Radiology Studies: No results found.      Scheduled Meds: . busPIRone  20 mg Oral BID  . diazepam  5 mg Oral BID  . feeding supplement  1 Container Oral TID BM  . fluvoxaMINE  150 mg Oral BID  . folic acid  1 mg Oral Daily  . lisinopril  10 mg Oral Daily  . multivitamin with minerals  1 tablet Oral Daily  . pantoprazole (PROTONIX) IV  40 mg Intravenous Q24H  . propranolol  20 mg Oral Daily  . QUEtiapine  50 mg Oral QHS  . thiamine  100 mg Oral Daily   Or  . thiamine  100 mg Intravenous Daily  . topiramate  25 mg Oral Daily   Continuous Infusions: . sodium chloride 75 mL/hr at 08/02/19 1520  . meropenem (MERREM) IV 1 g (08/03/19 0829)     LOS: 10 days        Kathlen Mody, MD [facility]   To contact the attending provider between 7A-7P or the covering provider during after hours 7P-7A, please log into the web site [email or web address] and access using universal [place]  password for that web site. If you do not have the password, please call the hospital operator.  08/03/2019, 3:20 PM

## 2019-08-04 DIAGNOSIS — K8689 Other specified diseases of pancreas: Secondary | ICD-10-CM | POA: Diagnosis not present

## 2019-08-04 LAB — COMPREHENSIVE METABOLIC PANEL
ALT: 40 U/L (ref 0–44)
AST: 31 U/L (ref 15–41)
Albumin: 1.9 g/dL — ABNORMAL LOW (ref 3.5–5.0)
Alkaline Phosphatase: 97 U/L (ref 38–126)
Anion gap: 12 (ref 5–15)
BUN: <5 mg/dL — ABNORMAL LOW (ref 6–20)
CO2: 22 mmol/L (ref 22–32)
Calcium: 7.7 mg/dL — ABNORMAL LOW (ref 8.9–10.3)
Chloride: 101 mmol/L (ref 98–111)
Creatinine, Ser: 0.61 mg/dL (ref 0.61–1.24)
GFR calc Af Amer: >60 mL/min (ref >60)
GFR calc non Af Amer: >60 mL/min (ref >60)
Glucose, Bld: 97 mg/dL (ref 70–99)
Potassium: 3.4 mmol/L — ABNORMAL LOW (ref 3.5–5.1)
Sodium: 135 mmol/L (ref 135–145)
Total Bilirubin: 0.8 mg/dL (ref 0.3–1.2)
Total Protein: 5.3 g/dL — ABNORMAL LOW (ref 6.5–8.1)

## 2019-08-04 LAB — CBC WITH DIFFERENTIAL/PLATELET
Abs Immature Granulocytes: 0.07 10*3/uL (ref 0.00–0.07)
Basophils Absolute: 0 10*3/uL (ref 0.0–0.1)
Basophils Relative: 1 %
Eosinophils Absolute: 0.1 10*3/uL (ref 0.0–0.5)
Eosinophils Relative: 2 %
HCT: 26.1 % — ABNORMAL LOW (ref 39.0–52.0)
Hemoglobin: 8.6 g/dL — ABNORMAL LOW (ref 13.0–17.0)
Immature Granulocytes: 1 %
Lymphocytes Relative: 11 %
Lymphs Abs: 0.8 10*3/uL (ref 0.7–4.0)
MCH: 31.6 pg (ref 26.0–34.0)
MCHC: 33 g/dL (ref 30.0–36.0)
MCV: 96 fL (ref 80.0–100.0)
Monocytes Absolute: 0.6 10*3/uL (ref 0.1–1.0)
Monocytes Relative: 8 %
Neutro Abs: 5.9 10*3/uL (ref 1.7–7.7)
Neutrophils Relative %: 77 %
Platelets: 312 10*3/uL (ref 150–400)
RBC: 2.72 MIL/uL — ABNORMAL LOW (ref 4.22–5.81)
RDW: 13 % (ref 11.5–15.5)
WBC: 7.5 10*3/uL (ref 4.0–10.5)
nRBC: 0 % (ref 0.0–0.2)

## 2019-08-04 LAB — MAGNESIUM: Magnesium: 1.7 mg/dL (ref 1.7–2.4)

## 2019-08-04 LAB — LIPASE, BLOOD: Lipase: 26 U/L (ref 11–51)

## 2019-08-04 LAB — PHOSPHORUS: Phosphorus: 3.5 mg/dL (ref 2.5–4.6)

## 2019-08-04 MED ORDER — POTASSIUM CHLORIDE CRYS ER 20 MEQ PO TBCR
40.0000 meq | EXTENDED_RELEASE_TABLET | Freq: Once | ORAL | Status: AC
  Administered 2019-08-04: 40 meq via ORAL
  Filled 2019-08-04: qty 2

## 2019-08-04 NOTE — Progress Notes (Signed)
Physical Therapy Treatment Patient Details Name: Gary Atkinson MRN: [id number] DOB: September 01, 1970 Today's Date: 08/04/2019    History of Present Illness Patient is a 49 y.o. male with medical history significant for GERD, HTN, alcohol use (reports being a social drinker/binge drinker) chronic elevated LFTs who presented on 07/23/2019 with two days of nausea, multiple episodes of nonbilious/nonbloody vomiting, and abdominal pain and was found to have an elevated lipase of 1500, AST and ALT elevated with CT abdomen consistent with acute pancreatitis and concern for the development of necrosis. Admitted on 07/23/19. PMH significant for Allergic rhinitis, Anxiety, Asthma, CAD, Depression, GERD, Hyperlipemia, Hypertension, MI and Pneumonia.    PT Comments    General Comments: AxO 3 appears at prior level.  Admits he does not rememer his first few days.  Able to recall recent events and following multi step commands.Pt in bed eager to walk.  General Gait Details: No AD needed.  Mild drift but self corrected.  Tolerated an increased distance.  Mild tremors present and slight dyspnea (mask) obstruction.  General stair comments: performed a flight of stairs alternating up and step to down.  Pt lives on the third floor of apartment complex.  Did require one seated rest break on stairs x 3 min due to dyspnea  "I have been here over a week and not walking much".  "I don't like this mask"  Pt plans to D/C back home and have sister assist initially.  Will update LPT Pt expected to make a full recovery back to work and back to golfing  Follow Up Recommendations  Home health PT     Equipment Recommendations       Recommendations for Other Services       Precautions / Restrictions Precautions Precautions: Fall    Mobility  Bed Mobility Overal bed mobility: Modified Independent             General bed mobility comments: increased time  Transfers Overall transfer level: Needs assistance Equipment  used: 1 person hand held assist Transfers: Sit to/from [employer] Transfers Sit to Stand: Supervision Stand pivot transfers: Supervision       General transfer comment: Supervision for safety with mild teremors noted thrioughout and c/o "stiffness" and "swelling" associated with leack of mobility during his stay.  Ambulation/Gait Ambulation/Gait assistance: Supervision;Min guard Gait Distance (Feet): 500 Feet Assistive device: 1 person hand held assist Gait Pattern/deviations: Step-through pattern;Drifts right/left Gait velocity: decreased   General Gait Details: No AD needed.  Mild drift but self corrected.  Tolerated an increased distance.  Mild tremors present and slight dyspnea (mask) obstruction   Stairs Stairs: Yes Stairs assistance: Supervision;Min guard Stair Management: One rail Left Number of Stairs: 16 General stair comments: performed a flight of stairs alternating up and step to down.  Pt lives on the third floor of apartment complex.  Did require one seated rest break on stairs x 3 min due to dyspnea  "I have been here over a week and not walking".   Wheelchair Mobility    Modified Rankin (Stroke Patients Only)       Balance                                            Cognition Arousal/Alertness: Awake/alert Behavior During Therapy: WFL for tasks assessed/performed Overall Cognitive Status: Within Functional Limits for tasks assessed  General Comments: AxO 3 appears at prior level.  Admits he does not rememer his first few days.  Able to recall recent events and following multi step commands.      Exercises      General Comments        Pertinent Vitals/Pain Pain Assessment: 0-10 Pain Score: 6  Pain Location: abdomen "from breakfast" Pain Descriptors / Indicators: Aching;Discomfort Pain Intervention(s): Monitored during session;Patient requesting pain meds-RN notified     Home Living                      Prior Function            PT Goals (current goals can now be found in the care plan section) Progress towards PT goals: Progressing toward goals    Frequency    Min 3X/week      PT Plan Current plan remains appropriate    Co-evaluation              AM-PAC PT "6 Clicks" Mobility   Outcome Measure  Help needed turning from your back to your side while in a flat bed without using bedrails?: None Help needed moving from lying on your back to sitting on the side of a flat bed without using bedrails?: None Help needed moving to and from a bed to a chair (including a wheelchair)?: None Help needed standing up from a chair using your arms (e.g., wheelchair or bedside chair)?: None Help needed to walk in hospital room?: A Little Help needed climbing 3-5 steps with a railing? : A Little 6 Click Score: 22    End of Session Equipment Utilized During Treatment: Gait belt Activity Tolerance: Patient tolerated treatment well Patient left: in bed;with call bell/phone within reach;with family/visitor present Nurse Communication: Mobility status;Patient requests pain meds PT Visit Diagnosis: Muscle weakness (generalized) (M62.81);Unsteadiness on feet (R26.81);Other abnormalities of gait and mobility (R26.89);Difficulty in walking, not elsewhere classified (R26.2)     Time: 7782-4235 PT Time Calculation (min) (ACUTE ONLY): 23 min  Charges:  $Gait Training: 8-22 mins $Therapeutic Activity: 8-22 mins                     .Felecia Shelling  PTA Acute  Rehabilitation Services Pager      [phone] Office      [phone]

## 2019-08-04 NOTE — Progress Notes (Addendum)
[facility] Progress Note  CC:  Necrotizing EtOH pancreatitis,   Subjective: He is having less abdominal pain. He ate one pancake, 2 sausage links and oatmeal for breakfast this morning. No N/V. He is concerned his pain medication administration is intermittent, sometimes waits a long time to receive his pain meds and other times the nurses awaken him from sleep to administer the pain medications. Currently, he is having minimal right and left central and lower abdominal pain. He is urinating clear yellow urine. He passed a moderate amount of soft brown "sandy" stool yesterday. No rectal bleeding or black stools. He complains of having soreness to the left chest area, if he pushes on his chest his pain is reproduced. No SOB. No other complaints at this time.   Objective:   Abdominal/Pelvic CT with contrast 07/31/2019: 1. Since 07/23/2019, increase in moderate peripancreatic fluid, consistent with acute pancreatitis. Areas of hypoenhancement within the pancreatic head/uncinate process are consistent with a involving necrosis. Well-defined fluid adjacent the pancreatic tail, for which developing pseudocyst cannot be excluded. 2. Increase in small volume abdominal pelvic ascites and anasarca. 3. New bibasilar airspace disease, favoring atelectasis. 4. Similar small bilateral pleural effusions. 5. Hepatic steatosis. 6. Left and possible right-sided colonic wall thickening indicative of colitis. This could be secondary to peripancreatic inflammation. 7. 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 No acute portal or splenic vein thrombus. 8. Aortic Atherosclerosis   RUQ sono 07/25/2019: 1. Hepatic steatosis. 2. No gallstones or biliary dilatation. 3. Small volume perihepatic ascites and right pleural effusion.  Abdominal MRI/MRCP 07/24/2019: 1. Acute hemorrhagic necrotizing pancreatitis involving approximately 50% of the pancreatic parenchyma. No  discrete measurable peripancreatic fluid collections. 2. No biliary ductal dilatation. No cholelithiasis or choledocholithiasis. 3. Prominent diffuse hepatic steatosis. 4. Small dependent bilateral pleural effusions, new. Small volume abdominal ascites. 5. Patulous lower thoracic esophagus with air-fluid level suggesting esophageal dysmotility and/or gastroesophageal reflux.  Vital signs in last 24 hours: Temp:  [98.1 F (36.7 C)-101.5 F (38.6 C)] 98.5 F (36.9 C) (08/02 0655) Pulse Rate:  [72-81] 78 (08/02 0655) Resp:  [16-20] 17 (08/02 0655) BP: (122-158)/(72-88) 137/81 (08/02 0655) SpO2:  [94 %-96 %] 96 % (08/02 0655) Last BM Date: 08/03/19 General:   Alert 49 year old male in NAD. Eyes: No scleral icterus.  Heart: RRR, no murmur.  Chest: Left mid chest tenderness with palpation.  Pulm: Breath sounds clear throughout.  Abdomen: Soft, nondistended. Mild tenderness right and left mid abdomen and throughout the lower abdomen without rebound or guarding. No ascites. + BS x 4 quads. No HSM. Extremities:  Without edema. Neurologic:  Alert and  oriented x4;  grossly normal neurologically. UEs slightly tremulous. No asterixis.  Psych:  Alert and cooperative. Normal mood and affect.  Intake/Output from previous day: 08/01 0701 - 08/02 0700 In: 480 [P.O.:480] Out: 2275 [Urine:2275] Intake/Output this shift: No intake/output data recorded.  Lab Results: Recent Labs    08/03/19 0414  WBC 8.3  HGB 8.8*  HCT 26.2*  PLT 259   BMET Recent Labs    08/02/19 0354 08/03/19 0414  NA 134* 134*  K 3.2* 3.5  CL 98 101  CO2 22 26  GLUCOSE 94 89  BUN <5* <5*  CREATININE 0.54* 0.60*  CALCIUM 7.3* 7.5*   LFT Recent Labs    08/03/19 0414  PROT 5.3*  ALBUMIN 2.1*  AST 36  ALT 48*  ALKPHOS 103  BILITOT 0.8   PT/INR No results for input(s): LABPROT,  INR in the last 72 hours. Hepatitis Panel No results for input(s): HEPBSAG, HCVAB, HEPAIGM, HEPBIGM in the last 72  hours.  No results found.  Assessment / Plan:  75. 49 year old male with EtOH pancreatitis with necrosis to the pancreatic head/ucinate process per repeat CTAP 07/31/2019. At risk for pancreatic pseudocyst formation.  Meropenum started on 7/29. Temp spiked to 101.5 F this morning. Blood cultures 7/30 no growth at 48 hrs. Lipase 26. CBC not done this am.  -CBC with diff -Continue Meropenum IV -Pain management per the hospitalist  -Continue NS IV @ 75cc/hr -No alcohol  -? Repeat blood cultures   2. Elevated LFTs most likely due to EtOH abuse and hepatic steatosis. Acute hepatitis panel negative. LFTs drifting downward.  AST 36. ALT 48 on 8/1. -CMP  3. Alcohol withdrawal with DTs which required transfer to the progressive unit and IV Precedex 7/25 which was weaned off 7/27. He was transferred to telemetry. CIWA protocol continued.   4. GERD -Continue Pantoprazole 40mg  IV Q 24 hours  5.Acute on chronic iron deficiency anemia. Hg drifting downward. Hg 8.8. HCT 26.2 on 8/1. Iron 15. TIBC 192. Ferritin 900. B12 612 on 7/26. No obvious active GI bleeding.  EGD/colonoscopy/small bowel capsule endoscopy at [facility] in 2016 were unrevealing. EGD 12/17/2018 showed a 1cm HH, reflux and reactive gastropathy.  -Consider IV iron during admission  -No plans for endoscopic evaluation at this time   6. History of CAD, MI, s/p DES stent. Elevated Troponin levels most likely due to demand ischemia. Patient c/o left chest wall soreness which with tactile pressure to area (most likely musculoskeletal pain and less likely cardiac etiology).  -Defer further cardiac evaluation to the hospitalist   7. Left and possible right sided colonic wall thickening suggestive of colitis per CTAP 7/292021, could be secondary to peripancreatic inflammation. No diarrhea.   8. Weight loss    LOS: 11 days   07-15-1991  08/04/2019,  08:30AM    ________________________________________________________________________  10/04/2019 GI MD note:  I personally examined the patient, reviewed the data and agree with the assessment and plan described above.  He has suffered severe, necrotizing pancreatitis from etoh abuse.  Also acute etoh hepatitis and DTs.  Currently he is much improved (eating solid food with minimal discomfort, passing gas) however he is still having fevers despite increased abx coverage to meropenum for 3 days now.  I think we should have ID formally consult on the case.  Encourage ambulation in halls as well.  Will follow along.   Corinda Gubler, MD [facility] Pager [phone]

## 2019-08-04 NOTE — Consult Note (Signed)
[facility]    Date of Admission:  07/23/2019          Reason for Consult: Pancreatitis with fever    Referring Provider: Dr. Wendall Papa  Assessment: He has acute pancreatitis causing fever.  However there is little else to suggest infected pancreatic necrosis at this time.  He is improving.  I agree with observation off of antibiotics for now.  Plan: 1. Observe off antibiotics  Principal Problem:   Fever Active Problems:   Acute pancreatitis   Pancreatic necrosis   Benign essential HTN   Intractable nausea and vomiting   Abdominal pain   GERD (gastroesophageal reflux disease)   Hiatal hernia   Elevated LFTs   Elevated troponin   Thrombocytopenia (HCC)   Delirium tremens (HCC)   Hypomagnesemia   Hypokalemia   Hypophosphatemia   Pleural effusion   GAD (generalized anxiety disorder)   Major depressive disorder, single episode, severe without psychosis (HCC)   Scheduled Meds: . busPIRone  20 mg Oral BID  . diazepam  5 mg Oral BID  . feeding supplement  1 Container Oral TID BM  . fluvoxaMINE  150 mg Oral BID  . folic acid  1 mg Oral Daily  . lisinopril  10 mg Oral Daily  . multivitamin with minerals  1 tablet Oral Daily  . pantoprazole (PROTONIX) IV  40 mg Intravenous Q24H  . propranolol  20 mg Oral Daily  . QUEtiapine  50 mg Oral QHS  . thiamine  100 mg Oral Daily   Or  . thiamine  100 mg Intravenous Daily  . topiramate  25 mg Oral Daily   Continuous Infusions: . sodium chloride 75 mL/hr at 08/04/19 1201   PRN Meds:.acetaminophen **OR** acetaminophen, alum & mag hydroxide-simeth, clonazePAM, hydrALAZINE, HYDROmorphone (DILAUDID) injection, ipratropium-albuterol, lip balm, ondansetron **OR** ondansetron (ZOFRAN) IV, traMADol  HPI: Gary Atkinson is a 49 y.o. male who was admitted hospital on 07/23/2019 fever, sweats, nausea.  CT scan showed acute pancreatitis.  He began having fever on 07/26/2019.  Blood cultures have been  negative.  Repeat CT scan shows some evolving early fluid around the pancreas and some areas of hypoenhancement compatible with early necrosis.  No frank abscess was noted.  Started on empiric meropenem 5 days ago but that was stopped this morning.  He is feeling better.  He is having less abdominal pain have resolved.  He is advancing his diet.   Review of Systems: Review of Systems  Constitutional: Positive for chills, diaphoresis and fever.  Respiratory: Positive for shortness of breath. Negative for cough and sputum production.   Cardiovascular: Negative for chest pain.  Gastrointestinal: Positive for abdominal pain. Negative for diarrhea, nausea and vomiting.    Past Medical History:  Diagnosis Date  . Allergic rhinitis   . Anxiety   . Asthma   . CAD (coronary artery disease)   . Depression   . GERD (gastroesophageal reflux disease)   . Hyperlipemia   . Hypertension   . MI (myocardial infarction) (HCC)   . Pneumonia     Social History   Tobacco Use  . Smoking status: Former Smoker    Types: Cigarettes    Quit date: 2002    Years since quitting: 19.5  . Smokeless tobacco: Never Used  Substance Use Topics  . Alcohol use: Yes    Comment: social  . Drug use: Never    Family History  Problem Relation Age  of Onset  . Liver disease Father   . Alcoholism Father    No Known Allergies  OBJECTIVE: Blood pressure (!) 154/87, pulse 74, temperature 98.9 F (37.2 C), temperature source Oral, resp. rate 18, height 5\' 11"  (1.803 m), weight 85.4 kg, SpO2 99 %.  Physical Exam Constitutional:      Comments: He is resting quietly in bed.  He is in good spirits.  Cardiovascular:     Rate and Rhythm: Normal rate and regular rhythm.     Heart sounds: No murmur heard.   Pulmonary:     Effort: Pulmonary effort is normal.     Breath sounds: Normal breath sounds.  Abdominal:     General: There is no distension.     Palpations: Abdomen is soft.     Tenderness: There is no  abdominal tenderness.     Comments: Active bowel sounds.  Psychiatric:        Mood and Affect: Mood normal.     Lab Results Lab Results  Component Value Date   WBC 7.5 08/04/2019   HGB 8.6 (L) 08/04/2019   HCT 26.1 (L) 08/04/2019   MCV 96.0 08/04/2019   PLT 312 08/04/2019    Lab Results  Component Value Date   CREATININE 0.61 08/04/2019   BUN <5 (L) 08/04/2019   NA 135 08/04/2019   K 3.4 (L) 08/04/2019   CL 101 08/04/2019   CO2 22 08/04/2019    Lab Results  Component Value Date   ALT 40 08/04/2019   AST 31 08/04/2019   ALKPHOS 97 08/04/2019   BILITOT 0.8 08/04/2019     Microbiology: Recent Results (from the past 240 hour(s))  Culture, blood (routine x 2)     Status: None   Collection Time: 07/26/19 11:20 AM   Specimen: BLOOD  Result Value Ref Range Status   Specimen Description   Final    BLOOD RIGHT ARM Performed at [facility], [address], [place], [place]    Special Requests   Final    BOTTLES DRAWN AEROBIC AND ANAEROBIC Blood Culture adequate volume Performed at [facility], [address], [place], [place]    Culture   Final    NO GROWTH 5 DAYS Performed at [facility], [address], [place], [place]    Report Status 07/31/2019 FINAL  Final  Culture, blood (routine x 2)     Status: None   Collection Time: 07/26/19 11:23 AM   Specimen: BLOOD  Result Value Ref Range Status   Specimen Description   Final    BLOOD BLOOD RIGHT HAND Performed at [facility], [address] [place], [place]    Special Requests   Final    BOTTLES DRAWN AEROBIC ONLY Performed at [facility], [address], [place], [place]    Culture   Final    NO GROWTH 5 DAYS Performed at [facility], [address], [place], [place]    Report Status 07/31/2019 FINAL  Final  MRSA PCR Screening     Status: None   Collection  Time: 07/27/19  8:20 AM   Specimen: Nasal Mucosa; Nasopharyngeal  Result Value Ref Range Status   MRSA by PCR NEGATIVE NEGATIVE Final    Comment:        The GeneXpert MRSA Assay (FDA approved for NASAL specimens only), is one component of a comprehensive MRSA colonization surveillance  program. It is not intended to diagnose MRSA infection nor to guide or monitor treatment for MRSA infections. Performed at [facility], [address] [place], [place] [address]   Culture, blood (Routine X 2) w Reflex to ID Panel     Status: None (Preliminary result)   Collection Time: 08/01/19 11:31 AM   Specimen: BLOOD RIGHT HAND  Result Value Ref Range Status   Specimen Description   Final    BLOOD RIGHT HAND Performed at [facility], [address], [place], [place] [address]    Special Requests   Final    BOTTLES DRAWN AEROBIC ONLY Blood Culture adequate volume Performed at [facility], [address], [place], [place] [address]    Culture   Final    NO GROWTH 3 DAYS Performed at [facility], [address] [place], [place] [address]    Report Status PENDING  Incomplete  Culture, blood (Routine X 2) w Reflex to ID Panel     Status: None (Preliminary result)   Collection Time: 08/01/19 11:40 AM   Specimen: BLOOD LEFT HAND  Result Value Ref Range Status   Specimen Description   Final    BLOOD LEFT HAND Performed at [facility], [address], [place], [place] [address]    Special Requests   Final    BOTTLES DRAWN AEROBIC AND ANAEROBIC Blood Culture adequate volume Performed at [facility], [address], [place], [place] [address]    Culture   Final    NO GROWTH 3 DAYS Performed at [facility], [address], [place], [place] [address]    Report Status PENDING  Incomplete    Cliffton Asters, MD [facility] [phone] pager   [phone] cell 08/04/2019, 1:58 PM

## 2019-08-04 NOTE — Progress Notes (Signed)
Pts temp is down after tylenol given

## 2019-08-04 NOTE — Progress Notes (Signed)
   08/04/19 0457  Vitals  Temp (!) 101.5 F (38.6 C)  Temp Source Oral  BP (!) 158/88  MAP (mmHg) 109  BP Location Left Arm  BP Method Automatic  Patient Position (if appropriate) Lying  Pulse Rate 81  Pulse Rate Source Monitor  Resp 17  Level of Consciousness  Level of Consciousness Alert  Oxygen Therapy  SpO2 96 %  O2 Device Room Air  Yellow MEWS initiated , triggered for a fever & HR = 109 PRN meds given

## 2019-08-04 NOTE — TOC Progression Note (Signed)
Transition of Care [facility]) - Progression Note    Patient Details  Name: Gary Atkinson MRN: [id number] Date of Birth: 03/13/1970  Transition of [facility] CM/SW Contact  Ida Rog[place], [place] Phone Number: 08/04/2019, 10:22 AM  Clinical Narrative:   Called regiastration-gave them insurance information.     Expected Discharge Plan: Home/Self Care Barriers to Discharge: Continued Medical Work up  Expected Discharge Plan and Services Expected Discharge Plan: Home/Self Care   Discharge Planning Services: CM Consult   Living arrangements for the past 2 months: Single Family Home                                       Social Determinants of Health (SDOH) Interventions    Readmission Risk Interventions No flowsheet data found.

## 2019-08-04 NOTE — Progress Notes (Signed)
PROGRESS NOTE    Gary Atkinson  KVQ:[id number] DOB: June 04, 1970 DOA: 07/23/2019 PCP: Patient, No Pcp Per    No chief complaint on file.   Brief Narrative:  49 year old gentleman with prior history of hypertension, alcohol use, GERD, chronically elevated liveR enzymes presents to ED for nausea vomiting and abdominal pain.  On arrival to ED was found to have a lipase of 1500, elevated AST and ALT.  CT of the abdomen was consistent with acute pancreatitis and concern for development of necrosis.  Patient was admitted by [facility] and GI was consulted.  He underwent MRCP showing acute hemorrhagic necrotizing pancreatitis involving 50% of pancreatic parenchyma with no biliary duct dilatation and no choledocholithiasis but with diffuse hepatic steatosis.  Patient was transferred to progressive unit on 7/24 due to worsening alcohol withdrawals with active delirium tremens requiring IV Precedex drip for better control.  PCCM on board and assisting with withdrawal symptoms. Pt was weaned off precedex gtt and transferred to telemetry. Hospital course complicated by fevers . He was started on Merrem on 7/29 for necrotizing pancreatitis. . Pt continues to have fevers despite IV meropenam. Today is 5th day of antibiotics. Discussed with Dr Orvan Falconerampbell with Infectious disease, went over the cultures which have been negative so far, and pt is clinically improving, recommended to monitor off antibiotics. Repeat cultures have been ordered today.   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 Improving recommended to patient avoid the IV Dilaudid at this time.  Patient denies any nausea or vomiting and pain is better controlled.  GI consulted , recommended repeating CT abdomen and pelvis on 07/31/19.  Repeat imaging shows , increase in moderate peripancreatic fluid,consistent with acute pancreatitis. Areas of hypoenhancement within the pancreatic head/uncinate process are consistent with a involving necrosis. Well-defined fluid adjacent the pancreatic tail, for which developing pseudocyst cannot be excluded. Pain control with IV dilaudid for severe pain, and tramadol for moderate pain. Pt having nightmares and agitation with oxycodone and OxyContin.  Aggressive hydration with IV fluids and IV anti emetics.  GI on board, and recommended repeat imaging in 2 weeks as outpatient to check the necrosis and evaluate for pseudocyst formation.      Persistent fevers Blood cultures from 07/26/19 and 07/31/19 negative so far. Repeat cultures ordered and negative so far.  He was started empirically on meropenam as he continued to spike fevers. WBC count wnl.  CXR and UA ordered to complete the work up.  CXR shows improving opacities.  UA is negative for infection.  Discussed with infectious disease Dr. Orvan Falconerampbell recommended to monitor off antibiotics at this time.    Pulmonary vascular congestion, with bilateral pleural effusions: Probably from aggressive hydration.  Echocardiogram wnl. Currently on RA does not complain of sob or chest pain.  CXR shows improving opacities.     Alcohol abuse with alcohol withdrawal symptoms and delirium tremens. He was started on Precedex drip on 7/25 due to worsening DTs was able to wean him  off on 07/29/19 PCCM on board and appreciate their assistance. Continue with CIWA protocol with Ativan, thiamine, multivitamin, folic acid. Signs of withdrawal at this  time.  Wheezing Appears to have resolved with IV Lasix.  Nasal cannula oxygen as needed.   Continue with duonebs.      Hypomagnesemia hypokalemia hypophosphatemia and hypocalcemia Replaced.   Pancytopenia Probably secondary to bone marrow suppression from alcohol abuse. Wbc count wnl. Platelets wnl.  Hemoglobin stable around 8.6.    Hyponatremia probably from fluid losses reactionary to acute pancreatitis superimposed on chronic alcohol abuse. Much improved, stable around 134.     Transaminitis Probably secondary to hepatic steatosis and /or  alcoholic hepatitis.improving  Liver enzymes have normalized.  Essential hypertension Well-controlled   Substance abuse disorder Psychiatric consulted and recommended starting the patient on Seroquel 50 mg at bedtime. No suicidal or homicidal ideations.    Elevated troponins probably secondary to demand ischemia. No chest pain.   Coronary artery disease s/p DES Currently off aspirin due to hemorrhagic pancreatic necrosis.    Acute thrombocytopenia  Resolved   Anemia of acute illness Probably from acute pancreatitis.  Hemoglobin 13.8 >12.8>9.9>9.8>8.8> 8.6.  since admission.  Anemia panel shows low iron levels, will need supplementation on discharge .   PT evaluation recommending SNF. toc consult in place.  Repeat PT evaluation and clearance  DVT prophylaxis: SCD'S Code Status: full code.  Family Communication: none at bedside.  Disposition:   Status is: Inpatient  Remains inpatient appropriate because:IV treatments appropriate due to intensity of illness or inability to take PO   Dispo: The patient is from: Home              Anticipated d/c is to: SNF              Anticipated d/c date is: 1 day              Patient currently is not medically stable to d/c.       Consultants:   PSYCHIATRY  GI  PCCM  Procedures:  Ct abdomen and pelvis  Since 07/23/2019, increase in moderate peripancreatic  fluid, consistent with acute pancreatitis. Areas of hypoenhancement within the pancreatic head/uncinate process are consistent with a involving necrosis. Well-defined fluid adjacent the pancreatic tail, for which developing pseudocyst cannot be excluded. 2. Increase in small volume abdominal pelvic ascites and anasarca. 3. New bibasilar airspace disease, favoring atelectasis. 4. Similar small bilateral pleural effusions. 5. Hepatic steatosis. 6. Left and possible right-sided colonic wall thickening indicative of colitis. This could be secondary to peripancreatic inflammation. 7. 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 No acute portal or splenic vein thrombus. 8. Aortic Atherosclerosis (ICD10-I70.0).  Antimicrobials: Anti-infectives (From admission, onward)   Start     Dose/Rate Route Frequency Ordered Stop   07/31/19 1500  meropenem (MERREM) 1 g in sodium chloride 0.9 % 100 mL IVPB  Status:  Discontinued        1 g 200 mL/hr over 30 Minutes Intravenous Every 8 hours 07/31/19 1441 08/04/19 1209      Subjective: No nausea vomiting abdominal pain is controlled with pain meds.  Will start  transitioning to oral pain medications. Objective: Vitals:   08/04/19 0457 08/04/19 0633 08/04/19 0655 08/04/19 0852  BP: (!) 158/88  (!) 137/81 124/79  Pulse: 81  78 86  Resp: Temp: (!) 101.5 F (38.6 C) 98.5 F (36.9 C) 98.5 F (36.9 C) 98.3 F (36.8 C)  TempSrc: Oral  Oral Oral  SpO2: 96%  96% 97%  Weight:      Height:        Intake/Output Summary (Last 24 hours) at 08/04/2019 1212 Last data filed at 08/04/2019 0905 Gross per 24 hour  Intake 240 ml  Output 1500 ml  Net -1260 ml   Filed Weights   07/24/19 0552 07/26/19 1324 08/01/19 2205  Weight: 74.9 kg 84.7 kg 85.4 kg    Examination:  General exam: Alert and comfortable, not in any distress. Respiratory system: Bilateral air entry fair, no wheezing or rhonchi. Cardiovascular system:  S1-S2 heard, regular rate rhythm, no JVD, no pedal edema  gastrointestinal system: Abdomen is soft, nontender, nondistended, bowel sounds are normal Central nervous system: Alert and oriented, grossly nonfocal Extremities: No cyanosis or clubbing Skin: No rashes seen Psychiatry: Mood is appropriate   Data Reviewed: I have personally reviewed following labs and imaging studies  CBC: Recent Labs  Lab 07/30/19 0211 07/31/19 1500 08/01/19 0207 08/03/19 0414 08/04/19 0804  WBC 11.3* 7.7 9.4 8.3 7.5  NEUTROABS  --  5.9  --   --  5.9  HGB 10.3* 10.1* 9.8* 8.8* 8.6*  HCT 30.8* 30.8* 29.0* 26.2* 26.1*  MCV 96.6 96.6 93.9 95.3 96.0  PLT 214 251 212 259 312    Basic Metabolic Panel: Recent Labs  Lab 07/31/19 0201 08/01/19 0207 08/01/19 0425 08/02/19 0354 08/03/19 0414 08/04/19 0433 08/04/19 0804  NA 137 134*  --  134* 134*  --  135  K 3.1* 3.7  --  3.2* 3.5  --  3.4*  CL 95* 95*  --  98 101  --  101  CO2 31 25  --  22 26  --  22  GLUCOSE 119* 75  --  94 89  --  97  BUN <5* <5*  --  <5* <5*  --  <5*  CREATININE 0.66 0.59*  --  0.54* 0.60*  --  0.61  CALCIUM 7.6* 7.5*  --  7.3* 7.5*  --  7.7*  MG 1.6*  --  1.7 1.9 1.8 1.7  --   PHOS 3.1  --  3.0 4.1 3.9 3.5  --     GFR: Estimated Creatinine Clearance: 116.3 mL/min (by C-G formula based on SCr of 0.61 mg/dL).  Liver Function Tests: Recent Labs  Lab 07/31/19 0201 08/01/19 0207 08/02/19 0354 08/03/19 0414 08/04/19 0804  AST 124* 50* 52* 36 31  ALT 120* 76* 63* 48* 40  ALKPHOS 132* 102 107 103 97  BILITOT 0.8 0.6 0.7 0.8 0.8  PROT 4.6* 5.0* 5.1* 5.3* 5.3*  ALBUMIN 2.0* 1.9* 2.0* 2.1* 1.9*    CBG: No results for input(s): GLUCAP in the last 168 hours.   Recent Results (from the past 240 hour(s))  Culture, blood (routine x 2)     Status: None   Collection Time: 07/26/19 11:20 AM   Specimen: BLOOD  Result Value Ref Range Status   Specimen Description   Final    BLOOD RIGHT ARM Performed at [facility], [address], [place], [place] [address]    Special Requests   Final    BOTTLES DRAWN AEROBIC AND ANAEROBIC Blood Culture adequate volume Performed at [facility], [address] [place], [place] [address]    Culture   Final    NO GROWTH 5 DAYS Performed at [facility], [address], [place], [place] [address]    Report Status 07/31/2019 FINAL  Final  Culture, blood (routine x 2)     Status: None   Collection Time: 07/26/19 11:23 AM   Specimen: BLOOD  Result Value Ref Range Status   Specimen Description   Final    BLOOD BLOOD RIGHT HAND Performed at [facility], [address], [place], [place] [address]    Special Requests   Final    BOTTLES DRAWN AEROBIC ONLY Performed at [facility], [address] [place], [place] [address]    Culture   Final    NO GROWTH 5 DAYS Performed at [facility], [address] [place], [place] [address]    Report Status 07/31/2019 FINAL  Final  MRSA PCR Screening     Status: None   Collection Time: 07/27/19  8:20 AM   Specimen: Nasal Mucosa; Nasopharyngeal  Result Value Ref Range Status   MRSA by PCR NEGATIVE NEGATIVE Final    Comment:        The GeneXpert MRSA Assay (FDA approved for NASAL specimens only), is one component of a comprehensive MRSA colonization surveillance program. It is not intended to diagnose MRSA infection nor to guide or monitor treatment for MRSA infections. Performed at [facility], [address] [place], [place] [address]   Culture, blood (Routine X 2) w Reflex to ID Panel     Status: None (Preliminary result)   Collection Time: 08/01/19 11:31 AM   Specimen: BLOOD RIGHT HAND  Result Value Ref Range Status   Specimen Description   Final    BLOOD RIGHT HAND Performed at [facility], [address], [place], [place] [address]    Special Requests   Final    BOTTLES DRAWN  AEROBIC ONLY Blood Culture adequate volume Performed at [facility], [address] [place], [place] [address]    Culture   Final    NO GROWTH 3 DAYS Performed at [facility], [address], [place], [place] [address]    Report Status PENDING  Incomplete  Culture, blood (Routine X 2) w Reflex to ID Panel     Status: None (Preliminary result)   Collection Time: 08/01/19 11:40 AM   Specimen: BLOOD LEFT HAND  Result Value Ref Range Status   Specimen Description   Final    BLOOD LEFT HAND Performed at [facility], [address] [place], [place] [address]    Special Requests   Final    BOTTLES DRAWN AEROBIC AND ANAEROBIC Blood Culture adequate volume Performed at [facility], [address] [place], [place] [address]    Culture   Final    NO GROWTH 3 DAYS Performed at [facility], [address] [place], [place] [address]    Report Status PENDING  Incomplete         Radiology Studies: No results found.      Scheduled Meds:  busPIRone  20 mg Oral BID   diazepam  5 mg Oral BID   feeding supplement  1 Container Oral TID BM   fluvoxaMINE  150 mg Oral BID   folic acid  1 mg Oral Daily   lisinopril  10 mg Oral Daily   multivitamin with minerals  1 tablet Oral Daily   pantoprazole (PROTONIX) IV  40 mg Intravenous Q24H   propranolol  20 mg Oral Daily   QUEtiapine  50 mg Oral QHS   thiamine  100 mg Oral Daily   Or  thiamine  100 mg Intravenous Daily   topiramate  25 mg Oral Daily   Continuous Infusions:  sodium chloride 75 mL/hr at 08/04/19 1201     LOS: 11 days        Kathlen Mody, MD [facility]   To contact the attending provider between 7A-7P or the covering provider during after hours 7P-7A, please log into the web site [email or web address] and access using universal  password for that web site. If you do not have the password, please call the hospital  operator.  08/04/2019, 12:12 PM

## 2019-08-05 ENCOUNTER — Telehealth: Payer: Self-pay

## 2019-08-05 LAB — BASIC METABOLIC PANEL
Anion gap: 15 (ref 5–15)
BUN: <5 mg/dL — ABNORMAL LOW (ref 6–20)
CO2: 21 mmol/L — ABNORMAL LOW (ref 22–32)
Calcium: 8.2 mg/dL — ABNORMAL LOW (ref 8.9–10.3)
Chloride: 98 mmol/L (ref 98–111)
Creatinine, Ser: 0.73 mg/dL (ref 0.61–1.24)
GFR calc Af Amer: >60 mL/min (ref >60)
GFR calc non Af Amer: >60 mL/min (ref >60)
Glucose, Bld: 80 mg/dL (ref 70–99)
Potassium: 4.2 mmol/L (ref 3.5–5.1)
Sodium: 134 mmol/L — ABNORMAL LOW (ref 135–145)

## 2019-08-05 LAB — MAGNESIUM: Magnesium: 2 mg/dL (ref 1.7–2.4)

## 2019-08-05 LAB — LIPASE, BLOOD: Lipase: 26 U/L (ref 11–51)

## 2019-08-05 LAB — PHOSPHORUS: Phosphorus: 4.3 mg/dL (ref 2.5–4.6)

## 2019-08-05 MED ORDER — HYDROMORPHONE HCL 1 MG/ML IJ SOLN
0.5000 mg | Freq: Four times a day (QID) | INTRAMUSCULAR | Status: DC | PRN
  Administered 2019-08-05: 0.5 mg via INTRAVENOUS
  Filled 2019-08-05: qty 0.5

## 2019-08-05 MED ORDER — PANTOPRAZOLE SODIUM 40 MG PO TBEC
40.0000 mg | DELAYED_RELEASE_TABLET | Freq: Every day | ORAL | Status: DC
  Administered 2019-08-05 – 2019-08-06 (×2): 40 mg via ORAL
  Filled 2019-08-05 (×2): qty 1

## 2019-08-05 NOTE — Progress Notes (Addendum)
Progress Note   Subjective  Chief Complaint: Necrotizing alcoholic pancreatitis  Today, patient continues to tell me that he is improving slowly.  He has been able to continue his low fat/heart healthy diet.  Does tell me he tried to eat some sausage yesterday and then had to take 3 Tums due to some reflux issues.  He has continued with bowel movements though they are "not quite normal yet".  Tells me discussed with hospitalist team that he would prefer to stay here for the next 3 to 4 days to see if he continues to improve.  No nausea or vomiting.  No fevers over the past 24 hours.   Objective   Vital signs in last 24 hours: Temp:  [98.6 F (37 C)-99.7 F (37.6 C)] 98.6 F (37 C) (08/03 0419) Pulse Rate:  [74-90] 79 (08/03 0419) Resp:  [18] 18 (08/03 0419) BP: (128-176)/(79-91) 128/79 (08/03 0419) SpO2:  [98 %-100 %] 100 % (08/03 0419) Last BM Date: 08/03/19 General:    white male in NAD Heart:  Regular rate and rhythm; no murmurs Lungs: Respirations even and unlabored, lungs CTA bilaterally Abdomen:  Soft, mild epigastric ttp and nondistended. Normal bowel sounds. Extremities:  Without edema. Neurologic:  Alert and oriented,  grossly normal neurologically. Psych:  Cooperative. Normal mood and affect.  Lab Results: Recent Labs    08/03/19 0414 08/04/19 0804  WBC 8.3 7.5  HGB 8.8* 8.6*  HCT 26.2* 26.1*  PLT 259 312   BMET Recent Labs    08/03/19 0414 08/04/19 0804 08/05/19 0351  NA 134* 135 134*  K 3.5 3.4* 4.2  CL 101 101 98  CO2 26 22 21*  GLUCOSE 89 97 80  BUN <5* <5* <5*  CREATININE 0.60* 0.61 0.73  CALCIUM 7.5* 7.7* 8.2*   Hepatic Function Latest Ref Rng & Units 08/04/2019 08/03/2019 08/02/2019  Total Protein 6.5 - 8.1 g/dL 5.3(L) 5.3(L) 5.1(L)  Albumin 3.5 - 5.0 g/dL 1.9(L) 2.1(L) 2.0(L)  AST 15 - 41 U/L 31 36 52(H)  ALT 0 - 44 U/L 40 48(H) 63(H)  Alk Phosphatase 38 - 126 U/L 97 103 107  Total Bilirubin 0.3 - 1.2 mg/dL 0.8 0.8 0.7  Bilirubin, Direct  0.0 - 0.3 mg/dL - - -     Assessment / Plan:    Assessment: 1.  Alcoholic pancreatitis with necrosis to the pancreatic head/uncinate process 2.  Elevated LFTs: Normal today 3.  Alcohol withdrawal DTs 4.  GERD 5.  Acute on chronic iron deficiency anemia: Hemoglobin remaining stable 6.  History of CAD, MI status post DES stent 7.  Abnormal CT of the abdomen: Showing left and possible right-sided colonic wall thickening suggestive of colitis per CTAP 07/31/2019?  Secondary to peripancreatic inflammation  Plan: 1.  Continue current diet 2.  No fevers over the past 24 hours, ID was formally consulted and recommended no antibiotics 3.  Continue ambulation 4.  Please await further recommendations from Dr. Ardis Hughs later today  Thank you for kind consultation, we will continue to follow along.   LOS: 12 days   Levin Erp  08/05/2019, 10:04 AM   ________________________________________________________________________  Velora Heckler GI MD note:  I personally examined the patient, reviewed the data and agree with the assessment and plan described above.  He is progressing quite well, appreciate ID input. He's been off abx for 24 hours now without fevers.  He is eating solid diet. He is OK for d/c from my perspective if he is felt  strong enough to go home.  We will arrange follow up in our office in 4-6 weeks. He will probably need surveillance imaging (CT) of his abdomen but as long as he is clinically improving that can wait several months.  Please call or page with any further questions or concerns.    Owens Loffler, MD [facility] Pager [phone]

## 2019-08-05 NOTE — Telephone Encounter (Signed)
-----   Message from Rachael Fee, MD sent at 08/05/2019  1:17 PM EDT ----- He is leaving hosp in next few days, needs OV with Armbruster or extender in 4-6 weeks for follow up.  Thanks

## 2019-08-05 NOTE — Progress Notes (Signed)
Patient ID: Gary Atkinson, male   DOB: [birth year], 49 y.o.   MRN: [id number]         [facility] for Infectious Disease  Date of Admission:  07/23/2019     ASSESSMENT: He is not showing any signs of clinical instability to suggest infected pancreatic necrosis.  PLAN: 1. Continue observation off of antibiotics  Principal Problem:   Fever Active Problems:   Acute pancreatitis   Pancreatic necrosis   Benign essential HTN   Intractable nausea and vomiting   Abdominal pain   GERD (gastroesophageal reflux disease)   Hiatal hernia   Elevated LFTs   Elevated troponin   Thrombocytopenia (HCC)   Delirium tremens (HCC)   Hypomagnesemia   Hypokalemia   Hypophosphatemia   Pleural effusion   GAD (generalized anxiety disorder)   Major depressive disorder, single episode, severe without psychosis (HCC)   Scheduled Meds:  busPIRone  20 mg Oral BID   diazepam  5 mg Oral BID   feeding supplement  1 Container Oral TID BM   fluvoxaMINE  150 mg Oral BID   folic acid  1 mg Oral Daily   lisinopril  10 mg Oral Daily   multivitamin with minerals  1 tablet Oral Daily   pantoprazole (PROTONIX) IV  40 mg Intravenous Q24H   propranolol  20 mg Oral Daily   QUEtiapine  50 mg Oral QHS   thiamine  100 mg Oral Daily   Or   thiamine  100 mg Intravenous Daily   topiramate  25 mg Oral Daily   Continuous Infusions:  sodium chloride 75 mL/hr at 08/05/19 0140   PRN Meds:.acetaminophen **OR** acetaminophen, alum & mag hydroxide-simeth, clonazePAM, hydrALAZINE, HYDROmorphone (DILAUDID) injection, ipratropium-albuterol, lip balm, ondansetron **OR** ondansetron (ZOFRAN) IV, traMADol   SUBJECTIVE: He is feeling better overall but still has abdominal pain that he rates as 6-7 out of 10.  He was able to eat some breakfast.  Review of Systems: Review of Systems  Constitutional: Negative for chills, diaphoresis and fever.  Respiratory: Negative for cough and shortness of breath.    Cardiovascular: Negative for chest pain.  Gastrointestinal: Positive for abdominal pain. Negative for nausea and vomiting.    No Known Allergies  OBJECTIVE: Vitals:   08/04/19 1307 08/04/19 1714 08/04/19 2053 08/05/19 0419  BP: (!) 154/87 (!) 170/90 (!) 176/91 128/79  Pulse: 74 79 90 79  Resp: 18 18 18 18   Temp: 98.9 F (37.2 C) 99.2 F (37.3 C) 99.7 F (37.6 C) 98.6 F (37 C)  TempSrc: Oral     SpO2: 99% 100% 98% 100%  Weight:      Height:       Body mass index is 26.26 kg/m.  Physical Exam Constitutional:      Comments: He is resting quietly in bed.  Cardiovascular:     Rate and Rhythm: Normal rate and regular rhythm.     Heart sounds: No murmur heard.   Pulmonary:     Effort: Pulmonary effort is normal.     Breath sounds: Normal breath sounds.  Abdominal:     General: There is no distension.     Palpations: Abdomen is soft.     Tenderness: There is no abdominal tenderness.     Lab Results Lab Results  Component Value Date   WBC 7.5 08/04/2019   HGB 8.6 (L) 08/04/2019   HCT 26.1 (L) 08/04/2019   MCV 96.0 08/04/2019   PLT 312 08/04/2019    Lab Results  Component Value  Date   CREATININE 0.73 08/05/2019   BUN <5 (L) 08/05/2019   NA 134 (L) 08/05/2019   K 4.2 08/05/2019   CL 98 08/05/2019   CO2 21 (L) 08/05/2019    Lab Results  Component Value Date   ALT 40 08/04/2019   AST 31 08/04/2019   ALKPHOS 97 08/04/2019   BILITOT 0.8 08/04/2019     Microbiology: Recent Results (from the past 240 hour(s))  Culture, blood (routine x 2)     Status: None   Collection Time: 07/26/19 11:20 AM   Specimen: BLOOD  Result Value Ref Range Status   Specimen Description   Final    BLOOD RIGHT ARM Performed at [facility], [address], [place], [place] [address]    Special Requests   Final    BOTTLES DRAWN AEROBIC AND ANAEROBIC Blood Culture adequate volume Performed at [facility], [address] [place],  [place] [address]    Culture   Final    NO GROWTH 5 DAYS Performed at [facility], [address] [place], [place] [address]    Report Status 07/31/2019 FINAL  Final  Culture, blood (routine x 2)     Status: None   Collection Time: 07/26/19 11:23 AM   Specimen: BLOOD  Result Value Ref Range Status   Specimen Description   Final    BLOOD BLOOD RIGHT HAND Performed at [facility], [address], [place], [place] [address]    Special Requests   Final    BOTTLES DRAWN AEROBIC ONLY Performed at [facility], [address], [place], [place] [address]    Culture   Final    NO GROWTH 5 DAYS Performed at [facility], [address] [place], [place] [address]    Report Status 07/31/2019 FINAL  Final  MRSA PCR Screening     Status: None   Collection Time: 07/27/19  8:20 AM   Specimen: Nasal Mucosa; Nasopharyngeal  Result Value Ref Range Status   MRSA by PCR NEGATIVE NEGATIVE Final    Comment:        The GeneXpert MRSA Assay (FDA approved for NASAL specimens only), is one component of a comprehensive MRSA colonization surveillance program. It is not intended to diagnose MRSA infection nor to guide or monitor treatment for MRSA infections. Performed at [facility], [address], [place], [place] [address]   Culture, blood (Routine X 2) w Reflex to ID Panel     Status: None (Preliminary result)   Collection Time: 08/01/19 11:31 AM   Specimen: BLOOD RIGHT HAND  Result Value Ref Range Status   Specimen Description   Final    BLOOD RIGHT HAND Performed at [facility], [address] [place], [place] [address]    Special Requests   Final    BOTTLES DRAWN AEROBIC ONLY Blood Culture adequate volume Performed at [facility], [address], [place], [place] [address]    Culture   Final    NO GROWTH 3 DAYS Performed at [facility], [address], [place], [place]  [address]    Report Status PENDING  Incomplete  Culture, blood (Routine X 2) w Reflex to ID Panel     Status: None (Preliminary result)   Collection Time: 08/01/19 11:40 AM   Specimen: BLOOD LEFT HAND  Result Value Ref Range Status   Specimen Description   Final  BLOOD LEFT HAND Performed at [facility], [address] [place], [place] [address]    Special Requests   Final    BOTTLES DRAWN AEROBIC AND ANAEROBIC Blood Culture adequate volume Performed at [facility], [address] [place], [place] [address]    Culture   Final    NO GROWTH 3 DAYS Performed at [facility], [address], [place], [place] [address]    Report Status PENDING  Incomplete    Cliffton Asters, MD [facility] for Infectious Disease [facility] [phone] pager   [phone] cell 08/05/2019, 10:25 AM

## 2019-08-05 NOTE — Telephone Encounter (Signed)
09/10/19 at 950 am appt with Dr Adela Lank  The pt will be advised at discharge

## 2019-08-05 NOTE — Progress Notes (Signed)
PROGRESS NOTE    Gary Atkinson  GDJ:[id number] DOB: [birth year] DOA: 07/23/2019 PCP: Patient, No Pcp Per    No chief complaint on file.   Brief Narrative:  49 year old gentleman with prior history of hypertension, alcohol use, GERD, chronically elevated liveR enzymes presents to ED for nausea vomiting and abdominal pain.  On arrival to ED was found to have a lipase of 1500, elevated AST and ALT.  CT of the abdomen was consistent with acute pancreatitis and concern for development of necrosis.  Patient was admitted by [facility] and GI was consulted.  He underwent MRCP showing acute hemorrhagic necrotizing pancreatitis involving 50% of pancreatic parenchyma with no biliary duct dilatation and no choledocholithiasis but with diffuse hepatic steatosis.  Patient was transferred to progressive unit on 7/24 due to worsening alcohol withdrawals with active delirium tremens requiring IV Precedex drip for better control.  PCCM on board and assisting with withdrawal symptoms. Pt was weaned off precedex gtt and transferred to telemetry. Hospital course complicated by fevers . He was started on Merrem on 7/29 for necrotizing pancreatitis. . Pt continues to have fevers despite IV meropenam. Today is 5th day of antibiotics. Discussed with Dr Orvan Falconer with Infectious disease, went over the cultures which have been negative so far, and pt is clinically improving, recommended to monitor off antibiotics. Repeat cultures have been ordered today.  Pt seen and examined today, low grade fevers overnight , but no other complaints. Possible d/c in am.   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 Improving, recommended to cut down on IV dilaudid for pain control.  GI consulted , recommended repeating CT abdomen and pelvis on 07/31/19.  Repeat imaging shows , increase in moderate peripancreatic fluid,consistent with acute pancreatitis. Areas of hypoenhancement within the pancreatic head/uncinate process are consistent with a involving necrosis. Well-defined fluid adjacent the pancreatic tail, for which developing pseudocyst cannot be excluded. Pain control with IV dilaudid for severe pain, and tramadol for moderate pain. Pt having nightmares and agitation with oxycodone and OxyContin.  GI on board, and recommended repeat imaging i as outpatient to check the necrosis and evaluate for pseudocyst formation.  Pt denies any nausea, vomiting and able to tolerate low fat diet without worsening abdominal pain.      Persistent fevers Blood cultures from 07/26/19 and 07/31/19 negative so far. Repeat cultures ordered and negative so far.  He was started empirically on meropenam on 7/29 as he continued to spike fevers, which was stopped after discussing with Dr Orvan Falconer. WBC count wnl.  CXR and UA ordered to complete the work up.  CXR shows improving opacities.  UA is negative for infection.  Discussed with infectious disease Dr. Orvan Falconer recommended to monitor off antibiotics at this time.    Pulmonary vascular congestion, with bilateral pleural effusions: Probably from aggressive hydration.  Echocardiogram wnl. Currently on RA does not complain of sob or chest pain.  CXR shows improving opacities.  Alcohol abuse with alcohol withdrawal symptoms and delirium tremens. He was started on Precedex drip on 7/25 due to worsening DTs was able to wean him off on 07/29/19 PCCM  on board and appreciate their assistance. Continue with CIWA protocol with Ativan, thiamine, multivitamin, folic acid. No sings of withdrawal at this time.   Wheezing Appears to have resolved with IV Lasix.  Nasal cannula oxygen as needed.   Continue with duonebs.   Hypomagnesemia hypokalemia hypophosphatemia and hypocalcemia Replaced.   Pancytopenia Probably secondary to bone marrow suppression from alcohol abuse. Wbc count wnl. Platelets wnl.  Hemoglobin stable around 8.6.    Hyponatremia probably from fluid losses reactionary to acute pancreatitis superimposed on chronic alcohol abuse. Much improved.     Transaminitis Probably secondary to hepatic steatosis and /or  alcoholic hepatitis.improving  Liver enzymes have normalized.  Essential hypertension Well controlled.    Substance abuse disorder Psychiatric consulted and recommended starting the patient on Seroquel 50 mg at bedtime. No suicidal or homicidal ideations.    Elevated troponins probably secondary to demand ischemia. No chest pain.   Coronary artery disease s/p DES Currently off aspirin due to hemorrhagic pancreatic necrosis.    Acute thrombocytopenia  Resolved   Anemia of acute illness Probably from acute pancreatitis.  Hemoglobin 13.8 >12.8>9.9>9.8>8.8> 8.6.  since admission.  Anemia panel shows low iron levels, will need supplementation on discharge .   PT evaluation recommending SNF initially, but he is able to walk 500 feet and new recommendations for Home with home health PT.   DVT prophylaxis: SCD'S Code Status: full code.  Family Communication: none at bedside, discussed with sister at bedside on 08/04/19.  Disposition:   Status is: Inpatient  Remains inpatient appropriate because:Inpatient level of care appropriate due to severity of illness, if no fever for 24 hours, he can be discharged home.    Dispo: The patient is from: Home              Anticipated d/c is to: Home               Anticipated d/c date is: 1 day              Patient currently is not medically stable to d/c.       Consultants:   PSYCHIATRY  GI  PCCM  Procedures:  Ct abdomen and pelvis  Since 07/23/2019, increase in moderate peripancreatic fluid, consistent with acute pancreatitis. Areas of hypoenhancement within the pancreatic head/uncinate process are consistent with a involving necrosis. Well-defined fluid adjacent the pancreatic tail, for which developing pseudocyst cannot be excluded. 2. Increase in small volume abdominal pelvic ascites and anasarca. 3. New bibasilar airspace disease, favoring atelectasis. 4. Similar small bilateral pleural effusions. 5. Hepatic steatosis. 6. Left and possible right-sided colonic wall thickening indicative of colitis. This could be secondary to peripancreatic inflammation. 7. 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 No acute portal or splenic vein thrombus. 8. Aortic Atherosclerosis (ICD10-I70.0).  Antimicrobials: Anti-infectives (From admission, onward)   Start     Dose/Rate Route Frequency Ordered Stop   07/31/19 1500  meropenem (MERREM) 1 g in sodium chloride 0.9 % 100 mL IVPB  Status:  Discontinued        1 g 200 mL/hr over 30 Minutes Intravenous Every 8 hours 07/31/19 1441 08/04/19 1209      Subjective: No nausea, vomiting . abd pain controlled. Pt reports he had been off IV dilaudid for 8 hours last night.  Objective: Vitals:  08/04/19 1714 08/04/19 2053 08/05/19 0419 08/05/19 1314  BP: (!) 170/90 (!) 176/91 128/79 (!) 146/81  Pulse: 79 90 79 77  Resp: 18 18 18 19   Temp: 99.2 F (37.3 C) 99.7 F (37.6 C) 98.6 F (37 C) (!) 100.6 F (38.1 C)  TempSrc:    Oral  SpO2: 100% 98% 100% 97%  Weight:      Height:        Intake/Output Summary (Last 24 hours) at 08/05/2019 1509 Last data filed at 08/05/2019 1145 Gross per 24 hour  Intake --  Output 1350 ml  Net -1350 ml   Filed Weights   07/24/19 0552  07/26/19 1324 08/01/19 2205  Weight: 74.9 kg 84.7 kg 85.4 kg    Examination:  General exam: Alert and comfortable, not in any kind of distress. Respiratory system: Bilateral air entry fair, no wheezing or rhonchi no tachypnea Cardiovascular system: S1-S2 heard, regular rate rhythm, no JVD, no pedal edema.  gastrointestinal system: Abdomen is soft, nontender, nondistended bowel sounds normal Central nervous system: Alert and oriented, grossly nonfocal Extremities: No cyanosis or clubbing Skin: No rashes seen Psychiatry: Mood is appropriate  Data Reviewed: I have personally reviewed following labs and imaging studies  CBC: Recent Labs  Lab 07/30/19 0211 07/31/19 1500 08/01/19 0207 08/03/19 0414 08/04/19 0804  WBC 11.3* 7.7 9.4 8.3 7.5  NEUTROABS  --  5.9  --   --  5.9  HGB 10.3* 10.1* 9.8* 8.8* 8.6*  HCT 30.8* 30.8* 29.0* 26.2* 26.1*  MCV 96.6 96.6 93.9 95.3 96.0  PLT 214 251 212 259 312    Basic Metabolic Panel: Recent Labs  Lab 07/31/19 0201 08/01/19 0207 08/01/19 0425 08/02/19 0354 08/03/19 0414 08/04/19 0433 08/04/19 0804 08/05/19 0351  NA  --  134*  --  134* 134*  --  135 134*  K  --  3.7  --  3.2* 3.5  --  3.4* 4.2  CL  --  95*  --  98 101  --  101 98  CO2  --  25  --  22 26  --  22 21*  GLUCOSE  --  75  --  94 89  --  97 80  BUN  --  <5*  --  <5* <5*  --  <5* <5*  CREATININE  --  0.59*  --  0.54* 0.60*  --  0.61 0.73  CALCIUM  --  7.5*  --  7.3* 7.5*  --  7.7* 8.2*  MG   < >  --  1.7 1.9 1.8 1.7  --  2.0  PHOS   < >  --  3.0 4.1 3.9 3.5  --  4.3   < > = values in this interval not displayed.    GFR: Estimated Creatinine Clearance: 116.3 mL/min (by C-G formula based on SCr of 0.73 mg/dL).  Liver Function Tests: Recent Labs  Lab 07/31/19 0201 08/01/19 0207 08/02/19 0354 08/03/19 0414 08/04/19 0804  AST 124* 50* 52* 36 31  ALT 120* 76* 63* 48* 40  ALKPHOS 132* 102 107 103 97  BILITOT 0.8 0.6 0.7 0.8 0.8  PROT 4.6* 5.0* 5.1* 5.3* 5.3*  ALBUMIN  2.0* 1.9* 2.0* 2.1* 1.9*    CBG: No results for input(s): GLUCAP in the last 168 hours.   Recent Results (from the past 240 hour(s))  MRSA PCR Screening     Status: None   Collection Time: 07/27/19  8:20 AM   Specimen: Nasal Mucosa; Nasopharyngeal  Result  Value Ref Range Status   MRSA by PCR NEGATIVE NEGATIVE Final    Comment:        The GeneXpert MRSA Assay (FDA approved for NASAL specimens only), is one component of a comprehensive MRSA colonization surveillance program. It is not intended to diagnose MRSA infection nor to guide or monitor treatment for MRSA infections. Performed at [facility], [address], [place], [place] [address]   Culture, blood (Routine X 2) w Reflex to ID Panel     Status: None (Preliminary result)   Collection Time: 08/01/19 11:31 AM   Specimen: BLOOD RIGHT HAND  Result Value Ref Range Status   Specimen Description   Final    BLOOD RIGHT HAND Performed at [facility], [address] [place], [place] [address]    Special Requests   Final    BOTTLES DRAWN AEROBIC ONLY Blood Culture adequate volume Performed at [facility], [address] [place], [place] [address]    Culture   Final    NO GROWTH 3 DAYS Performed at [facility], [address] [place], [place] [address]    Report Status PENDING  Incomplete  Culture, blood (Routine X 2) w Reflex to ID Panel     Status: None (Preliminary result)   Collection Time: 08/01/19 11:40 AM   Specimen: BLOOD LEFT HAND  Result Value Ref Range Status   Specimen Description   Final    BLOOD LEFT HAND Performed at [facility], [address] [place], [place] [address]    Special Requests   Final    BOTTLES DRAWN AEROBIC AND ANAEROBIC Blood Culture adequate volume Performed at [facility], [address] [place], [place] [address]    Culture   Final    NO GROWTH 3 DAYS Performed at [facility], [address], [place], [place] [address]    Report Status PENDING  Incomplete         Radiology Studies: No results found.      Scheduled Meds: . busPIRone  20 mg Oral BID  . diazepam  5 mg Oral BID  . feeding supplement  1 Container Oral TID BM  . fluvoxaMINE  150 mg Oral BID  . folic acid  1 mg Oral Daily  . lisinopril  10 mg Oral Daily  . multivitamin with minerals  1 tablet Oral Daily  . pantoprazole  40 mg Oral Q0600  . propranolol  20 mg Oral Daily  . QUEtiapine  50 mg Oral QHS  . thiamine  100 mg Oral Daily   Or  . thiamine  100 mg Intravenous Daily  . topiramate  25 mg Oral Daily   Continuous Infusions:    LOS: 12 days        Kathlen Mody, MD [facility]   To contact the attending provider between 7A-7P or the covering provider during after hours 7P-7A, please log into the web site [email or web address] and access using universal [facility] password for that web site. If you do not have the password, please call the hospital operator.  08/05/2019, 3:09 PM

## 2019-08-06 DIAGNOSIS — K8689 Other specified diseases of pancreas: Secondary | ICD-10-CM

## 2019-08-06 DIAGNOSIS — J90 Pleural effusion, not elsewhere classified: Secondary | ICD-10-CM

## 2019-08-06 LAB — CBC
HCT: 25 % — ABNORMAL LOW (ref 39.0–52.0)
Hemoglobin: 8.3 g/dL — ABNORMAL LOW (ref 13.0–17.0)
MCH: 32 pg (ref 26.0–34.0)
MCHC: 33.2 g/dL (ref 30.0–36.0)
MCV: 96.5 fL (ref 80.0–100.0)
Platelets: 381 10*3/uL (ref 150–400)
RBC: 2.59 MIL/uL — ABNORMAL LOW (ref 4.22–5.81)
RDW: 13.2 % (ref 11.5–15.5)
WBC: 6 10*3/uL (ref 4.0–10.5)
nRBC: 0 % (ref 0.0–0.2)

## 2019-08-06 LAB — COMPREHENSIVE METABOLIC PANEL
ALT: 33 U/L (ref 0–44)
AST: 29 U/L (ref 15–41)
Albumin: 1.9 g/dL — ABNORMAL LOW (ref 3.5–5.0)
Alkaline Phosphatase: 92 U/L (ref 38–126)
Anion gap: 12 (ref 5–15)
BUN: <5 mg/dL — ABNORMAL LOW (ref 6–20)
CO2: 22 mmol/L (ref 22–32)
Calcium: 7.9 mg/dL — ABNORMAL LOW (ref 8.9–10.3)
Chloride: 103 mmol/L (ref 98–111)
Creatinine, Ser: 0.57 mg/dL — ABNORMAL LOW (ref 0.61–1.24)
GFR calc Af Amer: >60 mL/min (ref >60)
GFR calc non Af Amer: >60 mL/min (ref >60)
Glucose, Bld: 91 mg/dL (ref 70–99)
Potassium: 3.5 mmol/L (ref 3.5–5.1)
Sodium: 137 mmol/L (ref 135–145)
Total Bilirubin: 0.8 mg/dL (ref 0.3–1.2)
Total Protein: 5.5 g/dL — ABNORMAL LOW (ref 6.5–8.1)

## 2019-08-06 LAB — CULTURE, BLOOD (ROUTINE X 2)
Culture: NO GROWTH
Culture: NO GROWTH
Special Requests: ADEQUATE
Special Requests: ADEQUATE

## 2019-08-06 MED ORDER — FOLIC ACID 1 MG PO TABS: 1.0000 mg | ORAL_TABLET | Freq: Every day | ORAL | 0 refills | Status: DC

## 2019-08-06 MED ORDER — THIAMINE HCL 100 MG PO TABS: 100.0000 mg | ORAL_TABLET | Freq: Every day | ORAL | 0 refills | Status: DC

## 2019-08-06 MED ORDER — PROPRANOLOL HCL 40 MG PO TABS: 20.0000 mg | ORAL_TABLET | Freq: Every day | ORAL | 0 refills | Status: DC

## 2019-08-06 MED ORDER — ADULT MULTIVITAMIN W/MINERALS CH: 1.0000 | ORAL_TABLET | Freq: Every day | ORAL | Status: DC

## 2019-08-06 MED ORDER — LISINOPRIL 10 MG PO TABS: 10.0000 mg | ORAL_TABLET | Freq: Every day | ORAL | 0 refills | Status: DC

## 2019-08-06 MED ORDER — OXYCODONE-ACETAMINOPHEN 5-325 MG PO TABS: 1.0000 | ORAL_TABLET | ORAL | 0 refills | Status: AC | PRN

## 2019-08-06 MED ORDER — QUETIAPINE FUMARATE 50 MG PO TABS: 50.0000 mg | ORAL_TABLET | Freq: Every day | ORAL | 0 refills | Status: DC

## 2019-08-06 NOTE — TOC Transition Note (Signed)
Transition of Care [facility]) - CM/SW Discharge Note   Patient Details  Name: Crixus Mcaulay MRN: [id number] Date of Birth: 1970-03-08  [facility] of [facility]) CM/SW Contact:  Ida Rogue, LCSW Phone Number: 08/06/2019, 2:59 PM   Clinical Narrative:  Patient was discharged today had orders for [facility] PT/OT. I had been working with Shanda Bumps in patient registration to identify whether or not patient had insurance as he stated he had purchased COBRA [employer] through his previous employer, but all research pulled up was dental coverage.  I called Clydie Braun with [employer] to make charity referral.  She correctly pointed out that patient had no PCP, and she would need that as well as an MD to sign notes prior to that.  I explained his insurance situation, and she stated she would need to talk to him about his finances to qualify him. She left me a message stating she had left a message for him.  In the meantime, I got him an appointment at [facility] health and Wellness clinic, and messaged Dr Rama about signing [facility] orders for him.  TOC sign off.    Final next level of care: Home/Self Care Barriers to Discharge: Barriers Resolved   Patient Goals and CMS Choice Patient states their goals for this hospitalization and ongoing recovery are:: unable to state at this time CMS Medicare.gov Compare Post Acute Care list provided to:: Patient    Discharge Placement                       Discharge Plan and Services   Discharge Planning Services: CM Consult                                 Social Determinants of Health (SDOH) Interventions     Readmission Risk Interventions No flowsheet data found.

## 2019-08-06 NOTE — Plan of Care (Signed)
Pt VS WNL this.  Resting comfortably and awaiting discharge.   Problem: Education: Goal: Knowledge of General Education information will improve Description: Including pain rating scale, medication(s)/side effects and non-pharmacologic comfort measures Outcome: Progressing   Problem: Health Behavior/Discharge Planning: Goal: Ability to manage health-related needs will improve Outcome: Progressing   Problem: Clinical Measurements: Goal: Ability to maintain clinical measurements within normal limits will improve Outcome: Progressing Goal: Will remain free from infection Outcome: Progressing Goal: Diagnostic test results will improve Outcome: Progressing Goal: Respiratory complications will improve Outcome: Progressing Goal: Cardiovascular complication will be avoided Outcome: Progressing   Problem: Activity: Goal: Risk for activity intolerance will decrease Outcome: Progressing   Problem: Nutrition: Goal: Adequate nutrition will be maintained Outcome: Progressing   Problem: Coping: Goal: Level of anxiety will decrease Outcome: Progressing   Problem: Elimination: Goal: Will not experience complications related to bowel motility Outcome: Progressing Goal: Will not experience complications related to urinary retention Outcome: Progressing   Problem: Pain Managment: Goal: General experience of comfort will improve Outcome: Progressing   Problem: Safety: Goal: Ability to remain free from injury will improve Outcome: Progressing   Problem: Skin Integrity: Goal: Risk for impaired skin integrity will decrease Outcome: Progressing   Problem: Education: Goal: Knowledge of Pancreatitis treatment and prevention will improve Outcome: Progressing   Problem: Health Behavior/Discharge Planning: Goal: Ability to formulate a plan to maintain an alcohol-free life will improve Outcome: Progressing   Problem: Nutritional: Goal: Ability to achieve adequate nutritional intake  will improve Outcome: Progressing   Problem: Clinical Measurements: Goal: Complications related to the disease process, condition or treatment will be avoided or minimized Outcome: Progressing   Problem: Education: Goal: Knowledge of Pancreatitis treatment and prevention will improve Outcome: Progressing   Problem: Health Behavior/Discharge Planning: Goal: Ability to formulate a plan to maintain an alcohol-free life will improve Outcome: Progressing   Problem: Nutritional: Goal: Ability to achieve adequate nutritional intake will improve Outcome: Progressing   Problem: Clinical Measurements: Goal: Complications related to the disease process, condition or treatment will be avoided or minimized Outcome: Progressing

## 2019-08-06 NOTE — Discharge Instructions (Signed)
Acute Pancreatitis  Acute pancreatitis happens when the pancreas gets swollen. The pancreas is a large gland in the body that helps to control blood sugar. It also makes enzymes that help to digest food. This condition can last a few days and cause serious problems. The lungs, heart, and kidneys may stop working. What are the causes? Causes include:  Alcohol abuse.  Drug abuse.  Gallstones.  A tumor in the pancreas. Other causes include:  Some medicines.  Some chemicals.  Diabetes.  An infection.  Damage caused by an accident.  The poison (venom) from a scorpion bite.  Belly (abdominal) surgery.  The body's defense system (immune system) attacking the pancreas (autoimmune pancreatitis).  Genes that are passed from parent to child (inherited). In some cases, the cause is not known. What are the signs or symptoms?  Pain in the upper belly that may be felt in the back. The pain may be very bad.  Swelling of the belly.  Feeling sick to your stomach (nauseous) and throwing up (vomiting).  Fever. How is this treated? You will likely have to stay in the hospital. Treatment may include:  Pain medicine.  Fluid through an IV tube.  Placing a tube in the stomach to take out the stomach contents. This may help you stop throwing up.  Not eating for 3-4 days.  Antibiotic medicines, if you have an infection.  Treating any other problems that may be the cause.  Steroid medicines, if your problem is caused by your defense system attacking your body's own tissues.  Surgery. Follow these instructions at home: Eating and drinking   Follow instructions from your doctor about what to eat and drink.  Eat foods that do not have a lot of fat in them.  Eat small meals often. Do not eat big meals.  Drink enough fluid to keep your pee (urine) pale yellow.  Do not drink alcohol if it caused your condition. Medicines  Take over-the-counter and prescription medicines only  as told by your doctor.  Ask your doctor if the medicine prescribed to you: ? Requires you to avoid driving or using heavy machinery. ? Can cause trouble pooping (constipation). You may need to take steps to prevent or treat trouble pooping:  Take over-the-counter or prescription medicines.  Eat foods that are high in fiber. These include beans, whole grains, and fresh fruits and vegetables.  Limit foods that are high in fat and sugar. These include fried or sweet foods. General instructions  Do not use any products that contain nicotine or tobacco, such as cigarettes, e-cigarettes, and chewing tobacco. If you need help quitting, ask your doctor.  Get plenty of rest.  Check your blood sugar at home as told by your doctor.  Keep all follow-up visits as told by your doctor. This is important. Contact a doctor if:  You do not get better as quickly as expected.  You have new symptoms.  Your symptoms get worse.  You have pain or weakness that lasts a long time.  You keep feeling sick to your stomach.  You get better and then you have pain again.  You have a fever. Get help right away if:  You cannot eat or keep fluids down.  Your pain gets very bad.  Your skin or the white part of your eyes turns yellow.  You have sudden swelling in your belly.  You throw up.  You feel dizzy or you pass out (faint).  Your blood sugar is high (over 300  mg/dL). Summary  Acute pancreatitis happens when the pancreas gets swollen.  This condition is often caused by alcohol abuse, drug abuse, or gallstones.  You will likely have to stay in the hospital for treatment. This information is not intended to replace advice given to you by your health care provider. Make sure you discuss any questions you have with your health care provider. Document Revised: 10/08/2017 Document Reviewed: 10/08/2017 Elsevier Patient Education  2020 [employer].  Alcohol Use Disorder Alcohol use disorder  is when your drinking disrupts your daily life. When you have this condition, you drink too much alcohol and you cannot control your drinking. Alcohol use disorder can cause serious problems with your physical health. It can affect your brain, heart, liver, pancreas, immune system, stomach, and intestines. Alcohol use disorder can increase your risk for certain cancers and cause problems with your mental health, such as depression, anxiety, psychosis, delirium, and dementia. People with this disorder risk hurting themselves and others. What are the causes? This condition is caused by drinking too much alcohol over time. It is not caused by drinking too much alcohol only one or two times. Some people with this condition drink alcohol to cope with or escape from negative life events. Others drink to relieve pain or symptoms of mental illness. What increases the risk? You are more likely to develop this condition if:  You have a family history of alcohol use disorder.  Your culture encourages drinking to the point of intoxication, or makes alcohol easy to get.  You had a mood or conduct disorder in childhood.  You have been a victim of abuse.  You are an adolescent and: ? You have poor grades or difficulties in school. ? Your caregivers do not talk to you about saying no to alcohol, or supervise your activities. ? You are impulsive or you have trouble with self-control. What are the signs or symptoms? Symptoms of this condition include:  Drinkingmore than you want to.  Drinking for longer than you want to.  Trying several times to drink less or to control your drinking.  Spending a lot of time getting alcohol, drinking, or recovering from drinking.  Craving alcohol.  Having problems at work, at school, or at home due to drinking.  Having problems in relationships due to drinking.  Drinking when it is dangerous to drink, such as before driving a car.  Continuing to drink even  though you know you might have a physical or mental problem related to drinking.  Needing more and more alcohol to get the same effect you want from the alcohol (building up tolerance).  Having symptoms of withdrawal when you stop drinking. Symptoms of withdrawal include: ? Fatigue. ? Nightmares. ? Trouble sleeping. ? Depression. ? Anxiety. ? Fever. ? Seizures. ? Severe confusion. ? Feeling or seeing things that are not there (hallucinations). ? Tremors. ? Rapid heart rate. ? Rapid breathing. ? High blood pressure.  Drinking to avoid symptoms of withdrawal. How is this diagnosed? This condition is diagnosed with an assessment. Your health care provider may start the assessment by asking three or four questions about your drinking. Your health care provider may perform a physical exam or do lab tests to see if you have physical problems resulting from alcohol use. She or he may refer you to a mental health professional for evaluation. How is this treated? Some people with alcohol use disorder are able to reduce their alcohol use to low-risk levels. Others need to completely quit  drinking alcohol. When necessary, mental health professionals with specialized training in substance use treatment can help. Your health care provider can help you decide how severe your alcohol use disorder is and what type of treatment you need. The following forms of treatment are available:  Detoxification. Detoxification involves quitting drinking and using prescription medicines within the first week to help lessen withdrawal symptoms. This treatment is important for people who have had withdrawal symptoms before and for heavy drinkers who are likely to have withdrawal symptoms. Alcohol withdrawal can be dangerous, and in severe cases, it can cause death. Detoxification may be provided in a home, community, or primary care setting, or in a hospital or substance use treatment facility.  Counseling. This  treatment is also called talk therapy. It is provided by substance use treatment counselors. A counselor can address the reasons you use alcohol and suggest ways to keep you from drinking again or to prevent problem drinking. The goals of talk therapy are to: ? Find healthy activities and ways for you to cope with stress. ? Identify and avoid the things that trigger your alcohol use. ? Help you learn how to handle cravings.  Medicines.Medicines can help treat alcohol use disorder by: ? Decreasing alcohol cravings. ? Decreasing the positive feeling you have when you drink alcohol. ? Causing an uncomfortable physical reaction when you drink alcohol (aversion therapy).  Support groups. Support groups are led by people who have quit drinking. They provide emotional support, advice, and guidance. These forms of treatment are often combined. Some people with this condition benefit from a combination of treatments provided by specialized substance use treatment centers. Follow these instructions at home:  Take over-the-counter and prescription medicines only as told by your health care provider.  Check with your health care provider before starting any new medicines.  Ask friends and family members not to offer you alcohol.  Avoid situations where alcohol is served, including gatherings where others are drinking alcohol.  Create a plan for what to do when you are tempted to use alcohol.  Find hobbies or activities that you enjoy that do not include alcohol.  Keep all follow-up visits as told by your health care provider. This is important. How is this prevented?  If you drink, limit alcohol intake to no more than 1 drink a day for nonpregnant women and 2 drinks a day for men. One drink equals 12 oz of beer, 5 oz of wine, or 1 oz of hard liquor.  If you have a mental health condition, get treatment and support.  Do not give alcohol to adolescents.  If you are an adolescent: ? Do not  drink alcohol. ? Do not be afraid to say no if someone offers you alcohol. Speak up about why you do not want to drink. You can be a positive role model for your friends and set a good example for those around you by not drinking alcohol. ? If your friends drink, spend time with others who do not drink alcohol. Make new friends who do not use alcohol. ? Find healthy ways to manage stress and emotions, such as meditation or deep breathing, exercise, spending time in nature, listening to music, or talking with a trusted friend or family member. Contact a health care provider if:  You are not able to take your medicines as told.  Your symptoms get worse.  You return to drinking alcohol (relapse) and your symptoms get worse. Get help right away if:  You have thoughts  about hurting yourself or others. If you ever feel like you may hurt yourself or others, or have thoughts about taking your own life, get help right away. You can go to your nearest emergency department or call:  Your local emergency services (911 in the [place]  A suicide crisis helpline, such as the National Suicide Prevention Lifeline at (662)284-3153. This is open 24 hours a day. Summary  Alcohol use disorder is when your drinking disrupts your daily life. When you have this condition, you drink too much alcohol and you cannot control your drinking.  Treatment may include detoxification, counseling, medicine, and support groups.  Ask friends and family members not to offer you alcohol. Avoid situations where alcohol is served.  Get help right away if you have thoughts about hurting yourself or others. This information is not intended to replace advice given to you by your health care provider. Make sure you discuss any questions you have with your health care provider. Document Revised: 12/01/2016 Document Reviewed: 09/16/2015 Elsevier Patient Education  2020 [employer].

## 2019-08-06 NOTE — Discharge Summary (Signed)
Physician Discharge Summary  Orlin Hildingodd Godwin RUE:[id number]:[id number] DOB: 25-Apr-1970 DOA: 07/23/2019  PCP: Patient, No Pcp Per  Admit date: 07/23/2019 Discharge date: 08/06/2019  Admitted From: Home Disposition: Home  Recommendations for Outpatient Follow-up:  1. Follow up with PCP in 1-2 weeks 2. Please obtain BMP/CBC in one week your next doctors visit.  3. Follow-up with GI in 4 to 6 weeks, may need outpatient repeat CT abdomen pelvis prior to follow-up.  This can be arranged by GI service outpatient 4. Pain control medication given 5. Seroquel 50 mg at bedtime 6. Thiamine, folic acid prescribed.  Over-the-counter multivitamin.  Discharge Condition: Stable CODE STATUS: Full code Diet recommendation: 2 g salt  Brief/Interim Summary: 49 year old with history of HTN, alcohol use, GERD, transaminitis presented with nausea vomiting abdominal pain.  Found to have acute pancreatitis with concerns of necrosis.  Underwent MRCP showing acute hemorrhagic necrotizing pancreatitis.  Was also undergoing alcohol withdrawal therefore was taken to the ICU placed on Precedex drip and eventually weaned off.  Due to recurrent fever started on meropenem initially and then transitioned off as fevers improved.  Today patient is doing much better, wants to go home.  Discharged in stable condition.   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 -Pain control, supportive care. -Slowly advance diet as tolerated, follow-up outpatient GI in 4 to 6 weeks, will need repeat CT abdomen at that time depending on his clinical course  Persistent fever secondary to necrotizing pancreatitis -Initially received meropenem, cultures remain negative.  Now remains afebrile therefore antibiotics have been stopped.  Will discharge patient with outpatient follow-up.  Mild pulmonary vascular congestion with some wheezing, improved -Secondary to aggressive hydration.  Echocardiogram is overall normal without wall motion abnormality.  Supportive care, diuretics as needed.  Alcohol abuse with delirium tremens Transaminitis -Initially on Precedex drip, improved. -Currently on alcohol withdrawal protocol, Ativan, thiamine, multivitamin and folic acid  Pancytopenia Iron deficiency anemia -Secondary to bone marrow suppression from alcohol use.   Substance use disorder -Seen by psychiatry.  Started Seroquel 50 mg bedtime.  CAD with drug-eluting stent -Currently chest pain-free.  Off aspirin due to hemorrhagic pancreatitis, slowly resume outpatient.   Body mass index is 26.26 kg/m.         Discharge Diagnoses:  Principal Problem:   Fever Active Problems:   Benign essential HTN   Acute pancreatitis   Intractable nausea and vomiting   Abdominal pain   GERD (gastroesophageal reflux disease)   Hiatal hernia   Elevated LFTs   Elevated troponin   Thrombocytopenia (HCC)   Delirium tremens (HCC)   Hypomagnesemia   Hypokalemia   Hypophosphatemia   Pleural effusion   GAD (generalized anxiety disorder)   Major depressive disorder, single episode, severe without psychosis (HCC)   Pancreatic necrosis      Consultations:  ID  GI  Subjective: Feels ok, no complaints. Wants to go home.   Discharge Exam:  Vitals:   08/06/19 0324 08/06/19 1029  BP: (!) 141/73 134/84  Pulse: 71 84  Resp: 16   Temp: 98.8 F (37.1 C)    SpO2: 98%    Vitals:   08/05/19 1943 08/05/19 2200 08/06/19 0324 08/06/19 1029  BP: (!) 163/99  (!) 141/73 134/84  Pulse: 71  71 84  Resp: 18  16   Temp: 100.2 F (37.9 C) 99.8 F (37.7 C) 98.8 F (37.1 C)   TempSrc: Oral Oral Oral   SpO2: 100%  98%   Weight:      Height:        General: Pt is alert, awake, not in acute distress Cardiovascular: RRR, S1/S2 +, no rubs, no gallops Respiratory: CTA bilaterally, no wheezing, no rhonchi Abdominal: Soft, NT, ND, bowel sounds + Extremities: no edema, no cyanosis  Discharge Instructions  Discharge Instructions    Face-to-face encounter (required for Medicare/Medicaid patients)   Complete by: As directed    I Abbegayle Denault Chirag Shaylea Ucci certify that this patient is under my care and that I, or a nurse practitioner or physician's assistant working with me, had a face-to-face encounter that meets the physician face-to-face encounter requirements with this patient on 08/06/2019. The encounter with the patient was in whole, or in part for the following medical condition(s) which is the primary reason for home health care genealized weakness.   The encounter with the patient was in whole, or in part, for the following medical condition, which is the primary reason for home health care: generalized weakness.   I certify that, based on my findings, the following services are medically necessary home health services: Physical therapy   Reason for Medically Necessary Home Health Services: Therapy- Therapeutic Exercises to Increase Strength and Endurance   My clinical findings support the need for the above services: Unsafe ambulation due to balance issues   Further, I certify that my clinical findings support that this patient is homebound due to: Ambulates short distances less than 300 feet   Home Health   Complete by: As directed    To provide the following care/treatments:  PT OT       Allergies as of 08/06/2019   No Known Allergies     Medication  List    TAKE these medications   acetaminophen 325 MG tablet Commonly known as: TYLENOL Take 650 mg by mouth every 6 (six) hours as needed for mild pain or headache.   aspirin 81 MG tablet Take 81 mg by mouth at bedtime.   busPIRone 10 MG tablet Commonly known as: BUSPAR Take 20 mg by mouth 2 (two) times daily.   clonazePAM 1 MG disintegrating tablet Commonly known as: KLONOPIN Take 1 mg by mouth 2 (two) times daily as needed for anxiety.   dexlansoprazole 60 MG capsule Commonly known as: DEXILANT Take 1 capsule (60 mg total) by mouth daily.   esomeprazole 40 MG packet Commonly known as: NexIUM Take 40 mg by mouth 2 (two) times daily before a meal. What changed:   when to take this  reasons to take this   Fluvoxamine Maleate 150 MG Cp24 Take 300 mg by mouth daily.   folic acid 1 MG tablet Commonly known as: FOLVITE Take 1 tablet (1 mg total) by mouth daily.   lisinopril 10 MG tablet Commonly known as: ZESTRIL Take 1 tablet (10 mg total) by mouth daily. What changed:   medication strength  how much to take   multivitamin with minerals Tabs tablet Take 1  tablet by mouth daily.   nitroGLYCERIN 0.4 MG SL tablet Commonly known as: NITROSTAT Place 0.4 mg under the tongue as needed for chest pain.   ondansetron 4 MG disintegrating tablet Commonly known as: Zofran ODT Take 1 tablet (4 mg total) by mouth every 8 (eight) hours as needed for nausea or vomiting.   oxyCODONE-acetaminophen 5-325 MG tablet Commonly known as: Percocet Take 1 tablet by mouth every 4 (four) hours as needed for up to 5 days for severe pain.   propranolol 40 MG tablet Commonly known as: INDERAL Take 0.5 tablets (20 mg total) by mouth daily.   QUEtiapine 50 MG tablet Commonly known as: SEROQUEL Take 1 tablet (50 mg total) by mouth at bedtime.   sucralfate 1 GM/10ML suspension Commonly known as: CARAFATE Take 10 mLs (1 g total) by mouth every 6 (six) hours as needed.   thiamine 100  MG tablet Take 1 tablet (100 mg total) by mouth daily.   topiramate 25 MG tablet Commonly known as: TOPAMAX Take 25 mg by mouth daily.   VITAMIN C PO Take 1 tablet by mouth daily.       Follow-up Information    [facility]. Schedule an appointment as soon as possible for a visit in 1 week(s).   Specialty: Gastroenterology Contact information: [address] [place] [address] [phone]       [facility]. Schedule an appointment as soon as possible for a visit on 08/27/2019.   Why: Wednesday at 9:10 for your hospital follow up appointment Contact information: [address] [place] [address] [phone]             No Known Allergies  You were cared for by a hospitalist during your hospital stay. If you have any questions about your discharge medications or the care you received while you were in the hospital after you are discharged, you can call the unit and asked to speak with the hospitalist on call if the hospitalist that took care of you is not available. Once you are discharged, your primary care physician will handle any further medical issues. Please note that no refills for any discharge medications will be authorized once you are discharged, as it is imperative that you return to your primary care physician (or establish a relationship with a primary care physician if you do not have one) for your aftercare needs so that they can reassess your need for medications and monitor your lab values.   Procedures/Studies: DG Chest 2 View  Result Date: 08/01/2019 CLINICAL DATA:  Fever EXAM: CHEST - 2 VIEW COMPARISON:  07/29/2019 FINDINGS: Heart is normal size. Bilateral lower lobe airspace opacities are noted, slightly improved since prior study. No visible effusions or pneumothorax. No acute bony abnormality. IMPRESSION: Persistent but improving bilateral lower lobe airspace  opacities. Electronically Signed   By: Charlett Nose M.D.   On: 08/01/2019 11:22   CT ABDOMEN PELVIS W CONTRAST  Result Date: 07/31/2019 CLINICAL DATA:  Follow-up of pancreatitis.  Abdominal pain. EXAM: CT ABDOMEN AND PELVIS WITH CONTRAST TECHNIQUE: Multidetector CT imaging of the abdomen and pelvis was performed using the standard protocol following bolus administration of intravenous contrast. CONTRAST:  OMNIPAQUE IOHEXOL 300 MG/ML  SOLN COMPARISON:  07/23/2019 FINDINGS: Lower chest: Motion degradation. New bibasilar airspace disease. Borderline cardiomegaly within LAD stent. Left circumflex coronary artery atherosclerosis. Small bilateral pleural effusions are unchanged. Hepatobiliary: Moderate hepatic steatosis, without focal liver lesion. Dependent hyperattenuation in the  gallbladder could represent sludge. No specific evidence of acute cholecystitis. Common duct difficult to follow. No evidence of biliary duct dilatation. Pancreas: Moderate peripancreatic fluid is increased. Areas of hypoenhancement, suspicious for necrosis identified within the head/uncinate process on 50/2 and tail on 39/2. These are similar in distribution to on the prior, slightly more well-defined today. Moderate peripancreatic fluid, most well-defined adjacent the tail, including at 7.3 x 6.4 cm on 38/2. Spleen: Normal in size, without focal abnormality. Adrenals/Urinary Tract: Normal adrenal glands. Normal kidneys, without hydronephrosis. Normal urinary bladder. Stomach/Bowel: Normal stomach, without wall thickening. The splenic flexure and descending colon are mildly thick walled, including on 44/2. Suspect right-sided colonic wall thickening on 52/2. Normal small bowel caliber. Vascular/Lymphatic: Aortic atherosclerosis. Patent portal and splenic veins. There are gastroepiploic collaterals, suggesting splenic vein insufficiency. Example 47/2. No abdominopelvic adenopathy. Reproductive: Normal prostate. Other: Intraperitoneal  fluid is small volume, slightly increased. Minimal cul-de-sac fluid is similar. No free intraperitoneal air. Development of anasarca. Musculoskeletal: No acute osseous abnormality. IMPRESSION: 1. Since 07/23/2019, increase in moderate peripancreatic fluid, consistent with acute pancreatitis. Areas of hypoenhancement within the pancreatic head/uncinate process are consistent with a involving necrosis. Well-defined fluid adjacent the pancreatic tail, for which developing pseudocyst cannot be excluded. 2. Increase in small volume abdominal pelvic ascites and anasarca. 3. New bibasilar airspace disease, favoring atelectasis. 4. Similar small bilateral pleural effusions. 5. Hepatic steatosis. 6. Left and possible right-sided colonic wall thickening indicative of colitis. This could be secondary to peripancreatic inflammation. 7. 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 No acute portal or splenic vein thrombus. 8. Aortic Atherosclerosis (ICD10-I70.0). Electronically Signed   By: Jeronimo Greaves M.D.   On: 07/31/2019 12:30   CT ABDOMEN PELVIS W CONTRAST  Result Date: 07/23/2019 CLINICAL DATA:  Nausea and vomiting. EXAM: CT ABDOMEN AND PELVIS WITH CONTRAST TECHNIQUE: Multidetector CT imaging of the abdomen and pelvis was performed using the standard protocol following bolus administration of intravenous contrast. CONTRAST:  OMNIPAQUE IOHEXOL 300 MG/ML  SOLN COMPARISON:  None. FINDINGS: Lower chest: The lung bases are clear. The heart size is normal. Hepatobiliary: There is decreased hepatic attenuation suggestive of hepatic steatosis. Normal gallbladder.There is no biliary ductal dilation. Pancreas: There is diffuse peripancreatic fat stranding and free fluid. Approximately 4.6 cm of the pancreatic body and tail is hypoenhancing. Additionally, there is a heterogeneous 2.6 cm area of the pancreatic head and uncinate process that is hypoenhancing. There is no well-formed drainable fluid  collection. Spleen: Unremarkable. Adrenals/Urinary Tract: --Adrenal glands: Unremarkable. --Right kidney/ureter: No hydronephrosis or radiopaque kidney stones. --Left kidney/ureter: No hydronephrosis or radiopaque kidney stones. --Urinary bladder: Unremarkable. Stomach/Bowel: --Stomach/Duodenum: No hiatal hernia or other gastric abnormality. Normal duodenal course and caliber. --Small bowel: Unremarkable. --Colon: Unremarkable. --Appendix: Not visualized. No right lower quadrant inflammation or free fluid. Vascular/Lymphatic: Normal course and caliber of the major abdominal vessels. --No retroperitoneal lymphadenopathy. --No mesenteric lymphadenopathy. --No pelvic or inguinal lymphadenopathy. Reproductive: Unremarkable Other: There is free fluid in the abdomen and pelvis the abdominal wall is normal. Musculoskeletal. No acute displaced fractures. IMPRESSION: 1. Findings consistent with acute pancreatitis. There are hypoenhancing areas of the pancreatic tail and head concerning for developing areas of pancreatic necrosis. 2. Hepatic steatosis. 3. Small volume free fluid in the abdomen and pelvis. Electronically Signed   By: Katherine Mantle M.D.   On: 07/23/2019 23:32   MR 3D Recon At Scanner  Result Date: 07/25/2019 CLINICAL DATA:  Inpatient. Severe acute pancreatitis with elevated liver function tests. EXAM: MRI ABDOMEN WITHOUT  AND WITH CONTRAST (INCLUDING MRCP) TECHNIQUE: Multiplanar multisequence MR imaging of the abdomen was performed both before and after the administration of intravenous contrast. Heavily T2-weighted images of the biliary and pancreatic ducts were obtained, and three-dimensional MRCP images were rendered by post processing. CONTRAST:  8mL GADAVIST GADOBUTROL 1 MMOL/ML IV SOLN COMPARISON:  07/23/2019 CT abdomen/pelvis. FINDINGS: Lower chest: Small dependent bilateral pleural effusions, new. Patulous lower thoracic esophagus with air-fluid level suggesting esophageal dysmotility and/or  gastroesophageal reflux. Hepatobiliary: Top-normal liver size. No definite liver surface irregularity. Prominent diffuse hepatic steatosis. No liver mass. Normal gallbladder with no cholelithiasis. No biliary ductal dilatation. Common bile duct diameter 2 mm. No choledocholithiasis. No biliary masses, strictures or beading. Pancreas: Diffusely thickened and edematous pancreatic parenchyma with marked diffuse peripancreatic fat stranding and ill-defined fluid extending into anterior and posterior paranephric retroperitoneal spaces bilaterally, compatible with acute pancreatitis. There are large regions of the pancreatic head and tail demonstrating patchy T1 hyperintensity and absence of enhancement, compatible with hemorrhagic necrotizing pancreatitis, involving approximately 50% of the pancreatic parenchyma. No pancreatic duct dilation. No discrete pancreatic mass. No measurable peripancreatic fluid collections. Spleen: Normal size. No mass. Adrenals/Urinary Tract: Normal adrenals. No hydronephrosis. Normal kidneys with no renal mass. Stomach/Bowel: Small hiatal hernia. Otherwise normal nondistended stomach. Visualized small and large bowel is normal caliber, with no bowel wall thickening. Vascular/Lymphatic: Normal caliber abdominal aorta. Patent portal, splenic, hepatic and renal veins. No pathologically enlarged lymph nodes in the abdomen. Other: Small volume abdominal ascites.  No focal fluid collection. Musculoskeletal: No aggressive appearing focal osseous lesions. IMPRESSION: 1. Acute hemorrhagic necrotizing pancreatitis involving approximately 50% of the pancreatic parenchyma. No discrete measurable peripancreatic fluid collections. 2. No biliary ductal dilatation. No cholelithiasis or choledocholithiasis. 3. Prominent diffuse hepatic steatosis. 4. Small dependent bilateral pleural effusions, new. Small volume abdominal ascites. 5. Patulous lower thoracic esophagus with air-fluid level suggesting esophageal  dysmotility and/or gastroesophageal reflux. Electronically Signed   By: Delbert Phenix M.D.   On: 07/25/2019 07:54   DG Chest Port 1 View  Result Date: 07/29/2019 CLINICAL DATA:  Respiratory distress. EXAM: PORTABLE CHEST 1 VIEW COMPARISON:  Radiograph 07/23/2019. FINDINGS: 0929 hours. The heart size is at the upper limits of normal for portable AP technique. There are lower lung volumes with new vascular congestion and probable edema. There are small bilateral pleural effusions with left-greater-than-right basilar airspace opacities. No pneumothorax. The bones appear intact. Telemetry leads overlie the chest. IMPRESSION: Lower lung volumes with new vascular congestion, probable edema and small bilateral pleural effusions suggesting congestive heart failure. Bibasilar airspace opacities may reflect atelectasis or pneumonia. Electronically Signed   By: Carey Bullocks M.D.   On: 07/29/2019 09:52   DG Chest Portable 1 View  Result Date: 07/23/2019 CLINICAL DATA:  Vomiting, epigastric pain EXAM: PORTABLE CHEST 1 VIEW COMPARISON:  None. FINDINGS: Lungs are clear. No pneumothorax or pleural effusion. Coronary artery stenting has been performed. Cardiac size within normal limits. The pulmonary vascularity is normal. No acute bone abnormality. IMPRESSION: No active disease. Electronically Signed   By: Helyn Numbers MD   On: 07/23/2019 23:06   MR ABDOMEN MRCP W WO CONTAST  Result Date: 07/25/2019 CLINICAL DATA:  Inpatient. Severe acute pancreatitis with elevated liver function tests. EXAM: MRI ABDOMEN WITHOUT AND WITH CONTRAST (INCLUDING MRCP) TECHNIQUE: Multiplanar multisequence MR imaging of the abdomen was performed both before and after the administration of intravenous contrast. Heavily T2-weighted images of the biliary and pancreatic ducts were obtained, and three-dimensional MRCP images were rendered by post  processing. CONTRAST:  75mL GADAVIST GADOBUTROL 1 MMOL/ML IV SOLN COMPARISON:  07/23/2019 CT  abdomen/pelvis. FINDINGS: Lower chest: Small dependent bilateral pleural effusions, new. Patulous lower thoracic esophagus with air-fluid level suggesting esophageal dysmotility and/or gastroesophageal reflux. Hepatobiliary: Top-normal liver size. No definite liver surface irregularity. Prominent diffuse hepatic steatosis. No liver mass. Normal gallbladder with no cholelithiasis. No biliary ductal dilatation. Common bile duct diameter 2 mm. No choledocholithiasis. No biliary masses, strictures or beading. Pancreas: Diffusely thickened and edematous pancreatic parenchyma with marked diffuse peripancreatic fat stranding and ill-defined fluid extending into anterior and posterior paranephric retroperitoneal spaces bilaterally, compatible with acute pancreatitis. There are large regions of the pancreatic head and tail demonstrating patchy T1 hyperintensity and absence of enhancement, compatible with hemorrhagic necrotizing pancreatitis, involving approximately 50% of the pancreatic parenchyma. No pancreatic duct dilation. No discrete pancreatic mass. No measurable peripancreatic fluid collections. Spleen: Normal size. No mass. Adrenals/Urinary Tract: Normal adrenals. No hydronephrosis. Normal kidneys with no renal mass. Stomach/Bowel: Small hiatal hernia. Otherwise normal nondistended stomach. Visualized small and large bowel is normal caliber, with no bowel wall thickening. Vascular/Lymphatic: Normal caliber abdominal aorta. Patent portal, splenic, hepatic and renal veins. No pathologically enlarged lymph nodes in the abdomen. Other: Small volume abdominal ascites.  No focal fluid collection. Musculoskeletal: No aggressive appearing focal osseous lesions. IMPRESSION: 1. Acute hemorrhagic necrotizing pancreatitis involving approximately 50% of the pancreatic parenchyma. No discrete measurable peripancreatic fluid collections. 2. No biliary ductal dilatation. No cholelithiasis or choledocholithiasis. 3. Prominent diffuse  hepatic steatosis. 4. Small dependent bilateral pleural effusions, new. Small volume abdominal ascites. 5. Patulous lower thoracic esophagus with air-fluid level suggesting esophageal dysmotility and/or gastroesophageal reflux. Electronically Signed   By: Delbert Phenix M.D.   On: 07/25/2019 07:54   ECHOCARDIOGRAM COMPLETE  Result Date: 08/01/2019    ECHOCARDIOGRAM REPORT   Patient Name:   Gary Atkinson Date of Exam: 08/01/2019 Medical Rec #:  [id number]    Height:       71.0 in Accession #:    [id number]   Weight:       186.7 lb Date of Birth:  1970-10-31    BSA:          2.048 m Patient Age:    49 years     BP:           157/72 mmHg Patient Gender: M            HR:           98 bpm. Exam Location:  Inpatient Procedure: 2D Echo Indications:    Dyspnea R06.00  History:        Patient has prior history of Echocardiogram examinations, most                 recent 08/25/2013. CAD and Previous Myocardial Infarction; Risk                 Factors:Hypertension and Dyslipidemia.  Sonographer:    Thurman Coyer RDCS (AE) Referring Phys: Oliver Hum VIJAYA AKULA IMPRESSIONS  1. Left ventricular ejection fraction, by estimation, is 60 to 65%. The left ventricle has normal function. The left ventricle has no regional wall motion abnormalities. Left ventricular diastolic parameters were normal.  2. Right ventricular systolic function is normal. The right ventricular size is normal. Tricuspid regurgitation signal is inadequate for assessing PA pressure.  3. The mitral valve is normal in structure. No evidence of mitral valve regurgitation. No evidence of mitral stenosis.  4. The aortic valve is normal in structure.  Aortic valve regurgitation is mild. No aortic stenosis is present.  5. The inferior vena cava is normal in size with greater than 50% respiratory variability, suggesting right atrial pressure of 3 mmHg. FINDINGS  Left Ventricle: Left ventricular ejection fraction, by estimation, is 60 to 65%. The left ventricle has normal  function. The left ventricle has no regional wall motion abnormalities. The left ventricular internal cavity size was normal in size. There is  no left ventricular hypertrophy. Left ventricular diastolic parameters were normal. Normal left ventricular filling pressure. Right Ventricle: The right ventricular size is normal. No increase in right ventricular wall thickness. Right ventricular systolic function is normal. Tricuspid regurgitation signal is inadequate for assessing PA pressure. Left Atrium: Left atrial size was normal in size. Right Atrium: Right atrial size was normal in size. Pericardium: There is no evidence of pericardial effusion. Mitral Valve: The mitral valve is normal in structure. Normal mobility of the mitral valve leaflets. No evidence of mitral valve regurgitation. No evidence of mitral valve stenosis. Tricuspid Valve: The tricuspid valve is normal in structure. Tricuspid valve regurgitation is trivial. No evidence of tricuspid stenosis. Aortic Valve: The aortic valve is normal in structure. Aortic valve regurgitation is mild. No aortic stenosis is present. Pulmonic Valve: The pulmonic valve was normal in structure. Pulmonic valve regurgitation is not visualized. No evidence of pulmonic stenosis. Aorta: The aortic root is normal in size and structure. Venous: The inferior vena cava is normal in size with greater than 50% respiratory variability, suggesting right atrial pressure of 3 mmHg. IAS/Shunts: No atrial level shunt detected by color flow Doppler.  LEFT VENTRICLE PLAX 2D LVIDd:         5.00 cm  Diastology LVIDs:         3.20 cm  LV e' lateral:   13.50 cm/s LV PW:         1.10 cm  LV E/e' lateral: 8.1 LV IVS:        0.90 cm  LV e' medial:    8.38 cm/s LVOT diam:     2.10 cm  LV E/e' medial:  13.0 LV SV:         95 LV SV Index:   46 LVOT Area:     3.46 cm  RIGHT VENTRICLE RV S prime:     17.10 cm/s TAPSE (M-mode): 2.3 cm LEFT ATRIUM             Index       RIGHT ATRIUM           Index LA  diam:        3.70 cm 1.81 cm/m  RA Area:     14.60 cm LA Vol (A2C):   47.9 ml 23.39 ml/m RA Volume:   31.70 ml  15.48 ml/m LA Vol (A4C):   39.9 ml 19.48 ml/m LA Biplane Vol: 44.7 ml 21.83 ml/m  AORTIC VALVE LVOT Vmax:   161.00 cm/s LVOT Vmean:  98.200 cm/s LVOT VTI:    0.274 m  AORTA Ao Root diam: 3.60 cm MITRAL VALVE MV Area (PHT): 3.16 cm     SHUNTS MV Decel Time: 240 msec     Systemic VTI:  0.27 m MV E velocity: 109.00 cm/s  Systemic Diam: 2.10 cm MV A velocity: 87.70 cm/s MV E/A ratio:  1.24 Armanda Magic MD Electronically signed by Armanda Magic MD Signature Date/Time: 08/01/2019/9:28:17 AM    Final    VAS [place] UPPER EXTREMITY VENOUS DUPLEX  Result Date: 07/31/2019 UPPER  VENOUS STUDY  Indications: Pain and swelling in upper arm. Comparison Study: No prior studies. Performing Technologist: Jean Rosenthal  Examination Guidelines: A complete evaluation includes B-mode imaging, spectral Doppler, color Doppler, and power Doppler as needed of all accessible portions of each vessel. Bilateral testing is considered an integral part of a complete examination. Limited examinations for reoccurring indications may be performed as noted.  Right Findings: +----------+------------+---------+-----------+----------+-------+ RIGHT     CompressiblePhasicitySpontaneousPropertiesSummary +----------+------------+---------+-----------+----------+-------+ Subclavian    Full       Yes       Yes                      +----------+------------+---------+-----------+----------+-------+  Left Findings: +----------+------------+---------+-----------+----------+-------+ LEFT      CompressiblePhasicitySpontaneousPropertiesSummary +----------+------------+---------+-----------+----------+-------+ IJV           Full       Yes       Yes                      +----------+------------+---------+-----------+----------+-------+ Subclavian    Full       Yes       Yes                       +----------+------------+---------+-----------+----------+-------+ Axillary      Full       Yes       Yes                      +----------+------------+---------+-----------+----------+-------+ Brachial      Full                                          +----------+------------+---------+-----------+----------+-------+ Radial        Full                                          +----------+------------+---------+-----------+----------+-------+ Ulnar         Full                                          +----------+------------+---------+-----------+----------+-------+ Cephalic    Partial                                  Acute  +----------+------------+---------+-----------+----------+-------+ Basilic       Full                                          +----------+------------+---------+-----------+----------+-------+  Summary:  Right: No evidence of thrombosis in the subclavian.  Left: No evidence of deep vein thrombosis in the upper extremity. Findings consistent with acute superficial vein thrombosis involving the left cephalic vein.  *See table(s) above for measurements and observations.  Diagnosing physician: Sherald Hess MD Electronically signed by Sherald Hess MD on 07/31/2019 at 3:46:14 PM.    Final    US Abdomen Limited RUQ  Result Date: 07/25/2019 CLINICAL DATA:  Pancreatitis. EXAM: ULTRASOUND ABDOMEN LIMITED RIGHT UPPER QUADRANT COMPARISON:  Abdominal MRI 07/24/2019 FINDINGS: Gallbladder: No gallstones  or wall thickening visualized. No sonographic Murphy sign noted by sonographer. Common bile duct: Diameter: 6 mm Liver: Diffusely increased parenchymal echogenicity without a focal lesion identified. Portal vein is patent on color Doppler imaging with normal direction of blood flow towards the liver. Other: Small volume perihepatic ascites and right pleural effusion. IMPRESSION: 1. Hepatic steatosis. 2. No gallstones or biliary dilatation. 3. Small  volume perihepatic ascites and right pleural effusion. Electronically Signed   By: Sebastian Ache M.D.   On: 07/25/2019 09:22      The results of significant diagnostics from this hospitalization (including imaging, microbiology, ancillary and laboratory) are listed below for reference.     Microbiology: Recent Results (from the past 240 hour(s))  Culture, blood (Routine X 2) w Reflex to ID Panel     Status: None   Collection Time: 08/01/19 11:31 AM   Specimen: BLOOD RIGHT HAND  Result Value Ref Range Status   Specimen Description   Final    BLOOD RIGHT HAND Performed at [facility], [address] [place], [place] [address]    Special Requests   Final    BOTTLES DRAWN AEROBIC ONLY Blood Culture adequate volume Performed at [facility], [address] [place], [place] [address]    Culture   Final    NO GROWTH 5 DAYS Performed at [facility], [address] [place], [place] [address]    Report Status 08/06/2019 FINAL  Final  Culture, blood (Routine X 2) w Reflex to ID Panel     Status: None   Collection Time: 08/01/19 11:40 AM   Specimen: BLOOD LEFT HAND  Result Value Ref Range Status   Specimen Description   Final    BLOOD LEFT HAND Performed at [facility], [address], [place], [place] [address]    Special Requests   Final    BOTTLES DRAWN AEROBIC AND ANAEROBIC Blood Culture adequate volume Performed at [facility], [address] [place], [place] [address]    Culture   Final    NO GROWTH 5 DAYS Performed at [facility], [address], [place], [place] [address]    Report Status 08/06/2019 FINAL  Final  Culture, blood (Routine X 2) w Reflex to ID Panel     Status: None (Preliminary result)   Collection Time: 08/04/19 12:22 PM   Specimen: BLOOD LEFT HAND  Result Value Ref Range Status   Specimen Description   Final    BLOOD LEFT HAND Performed at [facility], [address] [place], [place] [address]    Special Requests   Final    BOTTLES DRAWN AEROBIC ONLY Blood Culture results may not be optimal due to an inadequate volume of blood received in culture bottles Performed at [facility], [address] [place], [place] [address]    Culture   Final    NO GROWTH 2 DAYS Performed at [facility], [address], [place], [place] [address]    Report Status PENDING  Incomplete  Culture, blood (Routine X 2) w Reflex to ID Panel     Status: None (Preliminary result)   Collection Time: 08/04/19  1:06 PM   Specimen: BLOOD LEFT HAND  Result Value Ref Range Status   Specimen Description   Final    BLOOD LEFT HAND Performed at [facility], [address] [place], [place] [address]    Special Requests  Final    BOTTLES DRAWN AEROBIC ONLY Blood Culture adequate volume Performed at [facility], [address], [place], [place] [address]    Culture   Final    NO GROWTH 2 DAYS Performed at [facility], [address] [place], [place] [address]    Report Status PENDING  Incomplete     Labs: BNP (last 3 results) No results for input(s): BNP in the last 8760 hours. Basic Metabolic Panel: Recent Labs  Lab 08/01/19 0207 08/01/19 0425 08/02/19 0354 08/03/19 0414 08/04/19 0433 08/04/19 0804 08/05/19 0351 08/06/19 0358  NA   < >  --  134* 134*  --  135 134* 137  K   < >  --  3.2* 3.5  --  3.4* 4.2 3.5  CL   < >  --  98 101  --  101 98 103  CO2   < >  --  22 26  --  22 21* 22  GLUCOSE   < >  --  94 89  --  97 80 91  BUN   < >  --  <5* <5*  --  <5* <5* <5*  CREATININE   < >  --  0.54* 0.60*  --  0.61 0.73 0.57*  CALCIUM   < >  --  7.3* 7.5*  --  7.7* 8.2* 7.9*  MG  --  1.7 1.9 1.8 1.7  --  2.0  --   PHOS  --  3.0 4.1 3.9 3.5  --  4.3  --    < > = values in this interval not displayed.   Liver Function Tests: Recent Labs  Lab 08/01/19 0207 08/02/19 0354  08/03/19 0414 08/04/19 0804 08/06/19 0358  AST 50* 52* 36 31 29  ALT 76* 63* 48* 40 33  ALKPHOS 102 107 103 97 92  BILITOT 0.6 0.7 0.8 0.8 0.8  PROT 5.0* 5.1* 5.3* 5.3* 5.5*  ALBUMIN 1.9* 2.0* 2.1* 1.9* 1.9*   Recent Labs  Lab 08/01/19 0207 08/02/19 0354 08/03/19 0414 08/04/19 0433 08/05/19 0351  LIPASE 37 38 28 26 26    No results for input(s): AMMONIA in the last 168 hours. CBC: Recent Labs  Lab 07/31/19 1500 08/01/19 0207 08/03/19 0414 08/04/19 0804 08/06/19 0358  WBC 7.7 9.4 8.3 7.5 6.0  NEUTROABS 5.9  --   --  5.9  --   HGB 10.1* 9.8* 8.8* 8.6* 8.3*  HCT 30.8* 29.0* 26.2* 26.1* 25.0*  MCV 96.6 93.9 95.3 96.0 96.5  PLT 251 212 259 312 381   Cardiac Enzymes: No results for input(s): CKTOTAL, CKMB, CKMBINDEX, TROPONINI in the last 168 hours. BNP: Invalid input(s): POCBNP CBG: No results for input(s): GLUCAP in the last 168 hours. D-Dimer No results for input(s): DDIMER in the last 72 hours. Hgb A1c No results for input(s): HGBA1C in the last 72 hours. Lipid Profile No results for input(s): CHOL, HDL, LDLCALC, TRIG, CHOLHDL, LDLDIRECT in the last 72 hours. Thyroid function studies No results for input(s): TSH, T4TOTAL, T3FREE, THYROIDAB in the last 72 hours.  Invalid input(s): FREET3 Anemia work up No results for input(s): VITAMINB12, FOLATE, FERRITIN, TIBC, IRON, RETICCTPCT in the last 72 hours. Urinalysis    Component Value Date/Time   COLORURINE COLORLESS (A) 08/01/2019 1122   APPEARANCEUR CLEAR 08/01/2019 1122   LABSPEC 1.000 (L) 08/01/2019 1122   PHURINE 7.0 08/01/2019 1122   GLUCOSEU NEGATIVE 08/01/2019 1122   HGBUR NEGATIVE 08/01/2019 1122   BILIRUBINUR NEGATIVE  08/01/2019 1122   KETONESUR NEGATIVE 08/01/2019 1122   PROTEINUR NEGATIVE 08/01/2019 1122   NITRITE NEGATIVE 08/01/2019 1122   LEUKOCYTESUR NEGATIVE 08/01/2019 1122   Sepsis Labs Invalid input(s): PROCALCITONIN,  WBC,  LACTICIDVEN Microbiology Recent Results (from the past 240  hour(s))  Culture, blood (Routine X 2) w Reflex to ID Panel     Status: None   Collection Time: 08/01/19 11:31 AM   Specimen: BLOOD RIGHT HAND  Result Value Ref Range Status   Specimen Description   Final    BLOOD RIGHT HAND Performed at [facility], [address], [place], [place] [address]    Special Requests   Final    BOTTLES DRAWN AEROBIC ONLY Blood Culture adequate volume Performed at [facility], [address] [place], [place] [address]    Culture   Final    NO GROWTH 5 DAYS Performed at [facility], [address], [place], [place] [address]    Report Status 08/06/2019 FINAL  Final  Culture, blood (Routine X 2) w Reflex to ID Panel     Status: None   Collection Time: 08/01/19 11:40 AM   Specimen: BLOOD LEFT HAND  Result Value Ref Range Status   Specimen Description   Final    BLOOD LEFT HAND Performed at [facility], [address], [place], [place] [address]    Special Requests   Final    BOTTLES DRAWN AEROBIC AND ANAEROBIC Blood Culture adequate volume Performed at [facility], [address] [place], [place] [address]    Culture   Final    NO GROWTH 5 DAYS Performed at [facility], [address] [place], [place] [address]    Report Status 08/06/2019 FINAL  Final  Culture, blood (Routine X 2) w Reflex to ID Panel     Status: None (Preliminary result)   Collection Time: 08/04/19 12:22 PM   Specimen: BLOOD LEFT HAND  Result Value Ref Range Status   Specimen Description   Final    BLOOD LEFT HAND Performed at [facility] [address] [place], [place] [address]    Special Requests   Final    BOTTLES DRAWN AEROBIC ONLY Blood Culture results may not be optimal due to an inadequate volume of blood received in culture bottles Performed at [facility] [address], [place], [place] [address]    Culture   Final    NO GROWTH  2 DAYS Performed at [facility], [address], [place], [place] [address]    Report Status PENDING  Incomplete  Culture, blood (Routine X 2) w Reflex to ID Panel     Status: None (Preliminary result)   Collection Time: 08/04/19  1:06 PM   Specimen: BLOOD LEFT HAND  Result Value Ref Range Status   Specimen Description   Final    BLOOD LEFT HAND Performed at [facility], [address], [place], [place] [address]    Special Requests   Final    BOTTLES DRAWN AEROBIC ONLY Blood Culture adequate volume Performed at [facility], [address], [place], [place] [address]    Culture   Final    NO GROWTH 2 DAYS Performed at [facility], [address], [place], [place] [address]    Report Status PENDING  Incomplete     Time coordinating discharge:  I have spent 35 minutes face to  face with the patient and on the ward discussing the patients care, assessment, plan and disposition with other care givers. >50% of the time was devoted counseling the patient about the risks and benefits of treatment/Discharge disposition and coordinating care.   SIGNED:   Dimple Nanas, MD  [facility] 08/06/2019, 12:57 PM   If 7PM-7AM, please contact night-coverage

## 2019-08-06 NOTE — Progress Notes (Signed)
Pt VS WNL.  Discharge instructions provided to and reviewed with pt.  PIV discontinued.  Catheter intact and clotting within expected timeframe.  Pt transported via wheelchair to front lobby by 5W staff.

## 2019-08-09 LAB — CULTURE, BLOOD (ROUTINE X 2)
Culture: NO GROWTH
Culture: NO GROWTH
Special Requests: ADEQUATE

## 2019-08-25 NOTE — Progress Notes (Signed)
Patient ID: Gary Atkinson, male   DOB: 1970/10/09, 49 y.o.   MRN: [id number]    Virtual Visit via Telephone Note  I connected with Gary Atkinson on 08/27/19 at  9:10 AM EDT by telephone and verified that I am speaking with the correct person using two identifiers.   I discussed the limitations, risks, security and privacy concerns of performing an evaluation and management service by telephone and the availability of in person appointments. I also discussed with the patient that there may be a patient responsible charge related to this service. The patient expressed understanding and agreed to proceed.  PATIENT visit by telephone virtually in the context of Covid-19 pandemic. Patient location:  home My Location:  [facility] office Persons on the call:  Me and the patient   History of Present Illness: After hospitalization 7/21-08/06/2019.  Patient not feeling any better.  Still has temps up to 102.  Weighing about 135.  Still with abdominal pain.  He has an appt with GI today.   Previously had a PCP in [place] but desires establishing in [place].  Taking zofran for nausea.  No active vomiting.  Not drinking any alcohol.    From discharge summary: Recommendations for Outpatient Follow-up:  1. Follow up with PCP in 1-2 weeks 2. Please obtain BMP/CBC in one week your next doctors visit.  3. Follow-up with GI in 4 to 6 weeks, may need outpatient repeat CT abdomen pelvis prior to follow-up.  This can be arranged by GI service outpatient 4. Pain control medication given 5. Seroquel 50 mg at bedtime 6. Thiamine, folic acid prescribed.  Over-the-counter multivitamin.  Discharge Condition: Stable CODE STATUS: Full code Diet recommendation: 2 g salt  Brief/Interim Summary: 49 year old with history of HTN, alcohol use, GERD, transaminitis presented with nausea vomiting abdominal pain. Found to have acute pancreatitis with concerns of necrosis. Underwent MRCP showing acute hemorrhagic  necrotizing pancreatitis. Was also undergoing alcohol withdrawal therefore was taken to the ICU placed on Precedex drip and eventually weaned off. Due to recurrent fever started on meropenem initially and then transitioned off as fevers improved.  Today patient is doing much better, wants to go home.  Discharged in stable condition.   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 -Pain control, supportive care. -Slowly advance diet as tolerated, follow-up outpatient GI in 4 to 6 weeks, will need repeat CT abdomen at that time depending on his clinical course  Persistent fever secondary to necrotizing pancreatitis -Initially received meropenem, cultures remain negative.  Now remains afebrile therefore antibiotics have been stopped.  Will discharge patient with outpatient follow-up.  Mild pulmonary vascular congestion with some wheezing, improved -Secondary to aggressive hydration. Echocardiogram is overall normal without wall motion abnormality. Supportive care,diuretics as needed.  Alcohol abuse with delirium tremens Transaminitis -Initially on Precedex drip, improved. -Currently on alcohol withdrawal protocol, Ativan, thiamine, multivitamin and folic acid  Pancytopenia Iron deficiency anemia -Secondary to bone marrow suppression from alcohol use.   Substance use disorder -Seen by psychiatry.  Started Seroquel 50 mg bedtime.  CAD with drug-eluting stent -Currently chest pain-free. Off aspirin due to hemorrhagic pancreatitis, slowly resume  outpatient.   Body mass index is 26.26 kg/m.     Discharge Diagnoses:  Principal Problem:   Fever Active Problems:   Benign essential HTN   Acute pancreatitis   Intractable nausea and vomiting   Abdominal pain   GERD (gastroesophageal reflux disease)   Hiatal hernia   Elevated LFTs   Elevated troponin   Thrombocytopenia (HCC)   Delirium tremens (HCC)   Hypomagnesemia   Hypokalemia   Hypophosphatemia   Pleural effusion   GAD (generalized anxiety disorder)   Major depressive disorder, single episode, severe without psychosis (HCC)   Pancreatic necrosis   Observations/Objective: NAD.  A&Ox3  Assessment and Plan: 1. Hospital discharge follow-up   2. Alcohol-induced acute pancreatitis with uninfected necrosis I have counseled the patient at length about substance abuse and addiction.  12 step meetings/recovery recommended.  Local 12 step meeting lists were given and attendance was encouraged.  Patient expresses understanding.    3. Fever, unspecified fever cause Sees GI this afternoon-may need re-admission.  I advised him to go in person as he has not heard otherwise regarding visit and it sounds as though he is not doing well.    4. Intractable nausea and vomiting Sees GI this afternoon-may need re-admission   Follow Up Instructions: appt to assign PCP 10/13/2019   I discussed the assessment and treatment plan with the patient. The patient was provided an opportunity to ask questions and all were answered. The patient agreed with the plan and demonstrated an understanding of the instructions.   The patient was advised to call back or seek an in-person evaluation if the symptoms worsen or if the condition fails to improve as anticipated.  I provided 15 minutes of non-face-to-face time during this  encounter.   Georgian Co, PA-C

## 2019-08-27 ENCOUNTER — Encounter: Payer: Self-pay | Admitting: Gastroenterology

## 2019-08-27 ENCOUNTER — Inpatient Hospital Stay (HOSPITAL_COMMUNITY)
Admission: EM | Admit: 2019-08-27 | Discharge: 2019-09-11 | DRG: 439 | Disposition: A | Discharge status: Home / Self Care | Payer: 59 | Source: Ambulatory Visit | Attending: Family Medicine | Admitting: Family Medicine

## 2019-08-27 ENCOUNTER — Encounter: Payer: Self-pay | Admitting: Physician Assistant

## 2019-08-27 ENCOUNTER — Ambulatory Visit: Payer: 59 | Attending: Physician Assistant | Admitting: Physician Assistant

## 2019-08-27 ENCOUNTER — Emergency Department (HOSPITAL_COMMUNITY): Payer: 59

## 2019-08-27 ENCOUNTER — Ambulatory Visit (INDEPENDENT_AMBULATORY_CARE_PROVIDER_SITE_OTHER): Payer: 59 | Admitting: Gastroenterology

## 2019-08-27 ENCOUNTER — Other Ambulatory Visit: Payer: Self-pay

## 2019-08-27 ENCOUNTER — Encounter (HOSPITAL_COMMUNITY): Payer: Self-pay | Admitting: Emergency Medicine

## 2019-08-27 VITALS — BP 110/70 | HR 121 | Ht 71.0 in | Wt 141.0 lb

## 2019-08-27 DIAGNOSIS — Z79899 Other long term (current) drug therapy: Secondary | ICD-10-CM | POA: Diagnosis not present

## 2019-08-27 DIAGNOSIS — R1084 Generalized abdominal pain: Secondary | ICD-10-CM | POA: Diagnosis not present

## 2019-08-27 DIAGNOSIS — K219 Gastro-esophageal reflux disease without esophagitis: Secondary | ICD-10-CM | POA: Diagnosis present

## 2019-08-27 DIAGNOSIS — I10 Essential (primary) hypertension: Secondary | ICD-10-CM | POA: Diagnosis present

## 2019-08-27 DIAGNOSIS — R7401 Elevation of levels of liver transaminase levels: Secondary | ICD-10-CM | POA: Diagnosis not present

## 2019-08-27 DIAGNOSIS — J45909 Unspecified asthma, uncomplicated: Secondary | ICD-10-CM | POA: Diagnosis present

## 2019-08-27 DIAGNOSIS — D509 Iron deficiency anemia, unspecified: Secondary | ICD-10-CM | POA: Diagnosis present

## 2019-08-27 DIAGNOSIS — R509 Fever, unspecified: Secondary | ICD-10-CM

## 2019-08-27 DIAGNOSIS — Z6821 Body mass index (BMI) 21.0-21.9, adult: Secondary | ICD-10-CM | POA: Diagnosis not present

## 2019-08-27 DIAGNOSIS — I82890 Acute embolism and thrombosis of other specified veins: Secondary | ICD-10-CM | POA: Diagnosis present

## 2019-08-27 DIAGNOSIS — Z87891 Personal history of nicotine dependence: Secondary | ICD-10-CM

## 2019-08-27 DIAGNOSIS — K859 Acute pancreatitis without necrosis or infection, unspecified: Secondary | ICD-10-CM | POA: Diagnosis present

## 2019-08-27 DIAGNOSIS — F329 Major depressive disorder, single episode, unspecified: Secondary | ICD-10-CM | POA: Diagnosis present

## 2019-08-27 DIAGNOSIS — D649 Anemia, unspecified: Secondary | ICD-10-CM | POA: Diagnosis not present

## 2019-08-27 DIAGNOSIS — I864 Gastric varices: Secondary | ICD-10-CM | POA: Diagnosis present

## 2019-08-27 DIAGNOSIS — K8522 Alcohol induced acute pancreatitis with infected necrosis: Secondary | ICD-10-CM | POA: Diagnosis present

## 2019-08-27 DIAGNOSIS — K8689 Other specified diseases of pancreas: Secondary | ICD-10-CM

## 2019-08-27 DIAGNOSIS — Z09 Encounter for follow-up examination after completed treatment for conditions other than malignant neoplasm: Secondary | ICD-10-CM

## 2019-08-27 DIAGNOSIS — F419 Anxiety disorder, unspecified: Secondary | ICD-10-CM | POA: Diagnosis present

## 2019-08-27 DIAGNOSIS — Z8701 Personal history of pneumonia (recurrent): Secondary | ICD-10-CM

## 2019-08-27 DIAGNOSIS — E440 Moderate protein-calorie malnutrition: Secondary | ICD-10-CM | POA: Diagnosis present

## 2019-08-27 DIAGNOSIS — F1010 Alcohol abuse, uncomplicated: Secondary | ICD-10-CM | POA: Diagnosis present

## 2019-08-27 DIAGNOSIS — Z7902 Long term (current) use of antithrombotics/antiplatelets: Secondary | ICD-10-CM

## 2019-08-27 DIAGNOSIS — I252 Old myocardial infarction: Secondary | ICD-10-CM | POA: Diagnosis not present

## 2019-08-27 DIAGNOSIS — K8521 Alcohol induced acute pancreatitis with uninfected necrosis: Secondary | ICD-10-CM

## 2019-08-27 DIAGNOSIS — K3184 Gastroparesis: Secondary | ICD-10-CM | POA: Diagnosis present

## 2019-08-27 DIAGNOSIS — K861 Other chronic pancreatitis: Secondary | ICD-10-CM | POA: Diagnosis present

## 2019-08-27 DIAGNOSIS — R7989 Other specified abnormal findings of blood chemistry: Secondary | ICD-10-CM | POA: Diagnosis not present

## 2019-08-27 DIAGNOSIS — E871 Hypo-osmolality and hyponatremia: Secondary | ICD-10-CM | POA: Diagnosis present

## 2019-08-27 DIAGNOSIS — K760 Fatty (change of) liver, not elsewhere classified: Secondary | ICD-10-CM | POA: Diagnosis present

## 2019-08-27 DIAGNOSIS — R197 Diarrhea, unspecified: Secondary | ICD-10-CM | POA: Diagnosis present

## 2019-08-27 DIAGNOSIS — K8591 Acute pancreatitis with uninfected necrosis, unspecified: Secondary | ICD-10-CM | POA: Diagnosis not present

## 2019-08-27 DIAGNOSIS — Z7982 Long term (current) use of aspirin: Secondary | ICD-10-CM

## 2019-08-27 DIAGNOSIS — R112 Nausea with vomiting, unspecified: Secondary | ICD-10-CM | POA: Diagnosis not present

## 2019-08-27 DIAGNOSIS — R945 Abnormal results of liver function studies: Secondary | ICD-10-CM | POA: Diagnosis not present

## 2019-08-27 DIAGNOSIS — E43 Unspecified severe protein-calorie malnutrition: Secondary | ICD-10-CM | POA: Diagnosis not present

## 2019-08-27 DIAGNOSIS — R1010 Upper abdominal pain, unspecified: Secondary | ICD-10-CM | POA: Diagnosis not present

## 2019-08-27 DIAGNOSIS — I2510 Atherosclerotic heart disease of native coronary artery without angina pectoris: Secondary | ICD-10-CM | POA: Diagnosis present

## 2019-08-27 DIAGNOSIS — Z811 Family history of alcohol abuse and dependence: Secondary | ICD-10-CM

## 2019-08-27 DIAGNOSIS — Z20822 Contact with and (suspected) exposure to covid-19: Secondary | ICD-10-CM | POA: Diagnosis present

## 2019-08-27 DIAGNOSIS — Z955 Presence of coronary angioplasty implant and graft: Secondary | ICD-10-CM

## 2019-08-27 LAB — CBC
HCT: 32 % — ABNORMAL LOW (ref 39.0–52.0)
Hemoglobin: 10.7 g/dL — ABNORMAL LOW (ref 13.0–17.0)
MCH: 30.2 pg (ref 26.0–34.0)
MCHC: 33.4 g/dL (ref 30.0–36.0)
MCV: 90.4 fL (ref 80.0–100.0)
Platelets: 559 10*3/uL — ABNORMAL HIGH (ref 150–400)
RBC: 3.54 MIL/uL — ABNORMAL LOW (ref 4.22–5.81)
RDW: 14.6 % (ref 11.5–15.5)
WBC: 10.6 10*3/uL — ABNORMAL HIGH (ref 4.0–10.5)
nRBC: 0 % (ref 0.0–0.2)

## 2019-08-27 LAB — COMPREHENSIVE METABOLIC PANEL
ALT: 52 U/L — ABNORMAL HIGH (ref 0–44)
AST: 72 U/L — ABNORMAL HIGH (ref 15–41)
Albumin: 2.2 g/dL — ABNORMAL LOW (ref 3.5–5.0)
Alkaline Phosphatase: 159 U/L — ABNORMAL HIGH (ref 38–126)
Anion gap: 11 (ref 5–15)
BUN: 8 mg/dL (ref 6–20)
CO2: 24 mmol/L (ref 22–32)
Calcium: 8.3 mg/dL — ABNORMAL LOW (ref 8.9–10.3)
Chloride: 93 mmol/L — ABNORMAL LOW (ref 98–111)
Creatinine, Ser: 0.65 mg/dL (ref 0.61–1.24)
GFR calc Af Amer: >60 mL/min (ref >60)
GFR calc non Af Amer: >60 mL/min (ref >60)
Glucose, Bld: 111 mg/dL — ABNORMAL HIGH (ref 70–99)
Potassium: 3.9 mmol/L (ref 3.5–5.1)
Sodium: 128 mmol/L — ABNORMAL LOW (ref 135–145)
Total Bilirubin: 1 mg/dL (ref 0.3–1.2)
Total Protein: 8 g/dL (ref 6.5–8.1)

## 2019-08-27 LAB — PROTIME-INR
INR: 1.2 (ref 0.8–1.2)
Prothrombin Time: 15.1 seconds (ref 11.4–15.2)

## 2019-08-27 LAB — LACTIC ACID, PLASMA
Lactic Acid, Venous: 1.4 mmol/L (ref 0.5–1.9)
Lactic Acid, Venous: 1.3 mmol/L (ref 0.5–1.9)

## 2019-08-27 LAB — SARS CORONAVIRUS 2 BY RT PCR (HOSPITAL ORDER, PERFORMED IN ~~LOC~~ HOSPITAL LAB): SARS Coronavirus 2: NEGATIVE

## 2019-08-27 LAB — LIPASE, BLOOD: Lipase: 27 U/L (ref 11–51)

## 2019-08-27 LAB — APTT: aPTT: 31 seconds (ref 24–36)

## 2019-08-27 MED ORDER — ONDANSETRON HCL 4 MG PO TABS: 4.0000 mg | ORAL_TABLET | Freq: Four times a day (QID) | ORAL | Status: DC | PRN

## 2019-08-27 MED ORDER — NITROGLYCERIN 0.4 MG SL SUBL: 0.4000 mg | SUBLINGUAL_TABLET | SUBLINGUAL | Status: DC | PRN

## 2019-08-27 MED ORDER — LACTATED RINGERS IV SOLN: INTRAVENOUS | Status: AC

## 2019-08-27 MED ORDER — KETOROLAC TROMETHAMINE 30 MG/ML IJ SOLN
30.0000 mg | Freq: Once | INTRAMUSCULAR | Status: AC
  Administered 2019-08-27: 30 mg via INTRAVENOUS
  Filled 2019-08-27: qty 1

## 2019-08-27 MED ORDER — ONDANSETRON HCL 4 MG/2ML IJ SOLN
4.0000 mg | Freq: Once | INTRAMUSCULAR | Status: AC
  Administered 2019-08-27: 4 mg via INTRAVENOUS
  Filled 2019-08-27: qty 2

## 2019-08-27 MED ORDER — LACTATED RINGERS IV SOLN: INTRAVENOUS | Status: DC

## 2019-08-27 MED ORDER — HEPARIN SODIUM (PORCINE) 5000 UNIT/ML IJ SOLN
5000.0000 [IU] | Freq: Three times a day (TID) | INTRAMUSCULAR | Status: DC
  Administered 2019-08-27 – 2019-08-28 (×3): 5000 [IU] via SUBCUTANEOUS
  Filled 2019-08-27 (×3): qty 1

## 2019-08-27 MED ORDER — LACTATED RINGERS IV BOLUS (SEPSIS)
1000.0000 mL | Freq: Once | INTRAVENOUS | Status: AC
  Administered 2019-08-27: 1000 mL via INTRAVENOUS

## 2019-08-27 MED ORDER — MORPHINE SULFATE (PF) 2 MG/ML IV SOLN
2.0000 mg | Freq: Once | INTRAVENOUS | Status: AC
  Administered 2019-08-27: 2 mg via INTRAVENOUS
  Filled 2019-08-27: qty 1

## 2019-08-27 MED ORDER — MORPHINE SULFATE (PF) 2 MG/ML IV SOLN
2.0000 mg | INTRAVENOUS | Status: DC | PRN
  Administered 2019-08-28 – 2019-08-30 (×16): 2 mg via INTRAVENOUS
  Filled 2019-08-27 (×17): qty 1

## 2019-08-27 MED ORDER — IOHEXOL 300 MG/ML  SOLN
100.0000 mL | Freq: Once | INTRAMUSCULAR | Status: AC | PRN
  Administered 2019-08-27: 100 mL via INTRAVENOUS

## 2019-08-27 MED ORDER — ONDANSETRON HCL 4 MG/2ML IJ SOLN
4.0000 mg | Freq: Four times a day (QID) | INTRAMUSCULAR | Status: DC | PRN
  Administered 2019-08-28 (×2): 4 mg via INTRAVENOUS
  Filled 2019-08-27 (×2): qty 2

## 2019-08-27 MED ORDER — SODIUM CHLORIDE 0.9 % IV SOLN
1.0000 g | Freq: Three times a day (TID) | INTRAVENOUS | Status: AC
  Administered 2019-08-27 – 2019-09-10 (×43): 1 g via INTRAVENOUS
  Filled 2019-08-27 (×45): qty 1

## 2019-08-27 NOTE — ED Provider Notes (Signed)
[facility]-EMERGENCY DEPT Provider Note   CSN: [id number] Arrival date & time: 08/27/19  1526     History No chief complaint on file.   Gary Atkinson is a 49 y.o. male.  HPI 49 year old male with a history of hypertension, hyperlipidemia, MI, CAD, asthma, recently diagnosed with pancreatic necrosis presents to the ER from about GI with concerns of fevers, poor p.o. intake, 65 pound weight loss last month and upper abdominal pain.  Admitted to the hospital on 7/21 with nausea, vomiting, upper abdominal pain and a CT scan which was concerning for pancreatic necrosis.  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  He also developed delirium tremens requiring IV Precedex.  Received IV Merrem for necrotizing pancreatitis.  Continued to have fevers despite IV meropenem, was receiving this for 5 days total.  Infectious disease was following, patient had been taken off antibiotics and discharge.  He was discharged on 8/4 with plans to follow-up with GI for repeat CT.  Has reportedly been doing poorly since discharge, reports unable to tolerate anything other than water.  Saw Shanda Bumps there with of our GI today, sent to the ED for further valuation, CT, IV hydration and possible nutrition.  Denies any chest pain, shortness of breath, urinary symptoms.  States he has been having diarrhea since before he left the hospital.    Past Medical History:  Diagnosis Date  . Acute pancreatitis   . Allergic rhinitis   . Anxiety   . Asthma   . CAD (coronary artery disease)   . Depression   . GERD (gastroesophageal reflux disease)   . Hyperlipemia   . Hypertension   . MI (myocardial infarction) (HCC)   . Pneumonia     Patient Active Problem List   Diagnosis Date Noted  . Pancreatic necrosis 08/04/2019  . Fever   . GAD (generalized anxiety disorder) 07/30/2019  . Major depressive disorder, single episode, severe  without psychosis (HCC) 07/30/2019  . Pleural effusion 07/29/2019  . Hypophosphatemia 07/27/2019  . Hypomagnesemia 07/26/2019  . Hypokalemia 07/26/2019  . Delirium tremens (HCC) 07/25/2019  . Acute pancreatitis 07/24/2019  . Intractable nausea and vomiting 07/24/2019  . Abdominal pain 07/24/2019  . GERD (gastroesophageal reflux disease) 07/24/2019  . Hiatal hernia 07/24/2019  . Elevated LFTs 07/24/2019  . Elevated troponin 07/24/2019  . Thrombocytopenia (HCC) 07/24/2019  . CAD (coronary artery disease), native coronary artery 08/25/2013  . Bradycardia 08/25/2013  . Benign essential HTN 08/25/2013  . Dyslipidemia 08/25/2013  . Iron deficiency anemia, unspecified 08/25/2013  . CAP (community acquired pneumonia) 08/24/2013    Past Surgical History:  Procedure Laterality Date  . APPENDECTOMY    . CARDIAC CATHETERIZATION     with 2 stents       Family History  Problem Relation Age of Onset  . Liver disease Father   . Alcoholism Father     Social History   Tobacco Use  . Smoking status: Former Smoker    Types: Cigarettes    Quit date: 2002    Years since quitting: 19.6  . Smokeless tobacco: Never Used  Vaping Use  . Vaping Use: Never used  Substance Use Topics  . Alcohol use: Not Currently  . Drug use: Never    Home Medications Prior to Admission medications   Medication Sig Start Date End Date Taking? Authorizing Provider  acetaminophen (TYLENOL) 325 MG tablet Take 650 mg by mouth every 6 (six) hours as needed for  mild pain or headache.   Yes [provider]  Ascorbic Acid (VITAMIN C PO) Take 1 tablet by mouth every evening.    Yes [provider]  aspirin 81 MG tablet Take 81 mg by mouth at bedtime.   Yes [provider]  busPIRone (BUSPAR) 10 MG tablet Take 30 mg by mouth at bedtime.  07/08/19  Yes [provider]  clonazePAM (KLONOPIN) 1 MG disintegrating tablet Take 1 mg by mouth at bedtime.  07/08/19  Yes [provider]  dexlansoprazole (DEXILANT) 60 MG capsule Take 1 capsule (60 mg total) by mouth daily. Patient taking differently: Take 60 mg by mouth every evening.  01/09/19  Yes Armbruster, Willaim RayasSteven P, MD  lisinopril (ZESTRIL) 10 MG tablet Take 1 tablet (10 mg total) by mouth daily. Patient taking differently: Take 10 mg by mouth every evening.  08/06/19  Yes Amin, Loura HaltAnkit Chirag, MD  propranolol (INDERAL) 40 MG tablet Take 0.5 tablets (20 mg total) by mouth daily. 08/06/19 09/05/19 Yes Amin, Ankit Chirag, MD  QUEtiapine (SEROQUEL) 50 MG tablet Take 1 tablet (50 mg total) by mouth at bedtime. 08/06/19  Yes Amin, Loura HaltAnkit Chirag, MD  thiamine 100 MG tablet Take 1 tablet (100 mg total) by mouth daily. Patient taking differently: Take 100 mg by mouth every evening.  08/06/19  Yes Amin, Loura HaltAnkit Chirag, MD  topiramate (TOPAMAX) 25 MG tablet Take 25 mg by mouth every evening.  12/12/18  Yes [provider]  nitroGLYCERIN (NITROSTAT) 0.4 MG SL tablet Place 0.4 mg under the tongue as needed for chest pain.  02/23/13 07/24/19  [provider]    Allergies    Patient has no known allergies.  Review of Systems   Review of Systems  Constitutional: Positive for activity change, appetite change, chills, fatigue and unexpected weight change. Negative for fever.  HENT: Negative for ear pain and sore throat.   Eyes: Negative for pain and visual disturbance.  Respiratory: Negative for cough and shortness of breath.   Cardiovascular: Negative for chest pain and palpitations.  Gastrointestinal: Positive for abdominal pain, diarrhea, nausea and vomiting. Negative for blood in stool, constipation and rectal pain.  Genitourinary: Negative for dysuria and hematuria.  Musculoskeletal: Negative for arthralgias and back pain.  Skin: Negative for color change and rash.  Neurological: Positive for weakness. Negative for seizures and syncope.  Psychiatric/Behavioral: Negative for confusion.  All other systems reviewed and are  negative.   Physical Exam Updated Vital Signs BP 117/73 (BP Location: Left Arm)   Pulse (!) 113   Temp 98.6 F (37 C) (Oral)   Resp (!) 22   SpO2 100%   Physical Exam Vitals and nursing note reviewed.  Constitutional:      General: He is not in acute distress.    Appearance: He is well-developed. He is ill-appearing. He is not toxic-appearing or diaphoretic.  HENT:     Head: Normocephalic and atraumatic.     Mouth/Throat:     Mouth: Mucous membranes are moist.     Pharynx: Oropharynx is clear.  Eyes:     Conjunctiva/sclera: Conjunctivae normal.  Cardiovascular:     Rate and Rhythm: Regular rhythm. Tachycardia present.     Pulses: Normal pulses.     Heart sounds: Normal heart sounds. No murmur heard.   Pulmonary:     Effort: Pulmonary effort is normal. No respiratory distress.     Breath sounds: Normal breath sounds.  Abdominal:     General: Abdomen is flat. Bowel  sounds are normal.     Palpations: Abdomen is soft.     Tenderness: There is abdominal tenderness in the right upper quadrant, epigastric area and left upper quadrant. There is no right CVA tenderness or left CVA tenderness.     Hernia: No hernia is present.  Musculoskeletal:     Cervical back: Neck supple.  Skin:    General: Skin is warm and dry.  Neurological:     Mental Status: He is alert.     ED Results / Procedures / Treatments   Labs (all labs ordered are listed, but only abnormal results are displayed) Labs Reviewed  COMPREHENSIVE METABOLIC PANEL - Abnormal; Notable for the following components:      Result Value   Sodium 128 (*)    Chloride 93 (*)    Glucose, Bld 111 (*)    Calcium 8.3 (*)    Albumin 2.2 (*)    AST 72 (*)    ALT 52 (*)    Alkaline Phosphatase 159 (*)    All other components within normal limits  CBC - Abnormal; Notable for the following components:   WBC 10.6 (*)    RBC 3.54 (*)    Hemoglobin 10.7 (*)    HCT 32.0 (*)    Platelets 559 (*)    All other components  within normal limits  SARS CORONAVIRUS 2 BY RT PCR (HOSPITAL ORDER, PERFORMED IN [facility])  CULTURE, BLOOD (SINGLE)  URINE CULTURE  CULTURE, BLOOD (SINGLE)  LIPASE, BLOOD  LACTIC ACID, PLASMA  PROTIME-INR  APTT  URINALYSIS, ROUTINE W REFLEX MICROSCOPIC  LACTIC ACID, PLASMA    EKG EKG Interpretation  Date/Time:  Wednesday August 27 2019 19:17:18 EDT Ventricular Rate:  117 PR Interval:    QRS Duration: 83 QT Interval:  321 QTC Calculation: 448 R Axis:   56 Text Interpretation: Sinus tachycardia Probable left atrial enlargement Since last tracing rate faster Confirmed by Jacalyn Lefevre [phone]) on 08/27/2019 7:22:54 PM   Radiology DG Chest Port 1 View  Result Date: 08/27/2019 CLINICAL DATA:  Possible sepsis EXAM: PORTABLE CHEST 1 VIEW COMPARISON:  08/01/2019 FINDINGS: The heart size and mediastinal contours are within normal limits. Both lungs are clear. The visualized skeletal structures are unremarkable. IMPRESSION: Negative. Electronically Signed   By: Charlett Nose M.D.   On: 08/27/2019 19:35    Procedures Procedures (including critical care time)  Medications Ordered in ED Medications  lactated ringers infusion ( Intravenous New Bag/Given 08/27/19 2021)  meropenem (MERREM) 1 g in sodium chloride 0.9 % 100 mL IVPB (has no administration in time range)  lactated ringers bolus 1,000 mL (has no administration in time range)  lactated ringers bolus 1,000 mL (1,000 mLs Intravenous New Bag/Given 08/27/19 2021)  ketorolac (TORADOL) 30 MG/ML injection 30 mg (30 mg Intravenous Given 08/27/19 2023)  morphine 2 MG/ML injection 2 mg (2 mg Intravenous Given 08/27/19 2023)  ondansetron (ZOFRAN) injection 4 mg (4 mg Intravenous Given 08/27/19 2023)  iohexol (OMNIPAQUE) 300 MG/ML solution 100 mL (100 mLs Intravenous Contrast Given 08/27/19 2044)    ED Course  I have reviewed the triage vital signs and the nursing notes.  Pertinent labs & imaging results that were available  during my care of the patient were reviewed by me and considered in my medical decision making (see chart for details).    MDM Rules/Calculators/A&P  27 PM: 49 year old male with poor p.o. tolerance, fevers, abdominal pain in the setting of known necrotizing pancreatitis On presentation, he is ill-appearing, alert, oriented, speaking full sentences without increased work of breathing.  On arrival, the patient was tachycardic with a pulse in the 120s, tachypneic and a temperature of 102.5 rectally.  Thus, code sepsis was initiated.  Physical exam with right upper epigastric and left upper quadrant tenderness.  Lung sounds clear.  Concern for sepsis, pancreatic abscess, worsening necrotizing pancreatitis  LABS:  Personally reviewed and interpreted his labs CBC with leukocytosis of 10.6.  Hemoglobin of 10.7 which appears to be improved from 3 weeks ago.  Platelets of 559 likely reactive CMP with hyponatremia 128, chloride of 93.  Calcium of 8.3.  AST and ALT elevated at 72 and 52 respectively.  Normal bilirubin.  Normal anion gap.   Lipase normal. PT/INR normal, APTT normal Initial lactate normal Blood cultures pending  UA and culture pending Covid pending  IMAGING: Chest x-ray without evidence of pneumonia or cardiopulmonary process CT of the abdomen pending per GI  MDM:  8:48 PM: Spoke with Dr. Christella Hartigan with [facility].  They do not recommend any other additional tests.  Awaiting CT to rule out pancreatic abscess.  Sepsis protocol initiated, fluids started per protocol.  Holding antibiotics pending CT scan.  Patient given Zofran, Toradol for fever.  9:50 PM: Spoke with Dr. Toniann Fail who will admit the patient for further evaluation.  [facility] will follow.  Patient started on IV meropenem which is the antibiotic he was on previous admission per chart review.  Repeat second blood culture, continuing LR fluids.  Overall hemodynamically stable at this time.  Final  Clinical Impression(s) / ED Diagnoses Final diagnoses:  Pancreatitis, necrotizing    Rx / DC Orders ED Discharge Orders    None       Leone Brand 08/27/19 2152    Jacalyn Lefevre, MD 08/29/19 1504

## 2019-08-27 NOTE — ED Notes (Signed)
Pt. Made aware for the need of urine specimen. 

## 2019-08-27 NOTE — H&P (Signed)
History and Physical    Gary Atkinson ZOX:[id number] DOB: [birth year] DOA: 08/27/2019  PCP: [facility]  Patient coming from: Home.  Chief Complaint: Abdominal pain.  HPI: Gary Atkinson is a 49 y.o. male with history of CAD status post stenting 10 years ago in [place] recently admitted for acute necrotizing pancreatitis discharged on 08/06/2019 3 weeks ago and had followed up with gastroenterologist today was found to have persistent tachycardia and is doing poorly and was referred to the ER.  Patient states since discharge patient has not been able to tolerate diet only drinks water most of the time occasionally with her banana.  Pain has been persistent mostly the epigastric area and whenever he tries to eat something either vomits or has diarrhea.  Denies chest pain or shortness of breath.  Has been running fevers off and on.  ED Course: In the ER patient was febrile with temperature 102 F tachycardic CT abdomen pelvis shows features concerning for acute on chronic pancreatitis with possible infection developing in the organized necrotic tissue with possible obstruction of the splenic vein also generalized inflammation of the gallbladder and upper part of the abdomen.  Patient was started on fluids independent pain relief medication and admitted for further management.  [place] gastroenterologist was notified.  Covid test was negative chest x-ray unremarkable patient not hypoxic.  Labs show BUN of 8 albumin of 2.2 AST 72 ALT 52 hemoglobin 10.7.  Review of Systems: As per HPI, rest all negative.   Past Medical History:  Diagnosis Date  . Acute pancreatitis   . Allergic rhinitis   . Anxiety   . Asthma   . CAD (coronary artery disease)   . Depression   . GERD (gastroesophageal reflux disease)   . Hyperlipemia   . Hypertension   . MI (myocardial infarction) (HCC)   . Pneumonia     Past Surgical History:  Procedure Laterality Date  . APPENDECTOMY    . CARDIAC  CATHETERIZATION     with 2 stents     reports that he quit smoking about 19 years ago. His smoking use included cigarettes. He has never used smokeless tobacco. He reports previous alcohol use. He reports that he does not use drugs.  No Known Allergies  Family History  Problem Relation Age of Onset  . Liver disease Father   . Alcoholism Father     Prior to Admission medications   Medication Sig Start Date End Date Taking? Authorizing Provider  acetaminophen (TYLENOL) 325 MG tablet Take 650 mg by mouth every 6 (six) hours as needed for mild pain or headache.   Yes [provider]  Ascorbic Acid (VITAMIN C PO) Take 1 tablet by mouth every evening.    Yes [provider]  aspirin 81 MG tablet Take 81 mg by mouth at bedtime.   Yes [provider]  busPIRone (BUSPAR) 10 MG tablet Take 30 mg by mouth at bedtime.  07/08/19  Yes [provider]  clonazePAM (KLONOPIN) 1 MG disintegrating tablet Take 1 mg by mouth at bedtime.  07/08/19  Yes [provider]  dexlansoprazole (DEXILANT) 60 MG capsule Take 1 capsule (60 mg total) by mouth daily. Patient taking differently: Take 60 mg by mouth every evening.  01/09/19  Yes Armbruster, Willaim Rayas, MD  lisinopril (ZESTRIL) 10 MG tablet Take 1 tablet (10 mg total) by mouth daily. Patient taking differently: Take 10 mg by mouth every evening.  08/06/19  Yes Amin, Loura Halt, MD  propranolol (INDERAL)  40 MG tablet Take 0.5 tablets (20 mg total) by mouth daily. 08/06/19 09/05/19 Yes Amin, Ankit Chirag, MD  QUEtiapine (SEROQUEL) 50 MG tablet Take 1 tablet (50 mg total) by mouth at bedtime. 08/06/19  Yes Amin, Loura Halt, MD  thiamine 100 MG tablet Take 1 tablet (100 mg total) by mouth daily. Patient taking differently: Take 100 mg by mouth every evening.  08/06/19  Yes Amin, Loura Halt, MD  topiramate (TOPAMAX) 25 MG tablet Take 25 mg by mouth every evening.  12/12/18  Yes [provider]  nitroGLYCERIN (NITROSTAT)  0.4 MG SL tablet Place 0.4 mg under the tongue as needed for chest pain.  02/23/13 07/24/19  [provider]    Physical Exam: Constitutional: Moderately built and nourished. Vitals:   08/27/19 1855 08/27/19 1902 08/27/19 2013 08/27/19 2206  BP: 117/83  117/73 116/73  Pulse: (!) 120  (!) 113 93  Resp: 16  (!) 22 20  Temp:  (!) 102.5 F (39.2 C) 98.6 F (37 C) 98.8 F (37.1 C)  TempSrc:  Rectal Oral Oral  SpO2: 100%  100% 100%   Eyes: Anicteric no pallor. ENMT: No discharge from the ears eyes nose or mouth. Neck: No mass felt.  No neck rigidity. Respiratory: No rhonchi or crepitations. Cardiovascular: S1-S2 heard. Abdomen: Epigastric tenderness present no guarding or rigidity. Musculoskeletal: No edema. Skin: No rash. Neurologic: Alert awake oriented to time place and person.  Moves all extremities. Psychiatric: Appears normal.  Normal affect.   Labs on Admission: I have personally reviewed following labs and imaging studies  CBC: Recent Labs  Lab 08/27/19 1546  WBC 10.6*  HGB 10.7*  HCT 32.0*  MCV 90.4  PLT 559*   Basic Metabolic Panel: Recent Labs  Lab 08/27/19 1546  NA 128*  K 3.9  CL 93*  CO2 24  GLUCOSE 111*  BUN 8  CREATININE 0.65  CALCIUM 8.3*   GFR: Estimated Creatinine Clearance: 98.9 mL/min (by C-G formula based on SCr of 0.65 mg/dL). Liver Function Tests: Recent Labs  Lab 08/27/19 1546  AST 72*  ALT 52*  ALKPHOS 159*  BILITOT 1.0  PROT 8.0  ALBUMIN 2.2*   Recent Labs  Lab 08/27/19 1546  LIPASE 27   No results for input(s): AMMONIA in the last 168 hours. Coagulation Profile: Recent Labs  Lab 08/27/19 2000  INR 1.2   Cardiac Enzymes: No results for input(s): CKTOTAL, CKMB, CKMBINDEX, TROPONINI in the last 168 hours. BNP (last 3 results) No results for input(s): PROBNP in the last 8760 hours. HbA1C: No results for input(s): HGBA1C in the last 72 hours. CBG: No results for input(s): GLUCAP in the last 168 hours. Lipid  Profile: No results for input(s): CHOL, HDL, LDLCALC, TRIG, CHOLHDL, LDLDIRECT in the last 72 hours. Thyroid Function Tests: No results for input(s): TSH, T4TOTAL, FREET4, T3FREE, THYROIDAB in the last 72 hours. Anemia Panel: No results for input(s): VITAMINB12, FOLATE, FERRITIN, TIBC, IRON, RETICCTPCT in the last 72 hours. Urine analysis:    Component Value Date/Time   COLORURINE COLORLESS (A) 08/01/2019 1122   APPEARANCEUR CLEAR 08/01/2019 1122   LABSPEC 1.000 (L) 08/01/2019 1122   PHURINE 7.0 08/01/2019 1122   GLUCOSEU NEGATIVE 08/01/2019 1122   HGBUR NEGATIVE 08/01/2019 1122   BILIRUBINUR NEGATIVE 08/01/2019 1122   KETONESUR NEGATIVE 08/01/2019 1122   PROTEINUR NEGATIVE 08/01/2019 1122   NITRITE NEGATIVE 08/01/2019 1122   LEUKOCYTESUR NEGATIVE 08/01/2019 1122   Sepsis Labs: @LABRCNTIP (procalcitonin:4,lacticidven:4) ) Recent Results (from the past 240 hour(s))  SARS Coronavirus 2 by RT PCR (hospital order, performed in [facility] hospital lab) Nasopharyngeal Nasopharyngeal Swab     Status: None   Collection Time: 08/27/19  7:19 PM   Specimen: Nasopharyngeal Swab  Result Value Ref Range Status   SARS Coronavirus 2 NEGATIVE NEGATIVE Final    Comment: (NOTE) SARS-CoV-2 target nucleic acids are NOT DETECTED.  The SARS-CoV-2 RNA is generally detectable in upper and lower respiratory specimens during the acute phase of infection. The lowest concentration of SARS-CoV-2 viral copies this assay can detect is 250 copies / mL. A negative result does not preclude SARS-CoV-2 infection and should not be used as the sole basis for treatment or other patient management decisions.  A negative result may occur with improper specimen collection / handling, submission of specimen other than nasopharyngeal swab, presence of viral mutation(s) within the areas targeted by this assay, and inadequate number of viral copies (<250 copies / mL). A negative result must be combined with  clinical observations, patient history, and epidemiological information.  Fact Sheet for Patients:   BoilerBrush.com.cy  Fact Sheet for Healthcare Providers: https://pope.com/  This test is not yet approved or  cleared by the [place] FDA and has been authorized for detection and/or diagnosis of SARS-CoV-2 by FDA under an Emergency Use Authorization (EUA).  This EUA will remain in effect (meaning this test can be used) for the duration of the COVID-19 declaration under Section 564(b)(1) of the Act, 21 U.S.C. section 360bbb-3(b)(1), unless the authorization is terminated or revoked sooner.  Performed at [facility], [address] [place], [place] [address]      Radiological Exams on Admission: CT ABDOMEN PELVIS W CONTRAST  Result Date: 08/27/2019 CLINICAL DATA:  History of necrotic pancreatitis diagnosed in July, presents with abdominal pain. EXAM: CT ABDOMEN AND PELVIS WITH CONTRAST TECHNIQUE: Multidetector CT imaging of the abdomen and pelvis was performed using the standard protocol following bolus administration of intravenous contrast. CONTRAST:  OMNIPAQUE IOHEXOL 300 MG/ML  SOLN COMPARISON:  Multiple prior studies, most recent comparison from July 31, 2019 FINDINGS: Lower chest: Lung bases are clear. No consolidation. No pleural effusion. Hepatobiliary: Marked hepatic steatosis. Main portal vein remains patent, see below. Biliary duct distension with transition in the pancreatic head, this is developed since the previous exam. Stranding generalize in the upper abdomen some of which is adjacent to the gallbladder. This is of uncertain significance Pancreas: Occlusion of splenic vein. Narrowing of the main portal vein, further narrowing of the peripheral main portal vein since the prior study. (Image 28, series 2) collateral formation with numerous gastroepiploic collaterals and gastric varices which have  developed since the previous exam. 3.1 x 1.9 cm collection in the anterior pancreas does contain a small amount of gas compatible with walled-off necrosis involving the pancreatic head in the pancreatic-duodenal groove. Further necrosis of the gland. Walled off necrosis in the tail of the pancreas measuring 5.7 x 5.0 cm (image 27, series 2) previously 6.7 x 7.2 cm. This extends into the root of the mesentery and the anterior pararenal space in the LEFT hemiabdomen. Abundant stranding about the pancreas and duodenum. SMV is patent as described above. Spleen: Spleen is enlarged. Small amount of perisplenic fluid and fluid in the gastrohepatic recess. Wall up necrosis extends into the splenic hilum. Adrenals/Urinary Tract: Adrenal glands are normal. Symmetric renal enhancement. No hydronephrosis. Urinary bladder is normal. Stomach/Bowel: Stomach with gastric varices. Peri duodenal stranding and stranding throughout the root of the small bowel  mesentery No sign of bowel obstruction. Colonic thickening at the hepatic flexure and splenic flexure similar at the a hepatic flexure but improved with respect to splenic flexure thickening when compared to the previous study. Vascular/Lymphatic: Calcified and noncalcified atheromatous plaque of the abdominal aorta. No adenopathy in the retroperitoneum. No adenopathy in the pelvis. Reproductive: Prostate unremarkable by CT. Other: Small volume ascites in the pelvis. Diminished when compared to the prior study. Musculoskeletal: No acute musculoskeletal process. No destructive bone finding. IMPRESSION: 1. Walled-off necrosis with further organization of necrotic pancreatic collections about the pancreas tracking into splenic hilum and showing further necrosis of the gland at greater than 50% necrosis. Findings suspicious for acute pancreatitis superimposed on sequela of previous pancreatitis discussed above. 2. Small locules of gas in the pancreatic head within areas of walled-off  necrosis suspicious for early infection. 3. Occlusion of the splenic vein. Narrowing of the main portal vein, further narrowing of the peripheral main portal vein since the prior study. This results in interval development of upper abdominal venous collaterals and gastric varices. 4. Biliary duct distension approximately 9 mm of the common bile duct has developed since the prior study. The gallbladder is less distended than on the prior exam. No overt signs of acute cholecystitis though there is generalized inflammation in the upper abdomen. Close attention on follow-up. 5. Colonic thickening at the hepatic flexure and splenic flexure similar at the a hepatic flexure but improved with respect to splenic flexure thickening when compared to the previous study. 6. Small volume ascites in the pelvis. Diminished when compared to the prior study. 7. Marked hepatic steatosis. 8. Aortic atherosclerosis. Aortic Atherosclerosis (ICD10-I70.0). Electronically Signed   By: Donzetta KohutGeoffrey  Wile M.D.   On: 08/27/2019 21:24   DG Chest Port 1 View  Result Date: 08/27/2019 CLINICAL DATA:  Possible sepsis EXAM: PORTABLE CHEST 1 VIEW COMPARISON:  08/01/2019 FINDINGS: The heart size and mediastinal contours are within normal limits. Both lungs are clear. The visualized skeletal structures are unremarkable. IMPRESSION: Negative. Electronically Signed   By: Charlett NoseKevin  Dover M.D.   On: 08/27/2019 19:35    EKG: Independently reviewed.  Sinus tachycardia.  Assessment/Plan Principal Problem:   Pancreatitis, necrotizing Active Problems:   CAD (coronary artery disease), native coronary artery   Benign essential HTN   Pancreatic necrosis    1. Acute on chronic pancreatitis with recent admission for necrotizing/hemorrhagic pancreatitis presently febrile with CAT scan showing features concerning for developing an infection blood cultures obtained started on imipenem IV fluids pain really medication n.p.o. and GI has been  notified. 2. History of CAD status post stenting presently n.p.o.  We will keep patient on per rectal aspirin. 3. History of hypertension we will keep patient on as needed IV labetalol for now. 4. History of anxiety and depression presently n.p.o. 5. History of alcohol abuse has not had any alcohol since last admission as per the patient.  Closely monitor.  Thiamine IV. 6. Anemia hemoglobin appears to be at baseline.  Follow CBC. 7. Note that patient's CAT scan also shows features concerning for obstruction of the splenic vein and inflammatory signs around the upper part of the abdomen.  Given the acute severe pancreatitis patient will need close monitoring any further worsening in inpatient status.   DVT prophylaxis: Heparin for now.  Closely monitor.  Patient does have history of hemorrhagic pancreatitis. Code Status: Full code. Family Communication: Discussed with patient. Disposition Plan: Home. Consults called: ER physician notified [facility]. Admission status: Inpatient.   Georgetta HaberArshad  Demetra Shiner MD [facility] Pager [phone].  If 7PM-7AM, please contact night-coverage [email or web address] Password [facility]  08/27/2019, 10:41 PM

## 2019-08-27 NOTE — Progress Notes (Signed)
08/27/2019 Gary Atkinson [id number] 1970-11-26   HISTORY OF PRESENT ILLNESS: This is a 49 year old male who was hospitalized from July 21 to August 4 for alcoholic pancreatitis with necrosis of the pancreatic head and uncinate process.  Plan from a GI standpoint was repeat imaging in several weeks.  He is here today with his sister, Stephannie Peters, for hospital follow-up.  They say that he has been doing extremely poorly since his hospital discharge.  Has been unable to tolerate anything by mouth except for water.  Has not really tolerated any food except for a very occasional banana.  Has lost an extreme amount of weight, approximately 65 pounds since July.  Has upper abdominal pain.  Reports intermittent fevers up to 102.8.  Is extremely weak and unable to do anything for himself at home.  There has been no further alcohol use.  Past Medical History:  Diagnosis Date  . Allergic rhinitis   . Anxiety   . Asthma   . CAD (coronary artery disease)   . Depression   . GERD (gastroesophageal reflux disease)   . Hyperlipemia   . Hypertension   . MI (myocardial infarction) (HCC)   . Pneumonia    Past Surgical History:  Procedure Laterality Date  . APPENDECTOMY    . CARDIAC CATHETERIZATION     with 2 stents    reports that he quit smoking about 19 years ago. His smoking use included cigarettes. He has never used smokeless tobacco. He reports current alcohol use. He reports that he does not use drugs. family history includes Alcoholism in his father; Liver disease in his father. No Known Allergies    Outpatient Encounter Medications as of 08/27/2019  Medication Sig  . acetaminophen (TYLENOL) 325 MG tablet Take 650 mg by mouth every 6 (six) hours as needed for mild pain or headache.  . Ascorbic Acid (VITAMIN C PO) Take 1 tablet by mouth daily.  Marland Kitchen aspirin 81 MG tablet Take 81 mg by mouth at bedtime.  . busPIRone (BUSPAR) 10 MG tablet Take 20 mg by mouth 2 (two) times daily.  . clonazePAM  (KLONOPIN) 1 MG disintegrating tablet Take 1 mg by mouth 2 (two) times daily as needed for anxiety.  Marland Kitchen dexlansoprazole (DEXILANT) 60 MG capsule Take 1 capsule (60 mg total) by mouth daily.  Marland Kitchen lisinopril (ZESTRIL) 10 MG tablet Take 1 tablet (10 mg total) by mouth daily.  . nitroGLYCERIN (NITROSTAT) 0.4 MG SL tablet Place 0.4 mg under the tongue as needed for chest pain.   Marland Kitchen propranolol (INDERAL) 40 MG tablet Take 0.5 tablets (20 mg total) by mouth daily.  . QUEtiapine (SEROQUEL) 50 MG tablet Take 1 tablet (50 mg total) by mouth at bedtime. (Patient not taking: Reported on 08/27/2019)  . thiamine 100 MG tablet Take 1 tablet (100 mg total) by mouth daily.  Marland Kitchen topiramate (TOPAMAX) 25 MG tablet Take 25 mg by mouth daily.    No facility-administered encounter medications on file as of 08/27/2019.    REVIEW OF SYSTEMS  : All other systems reviewed and negative except where noted in the History of Present Illness.  PHYSICAL EXAM: BP 110/70   Pulse (!) 121   Ht 5\' 11"  (1.803 m)   Wt 141 lb (64 kg)   BMI 19.67 kg/m  General:  Ill-appearing white male in no acute distress; shaky, weak, pale Head: Normocephalic and atraumatic Eyes:  Sclerae anicteric, conjunctiva pink. Ears: Normal auditory acuity Lungs: Clear throughout to auscultation; no increased WOB. Heart:  Regular rate and rhythm; no M/R/G. Abdomen: Soft, non-distended.  BS present.  Moderate epigastric TTP. Musculoskeletal: Symmetrical with no gross deformities  Skin: No lesions on visible extremities Extremities: No edema  Neurological: Alert oriented x 4, grossly non-focal Psychological:  Alert and cooperative. Normal mood and affect  ASSESSMENT AND PLAN: *Alcoholic pancreatitis with necrosis to the pancreatic head/uncinate process:  Patient doing extremely poorly since hospital discharge on August 4.  Reports intermittent fevers up to 102.8.  He is tachycardic today.  Complains of feeling weak and dizzy, unable to take care of himself  at home.  Tolerating water, but otherwise not tolerating any other type of solid food/nutrition.  Has lost an extreme amount of weight.  Patient needs to be hospitalized.  We called EMS today to take him to the ER from our office.  He will need labs, IV hydration, repeat CT scan, and then determine how he will receive nutrition if he continues to do poorly.   CC:  No ref. provider found

## 2019-08-27 NOTE — ED Notes (Signed)
PA at bedside.

## 2019-08-27 NOTE — ED Notes (Signed)
Patient has been advised that a urine specimen is needed. Patient states he is not able to urinate. Will check back after 2nd bolus completed

## 2019-08-27 NOTE — ED Triage Notes (Signed)
Per [facility] pt from [facility] for further work up that can be done in the office for pt's abd pains with n/d. Been going on since July when admitted here. Hx pancreatic necrosis. Vitals: 126/84 HR 108, R20, 97.8 CBG 101, 16R

## 2019-08-28 ENCOUNTER — Inpatient Hospital Stay: Payer: Self-pay

## 2019-08-28 DIAGNOSIS — K8522 Alcohol induced acute pancreatitis with infected necrosis: Principal | ICD-10-CM

## 2019-08-28 DIAGNOSIS — E43 Unspecified severe protein-calorie malnutrition: Secondary | ICD-10-CM

## 2019-08-28 DIAGNOSIS — D649 Anemia, unspecified: Secondary | ICD-10-CM

## 2019-08-28 DIAGNOSIS — R1084 Generalized abdominal pain: Secondary | ICD-10-CM

## 2019-08-28 DIAGNOSIS — K859 Acute pancreatitis without necrosis or infection, unspecified: Secondary | ICD-10-CM | POA: Diagnosis present

## 2019-08-28 LAB — IRON AND TIBC
Iron: 21 ug/dL — ABNORMAL LOW (ref 45–182)
Saturation Ratios: 21 % (ref 17.9–39.5)
TIBC: 100 ug/dL — ABNORMAL LOW (ref 250–450)
UIBC: 79 ug/dL

## 2019-08-28 LAB — BASIC METABOLIC PANEL
Anion gap: 8 (ref 5–15)
BUN: 9 mg/dL (ref 6–20)
CO2: 25 mmol/L (ref 22–32)
Calcium: 7.7 mg/dL — ABNORMAL LOW (ref 8.9–10.3)
Chloride: 97 mmol/L — ABNORMAL LOW (ref 98–111)
Creatinine, Ser: 0.69 mg/dL (ref 0.61–1.24)
GFR calc Af Amer: >60 mL/min (ref >60)
GFR calc non Af Amer: >60 mL/min (ref >60)
Glucose, Bld: 98 mg/dL (ref 70–99)
Potassium: 4 mmol/L (ref 3.5–5.1)
Sodium: 130 mmol/L — ABNORMAL LOW (ref 135–145)

## 2019-08-28 LAB — ABO/RH: ABO/RH(D): O POS

## 2019-08-28 LAB — HEPATIC FUNCTION PANEL
ALT: 35 U/L (ref 0–44)
AST: 44 U/L — ABNORMAL HIGH (ref 15–41)
Albumin: 1.6 g/dL — ABNORMAL LOW (ref 3.5–5.0)
Alkaline Phosphatase: 100 U/L (ref 38–126)
Bilirubin, Direct: 0.3 mg/dL — ABNORMAL HIGH (ref 0.0–0.2)
Indirect Bilirubin: 0.8 mg/dL (ref 0.3–0.9)
Total Bilirubin: 1.1 mg/dL (ref 0.3–1.2)
Total Protein: 5.9 g/dL — ABNORMAL LOW (ref 6.5–8.1)

## 2019-08-28 LAB — CBC
HCT: 33.3 % — ABNORMAL LOW (ref 39.0–52.0)
HCT: 21.6 % — ABNORMAL LOW (ref 39.0–52.0)
Hemoglobin: 11 g/dL — ABNORMAL LOW (ref 13.0–17.0)
Hemoglobin: 7.2 g/dL — ABNORMAL LOW (ref 13.0–17.0)
MCH: 30.1 pg (ref 26.0–34.0)
MCH: 30 pg (ref 26.0–34.0)
MCHC: 33 g/dL (ref 30.0–36.0)
MCHC: 33.3 g/dL (ref 30.0–36.0)
MCV: 91.2 fL (ref 80.0–100.0)
MCV: 90 fL (ref 80.0–100.0)
Platelets: 507 10*3/uL — ABNORMAL HIGH (ref 150–400)
Platelets: 287 10*3/uL (ref 150–400)
RBC: 3.65 MIL/uL — ABNORMAL LOW (ref 4.22–5.81)
RBC: 2.4 MIL/uL — ABNORMAL LOW (ref 4.22–5.81)
RDW: 14.6 % (ref 11.5–15.5)
RDW: 14.6 % (ref 11.5–15.5)
WBC: 10.6 10*3/uL — ABNORMAL HIGH (ref 4.0–10.5)
WBC: 4.5 10*3/uL (ref 4.0–10.5)
nRBC: 0 % (ref 0.0–0.2)
nRBC: 0 % (ref 0.0–0.2)

## 2019-08-28 LAB — GLUCOSE, CAPILLARY
Glucose-Capillary: 72 mg/dL (ref 70–99)
Glucose-Capillary: 78 mg/dL (ref 70–99)

## 2019-08-28 LAB — URINALYSIS, ROUTINE W REFLEX MICROSCOPIC
Bilirubin Urine: NEGATIVE
Glucose, UA: NEGATIVE mg/dL
Hgb urine dipstick: NEGATIVE
Ketones, ur: 5 mg/dL — AB
Leukocytes,Ua: NEGATIVE
Nitrite: NEGATIVE
Protein, ur: NEGATIVE mg/dL
Specific Gravity, Urine: 1.005 (ref 1.005–1.030)
pH: 5 (ref 5.0–8.0)

## 2019-08-28 LAB — CBG MONITORING, ED
Glucose-Capillary: 88 mg/dL (ref 70–99)
Glucose-Capillary: 91 mg/dL (ref 70–99)
Glucose-Capillary: 93 mg/dL (ref 70–99)

## 2019-08-28 LAB — HEMOGLOBIN AND HEMATOCRIT, BLOOD
HCT: 21.9 % — ABNORMAL LOW (ref 39.0–52.0)
Hemoglobin: 7.3 g/dL — ABNORMAL LOW (ref 13.0–17.0)

## 2019-08-28 LAB — PREPARE RBC (CROSSMATCH)

## 2019-08-28 LAB — FOLATE: Folate: 13.8 ng/mL (ref >5.9)

## 2019-08-28 LAB — FERRITIN: Ferritin: 366 ng/mL — ABNORMAL HIGH (ref 24–336)

## 2019-08-28 LAB — TROPONIN I (HIGH SENSITIVITY)
Troponin I (High Sensitivity): 3 ng/L (ref <18)
Troponin I (High Sensitivity): 3 ng/L (ref <18)

## 2019-08-28 LAB — VITAMIN B12: Vitamin B-12: 470 pg/mL (ref 180–914)

## 2019-08-28 MED ORDER — PANTOPRAZOLE SODIUM 40 MG IV SOLR
40.0000 mg | Freq: Two times a day (BID) | INTRAVENOUS | Status: DC
  Administered 2019-08-28 – 2019-09-02 (×11): 40 mg via INTRAVENOUS
  Filled 2019-08-28 (×11): qty 40

## 2019-08-28 MED ORDER — SODIUM CHLORIDE 0.9% IV SOLUTION: Freq: Once | INTRAVENOUS | Status: AC

## 2019-08-28 MED ORDER — SODIUM CHLORIDE 0.9% FLUSH
10.0000 mL | Freq: Two times a day (BID) | INTRAVENOUS | Status: DC
  Administered 2019-08-28 – 2019-09-10 (×9): 10 mL

## 2019-08-28 MED ORDER — SODIUM CHLORIDE 0.9% FLUSH
10.0000 mL | INTRAVENOUS | Status: DC | PRN
  Administered 2019-09-03 – 2019-09-10 (×2): 10 mL

## 2019-08-28 MED ORDER — LABETALOL HCL 5 MG/ML IV SOLN: 10.0000 mg | INTRAVENOUS | Status: DC | PRN

## 2019-08-28 MED ORDER — ASPIRIN 300 MG RE SUPP
300.0000 mg | Freq: Every day | RECTAL | Status: DC
  Filled 2019-08-28 (×2): qty 1

## 2019-08-28 MED ORDER — CHLORHEXIDINE GLUCONATE CLOTH 2 % EX PADS
6.0000 | MEDICATED_PAD | Freq: Every day | CUTANEOUS | Status: DC
  Administered 2019-08-29 – 2019-09-09 (×9): 6 via TOPICAL

## 2019-08-28 NOTE — Progress Notes (Signed)
Agree with assessment and plan as outlined. He is failing outpatient management, poor PO intake, continued weight loss, now with fevers, unable to function at home. He was taken to the hospital for admission and further management.

## 2019-08-28 NOTE — Progress Notes (Addendum)
Referring Provider: Dr. Tawanna Solo Primary Care Physician:  [facility], [facility] Primary Gastroenterologist:  Dr. Havery Moros  Reason for Consultation:  Necrotizing EtOH pancreatitis   HPI: Gary Atkinson is a 49 y.o. male with a past medical history of anxiety, depression, alcohol abuse, EtOH pancreatitis, hypertension, CAD, MI and stent placement. He was previously hospitalized 7/21 to 02/11/9240 with alcoholic pancreatitis with necrosis to the pancreatic head and ucinate process per abdominal MRI/MRCP. He underwent active alcohol withdrawal which required admission to the ICU on Precedex. He developed fevers and he received Meropenem IV. His fevers resolved and his N/V and abdominal pain stabilized. LFTs normalized. He was discharged home on 08/06/2019.  He was seen in our outpatient office by Alonza Bogus, PA-C yesterday for further follow-up. At that time, he reported having a fever of 102.88F with generalized weakness and persistent weight loss. He was sent to [facility] ED for further evaluation and hospital admission.  Since his hospital discharge 08/06/2019, he continued to have daily nausea, vomiting and generalized abdominal pain.  Anytime he attempted to eat solid food he developed nausea then vomited acid clear secretions.  No hematemesis.  He is passing black  mudl like diarrhea stools after eating solid food.  He stated his stools are "always black in color". He further clarified his stools have been black for the past 2 to 3 weeks.  No bright red rectal bleeding. No iron or Pepto bismal use. He takes ASA 48m daily. No NSAID use. He uses Tylenol with for his recent fevers. No alcohol since his hospital admission 07/23/2019.  He complains of progressive fatigue and generalized weakness.  He reports losing 66 pounds since the end of July 2021.  On and off fevers for the past 10 days.  In the ED, his WBC was 10.6.  Hemoglobin 10.7.  Alk phos 159.  Lipase and LFTs were normal.   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  The common bile duct is dilated 9 mm which is developed since his prior image study as well.  Monica thickening at the hepatic and splenic flexure were noted.  Hepatic steatosis noted.  He was started on Meropenem IV and aggressive IV hydration.  A GI consult was requested for further necrotizing pancreatitis management.  ED course: Sodium 128. Potassium 3.9. Glucose 111. BUN eight. Creatinine 0.65. Alk phos 159 ( Alk phos 92 on 08/06/2019). Albumin 2.2. Lipase 27. AST 72. ALT 52. Total bili 1.0. WBC 10.6. Hemoglobin 10.7 (Hg 8.3 on 08/06/2019). Hematocrit 32.0. MCV 90.4. Platelet 559. INR 1.2. Lactic acid 1.4. SARS coronavirus 2 negative.   Abdominal/pelvic CT with contrast 08/27/2019: 1. Walled-off necrosis with further organization of necrotic pancreatic collections about the pancreas tracking into splenic hilum and showing further necrosis of the gland at greater than 50% necrosis. Findings suspicious for acute pancreatitis superimposed on sequela of previous pancreatitis discussed above. 2. Small locules of gas in the pancreatic head within areas of walled-off necrosis suspicious for early infection. 3. Occlusion of the splenic vein. Narrowing of the main portal vein, further narrowing of the peripheral main portal vein since the prior study. This results in interval development of upper abdominal venous collaterals and gastric varices. 4. Biliary duct distension approximately 9 mm of the common bile duct has developed since the  prior study. The gallbladder is less distended than on the prior exam. No overt signs of acute cholecystitis though there is generalized inflammation in the upper abdomen.  Close attention on follow-up. 5. Colonic thickening at the hepatic flexure and splenic flexure similar at the a hepatic flexure but improved with respect to splenic flexure thickening when compared to the previous study. 6. Small volume ascites in the pelvis. Diminished when compared to the prior study. 7. Marked hepatic steatosis. 8. Aortic atherosclerosis. Aortic Atherosclerosis   Abdominal/Pelvic CT with contrast 07/31/2019: 1. Since 07/23/2019, increase in moderate peripancreatic fluid, consistent with acute pancreatitis. Areas of hypoenhancement within the pancreatic head/uncinate process are consistent with a involving necrosis. Well-defined fluid adjacent the pancreatic tail, for which developing pseudocyst cannot be excluded. 2. Increase in small volume abdominal pelvic ascites and anasarca. 3. New bibasilar airspace disease, favoring atelectasis. 4. Similar small bilateral pleural effusions. 5. Hepatic steatosis. 6. Left and possible right-sided colonic wall thickening indicative of colitis. This could be secondary to peripancreatic inflammation. 7. 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 No acute portal or splenic vein thrombus. 8. Aortic Atherosclerosis   Abdominal MRI with MRCP w/wo 07/24/2019: 1. Acute hemorrhagic necrotizing pancreatitis involving approximately 50% of the pancreatic parenchyma. No discrete measurable peripancreatic fluid collections. 2. No biliary ductal dilatation. No cholelithiasis or choledocholithiasis. 3. Prominent diffuse hepatic steatosis. 4. Small dependent bilateral pleural effusions, new. Small volume abdominal ascites. 5. Patulous lower thoracic esophagus with air-fluid level suggesting esophageal dysmotility and/or gastroesophageal reflux.   EGD 12/17/2018: - Esophagogastric landmarks identified. - 1 cm hiatal hernia. - Normal esophagus otherwise - biopsies taken to rule out eosinophilic esophagitis -  Erythematous mucosa in the gastric body and antrum. - Normal stomach otherwise - biopsies taken to rule out H pylori - Normal duodenal bulb and second portion of the duodenum. No erosive changes noted, the patient has non-erosive reflux disease (NERD) 1. Surgical [P], gastric antrum and gastric body - REACTIVE GASTROPATHY - NO H. PYLORI OR INTESTINAL METAPLASIA IDENTIFIED 2. Surgical [P], esophageal BX - REACTIVE SQUAMOUS MUCOSA - NO INCREASED INTRAEPITHELIAL EOSINOPHILS   Past Medical History:  Diagnosis Date  . Acute pancreatitis   . Allergic rhinitis   . Anxiety   . Asthma   . CAD (coronary artery disease)   . Depression   . GERD (gastroesophageal reflux disease)   . Hyperlipemia   . Hypertension   . MI (myocardial infarction) ([place])   . Pneumonia     Past Surgical History:  Procedure Laterality Date  . APPENDECTOMY    . CARDIAC CATHETERIZATION     with 2 stents    Prior to Admission medications   Medication Sig Start Date End Date Taking? Authorizing Provider  acetaminophen (TYLENOL) 325 MG tablet Take 650 mg by mouth every 6 (six) hours as needed for mild pain or headache.   Yes [provider]  Ascorbic Acid (VITAMIN C PO) Take 1 tablet by mouth every evening.    Yes [provider]  aspirin 81 MG tablet Take 81 mg by mouth at bedtime.   Yes [provider]  busPIRone (BUSPAR) 10 MG tablet Take 30 mg by mouth at bedtime.  07/08/19  Yes [provider]  clonazePAM (KLONOPIN) 1 MG disintegrating tablet Take 1 mg by mouth at bedtime.  07/08/19  Yes [provider]  dexlansoprazole (DEXILANT) 60 MG capsule Take 1 capsule (60 mg total) by mouth daily. Patient taking differently: Take 60 mg by mouth every evening.  01/09/19  Yes Armbruster, Carlota Raspberry, MD  lisinopril (ZESTRIL) 10 MG tablet Take 1 tablet (10 mg total) by mouth daily. Patient taking differently: Take 10 mg by mouth every evening.  08/06/19  Yes Amin, Jeanella Flattery, MD   propranolol (INDERAL) 40 MG tablet Take 0.5 tablets (20 mg total) by mouth daily. 08/06/19 09/05/19 Yes Amin, Ankit Chirag, MD  QUEtiapine (SEROQUEL) 50 MG tablet Take 1 tablet (50 mg total) by mouth at bedtime. 08/06/19  Yes Amin, Jeanella Flattery, MD  thiamine 100 MG tablet Take 1 tablet (100 mg total) by mouth daily. Patient taking differently: Take 100 mg by mouth every evening.  08/06/19  Yes Amin, Jeanella Flattery, MD  topiramate (TOPAMAX) 25 MG tablet Take 25 mg by mouth every evening.  12/12/18  Yes [provider]  nitroGLYCERIN (NITROSTAT) 0.4 MG SL tablet Place 0.4 mg under the tongue as needed for chest pain.  02/23/13 07/24/19  [provider]    Current Facility-Administered Medications  Medication Dose Route Frequency Provider Last Rate Last Admin  . aspirin suppository 300 mg  300 mg Rectal Daily Rise Patience, MD      . heparin injection 5,000 Units  5,000 Units Subcutaneous Q8H Rise Patience, MD   5,000 Units at 08/28/19 0557  . labetalol (NORMODYNE) injection 10 mg  10 mg Intravenous Q2H PRN Rise Patience, MD      . lactated ringers infusion   Intravenous Continuous Rise Patience, MD 200 mL/hr at 08/28/19 0707 New Bag at 08/28/19 0707  . meropenem (MERREM) 1 g in sodium chloride 0.9 % 100 mL IVPB  1 g Intravenous Q8H Rise Patience, MD   Stopped at 08/28/19 [phone]  . morphine 2 MG/ML injection 2 mg  2 mg Intravenous Q3H PRN Rise Patience, MD   2 mg at 08/28/19 0557  . nitroGLYCERIN (NITROSTAT) SL tablet 0.4 mg  0.4 mg Sublingual PRN Rise Patience, MD      . ondansetron [facility]) tablet 4 mg  4 mg Oral Q6H PRN Rise Patience, MD       Or  . ondansetron [facility]) injection 4 mg  4 mg Intravenous Q6H PRN Rise Patience, MD   4 mg at 08/28/19 [id number]   Current Outpatient Medications  Medication Sig Dispense Refill  . acetaminophen (TYLENOL) 325 MG tablet Take 650 mg by mouth every 6 (six) hours as needed for mild pain or  headache.    . Ascorbic Acid (VITAMIN C PO) Take 1 tablet by mouth every evening.     Marland Kitchen aspirin 81 MG tablet Take 81 mg by mouth at bedtime.    . busPIRone (BUSPAR) 10 MG tablet Take 30 mg by mouth at bedtime.     . clonazePAM (KLONOPIN) 1 MG disintegrating tablet Take 1 mg by mouth at bedtime.     Marland Kitchen dexlansoprazole (DEXILANT) 60 MG capsule Take 1 capsule (60 mg total) by mouth daily. (Patient taking differently: Take 60 mg by mouth every evening. ) 30 capsule 1  . lisinopril (ZESTRIL) 10 MG tablet Take 1 tablet (10 mg total) by mouth daily. (Patient taking differently: Take 10 mg by mouth every evening. ) 30 tablet 0  . propranolol (INDERAL) 40 MG tablet Take 0.5 tablets (20 mg total) by mouth daily. 15 tablet 0  . QUEtiapine (SEROQUEL) 50 MG tablet Take 1 tablet (50 mg total) by mouth at bedtime. 30 tablet 0  . thiamine 100 MG tablet Take 1 tablet (100 mg total) by  mouth daily. (Patient taking differently: Take 100 mg by mouth every evening. ) 30 tablet 0  . topiramate (TOPAMAX) 25 MG tablet Take 25 mg by mouth every evening.     . nitroGLYCERIN (NITROSTAT) 0.4 MG SL tablet Place 0.4 mg under the tongue as needed for chest pain.       Allergies as of 08/27/2019  . (No Known Allergies)    Family History  Problem Relation Age of Onset  . Liver disease Father   . Alcoholism Father     Social History   Socioeconomic History  . Marital status: Single    Spouse name: Not on file  . Number of children: 0  . Years of education: Not on file  . Highest education level: Not on file  Occupational History  . Occupation: Press photographer  Tobacco Use  . Smoking status: Former Smoker    Types: Cigarettes    Quit date: 2002    Years since quitting: 19.6  . Smokeless tobacco: Never Used  Vaping Use  . Vaping Use: Never used  Substance and Sexual Activity  . Alcohol use: Not Currently  . Drug use: Never  . Sexual activity: Not on file  Other Topics Concern  . Not on file  Social History  Narrative  . Not on file   Social Determinants of Health   Financial Resource Strain:   . Difficulty of Paying Living Expenses: Not on file  Food Insecurity:   . Worried About Charity fundraiser in the Last Year: Not on file  . Ran Out of Food in the Last Year: Not on file  Transportation Needs:   . Lack of Transportation (Medical): Not on file  . Lack of Transportation (Non-Medical): Not on file  Physical Activity:   . Days of Exercise per Week: Not on file  . Minutes of Exercise per Session: Not on file  Stress:   . Feeling of Stress : Not on file  Social Connections:   . Frequency of Communication with Friends and Family: Not on file  . Frequency of Social Gatherings with Friends and Family: Not on file  . Attends Religious Services: Not on file  . Active Member of Clubs or Organizations: Not on file  . Attends Archivist Meetings: Not on file  . Marital Status: Not on file  Intimate Partner Violence:   . Fear of Current or Ex-Partner: Not on file  . Emotionally Abused: Not on file  . Physically Abused: Not on file  . Sexually Abused: Not on file    Review of Systems: Gen: See HPI. CV: Denies chest pain, palpitations or edema. Resp: Denies cough, shortness of breath of hemoptysis.  GI: See HPI. GU : Denies urinary burning, blood in urine, increased urinary frequency or incontinence. MS: Generalized weakness. Derm: Denies rash, itchiness, skin lesions or unhealing ulcers. Psych: Denies depression, anxiety or memory loss. Heme: Denies easy bruising, bleeding. Neuro:  Denies headaches, dizziness or paresthesias. Endo:  Denies any problems with DM, thyroid or adrenal function.  Physical Exam: Vital signs in last 24 hours: Temp:  [98.5 F (36.9 C)-102.5 F (39.2 C)] 98.5 F (36.9 C) (08/26 0435) Pulse Rate:  [88-121] 92 (08/26 0708) Resp:  [16-28] 20 (08/26 0708) BP: (95-139)/(66-89) 124/75 (08/26 0708) SpO2:  [93 %-100 %] 100 % (08/26 0708) Weight:   [30 kg] 64 kg (08/25 1431)   General: Ill appearing 49 year old male in no acute distress. Head:  Normocephalic and atraumatic. Eyes:  No scleral  icterus. Conjunctiva pink. Ears:  Normal auditory acuity. Nose:  No deformity, discharge or lesions. Mouth:  Dentition intact. No ulcers or lesions.  Neck:  Supple. No lymphadenopathy or thyromegaly.  Lungs: Breath sounds clear throughout, no crackles, wheezes or rhonchi. Heart: Regular rate and rhythm, no murmurs. Abdomen: Upper abdomen slightly firm with generalized tenderness.  No rebound or guarding.  Hypoactive bowel sounds all 4 quadrants. Rectal: No external hemorrhoids. No stool in the rectal vault. No mass. RN present during exam.  Musculoskeletal:  Symmetrical without gross deformities.  Pulses:  Normal pulses noted. Extremities:  Without clubbing or edema. Neurologic:  Alert and  oriented x4. No focal deficits.  Skin: Petechiae rash to his distal tibia/ankles bilaterally Psych:  Alert and cooperative. Normal mood and affect.  Intake/Output from previous day: 08/25 0701 - 08/26 0700 In: 2595.4 [I.V.:407.5; IV Piggyback:2187.9] Out: -  Intake/Output this shift: No intake/output data recorded.  Lab Results: Recent Labs    08/27/19 1546 08/27/19 2239 08/28/19 0417  WBC 10.6* 10.6* 4.5  HGB 10.7* 11.0* 7.2*  HCT 32.0* 33.3* 21.6*  PLT 559* 507* 287   BMET Recent Labs    08/27/19 1546 08/28/19 0417  NA 128* 130*  K 3.9 4.0  CL 93* 97*  CO2 24 25  GLUCOSE 111* 98  BUN 8 9  CREATININE 0.65 0.69  CALCIUM 8.3* 7.7*   LFT Recent Labs    08/28/19 0417  PROT 5.9*  ALBUMIN 1.6*  AST 44*  ALT 35  ALKPHOS 100  BILITOT 1.1  BILIDIR 0.3*  IBILI 0.8   PT/INR Recent Labs    08/27/19 2000  LABPROT 15.1  INR 1.2   Hepatitis Panel No results for input(s): HEPBSAG, HCVAB, HEPAIGM, HEPBIGM in the last 72 hours.    Studies/Results: CT ABDOMEN PELVIS W CONTRAST  Result Date: 08/27/2019 CLINICAL DATA:  History  of necrotic pancreatitis diagnosed in July, presents with abdominal pain. EXAM: CT ABDOMEN AND PELVIS WITH CONTRAST TECHNIQUE: Multidetector CT imaging of the abdomen and pelvis was performed using the standard protocol following bolus administration of intravenous contrast. CONTRAST:  167m OMNIPAQUE IOHEXOL 300 MG/ML  SOLN COMPARISON:  Multiple prior studies, most recent comparison from July 31, 2019 FINDINGS: Lower chest: Lung bases are clear. No consolidation. No pleural effusion. Hepatobiliary: Marked hepatic steatosis. Main portal vein remains patent, see below. Biliary duct distension with transition in the pancreatic head, this is developed since the previous exam. Stranding generalize in the upper abdomen some of which is adjacent to the gallbladder. This is of uncertain significance Pancreas: Occlusion of splenic vein. Narrowing of the main portal vein, further narrowing of the peripheral main portal vein since the prior study. (Image 28, series 2) collateral formation with numerous gastroepiploic collaterals and gastric varices which have developed since the previous exam. 3.1 x 1.9 cm collection in the anterior pancreas does contain a small amount of gas compatible with walled-off necrosis involving the pancreatic head in the pancreatic-duodenal groove. Further necrosis of the gland. Walled off necrosis in the tail of the pancreas measuring 5.7 x 5.0 cm (image 27, series 2) previously 6.7 x 7.2 cm. This extends into the root of the mesentery and the anterior pararenal space in the LEFT hemiabdomen. Abundant stranding about the pancreas and duodenum. SMV is patent as described above. Spleen: Spleen is enlarged. Small amount of perisplenic fluid and fluid in the gastrohepatic recess. Wall up necrosis extends into the splenic hilum. Adrenals/Urinary Tract: Adrenal glands are normal. Symmetric renal enhancement. No hydronephrosis.  Urinary bladder is normal. Stomach/Bowel: Stomach with gastric varices. Peri  duodenal stranding and stranding throughout the root of the small bowel mesentery No sign of bowel obstruction. Colonic thickening at the hepatic flexure and splenic flexure similar at the a hepatic flexure but improved with respect to splenic flexure thickening when compared to the previous study. Vascular/Lymphatic: Calcified and noncalcified atheromatous plaque of the abdominal aorta. No adenopathy in the retroperitoneum. No adenopathy in the pelvis. Reproductive: Prostate unremarkable by CT. Other: Small volume ascites in the pelvis. Diminished when compared to the prior study. Musculoskeletal: No acute musculoskeletal process. No destructive bone finding. IMPRESSION: 1. Walled-off necrosis with further organization of necrotic pancreatic collections about the pancreas tracking into splenic hilum and showing further necrosis of the gland at greater than 50% necrosis. Findings suspicious for acute pancreatitis superimposed on sequela of previous pancreatitis discussed above. 2. Small locules of gas in the pancreatic head within areas of walled-off necrosis suspicious for early infection. 3. Occlusion of the splenic vein. Narrowing of the main portal vein, further narrowing of the peripheral main portal vein since the prior study. This results in interval development of upper abdominal venous collaterals and gastric varices. 4. Biliary duct distension approximately 9 mm of the common bile duct has developed since the prior study. The gallbladder is less distended than on the prior exam. No overt signs of acute cholecystitis though there is generalized inflammation in the upper abdomen. Close attention on follow-up. 5. Colonic thickening at the hepatic flexure and splenic flexure similar at the a hepatic flexure but improved with respect to splenic flexure thickening when compared to the previous study. 6. Small volume ascites in the pelvis. Diminished when compared to the prior study. 7. Marked hepatic steatosis.  8. Aortic atherosclerosis. Aortic Atherosclerosis (ICD10-I70.0). Electronically Signed   By: Zetta Bills M.D.   On: 08/27/2019 21:24   DG Chest Port 1 View  Result Date: 08/27/2019 CLINICAL DATA:  Possible sepsis EXAM: PORTABLE CHEST 1 VIEW COMPARISON:  08/01/2019 FINDINGS: The heart size and mediastinal contours are within normal limits. Both lungs are clear. The visualized skeletal structures are unremarkable. IMPRESSION: Negative. Electronically Signed   By: Rolm Baptise M.D.   On: 08/27/2019 19:35    IMPRESSION/PLAN:  78.  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 CTAP showed persistent walled off pancreatic necrosis > 50% of the pancreas with a small locules of gas in the pancreatic head concerning for infectious process.  WBC 10.5 -> 4.5. Temp 102.35F on 8/25. Afebrile today. Hemodynamically stable.  -NPO with ice chip -IV fluids per the hospitalist -Agree with Meropenem 1 g IV every 8 hours -Consider TPN -Pain management per the  hospitalist -Repeat CBC and CMP in a.m.  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  On heparin 5000 units subcu every 8 hours.  3.  Normocytic anemia.  Hemoglobin 7.2 (Hg 11.0 on 8/25).  MCV 90.0. Dilutional component. History of black melenic stools x 2 to 3 weeks. No BM since arriving to the ED. No stool in the rectum on exam today.  -Pantoprazole 71m IV bid -Repeat CBC in AM, check stat H/H if he develops any overt GI bleeding  -Check iron panel and B12 levels -FOBT -Eventual EGD and colonoscopy -History of CAD, defer decision to transfuse to the hospitalist   Further recommendations per Dr. DJoseph PieriniMDorathy Daft 08/28/2019, 8:21 AM  I have reviewed  the entire case in detail with the above APP and discussed the plan in detail.  Therefore, I agree with the diagnoses recorded above. In addition,  I have personally interviewed and  examined the patient and have personally reviewed any abdominal/pelvic CT scan images.  My additional thoughts are as follows:  Recently worsening and severe protracted case of alcohol related pancreatitis with severe and probably infected parenchymal necrosis based on overall clinical picture. He is clinically stable, looks very unwell and has clearly lost a significant amount of weight.  I believe he is in for a protracted recovery.  I spoke with his hospitalist earlier today, and we agreed to get PICC line placement, which was done this afternoon, and then start TPN as soon as possible.  I agree with meropenem for infected pancreatic necrosis.  The CT findings of mesenteric and splenic vein stenosis or occlusion by this severe pancreatitis along with the development of upper abdominal varices is quite worrisome.  This raises risk of GI bleeding. As such, I discontinued his subQ heparin and put him on SCDs instead.  I believe his anemia is a combination of some recently described GI bleeding in the way of melena and also the acute illness itself.  He has history of coronary stent many years ago, so hemoglobin of 7 increases his risk of cardiac ischemia.  I have ordered 1 unit of PRBCs and he was agreeable after discussion of risks and benefits.  Risks of transfusion reaction and infection were reviewed.  Ice chips and sips of water OK  We will follow with you.  Nelida Meuse III Office:[phone]

## 2019-08-28 NOTE — ED Notes (Signed)
Pt made aware of need for stool sample and will let us know when he goes.

## 2019-08-28 NOTE — ED Notes (Signed)
ED TO INPATIENT HANDOFF REPORT  ED Nurse Name and Phone #: Josph Norfleet  S Name/Age/Gender Gary Atkinson 49 y.o. male Room/Bed: WA21/WA21  Code Status   Code Status: Full Code  Home/SNF/Other Home Patient oriented to: self Is this baseline? Yes   Triage Complete: Triage complete  Chief Complaint Acute pancreatitis [K85.90]  Triage Note Per [facility] pt from [facility] for further work up that can be done in the office for pt's abd pains with n/d. Been going on since July when admitted here. Hx pancreatic necrosis. Vitals: 126/84 HR 108, R20, 97.8 CBG 101, 16R    Allergies No Known Allergies  Level of Care/Admitting Diagnosis ED Disposition    ED Disposition Condition Comment   Admit  Hospital Area: [facility] [[id number]]  Level of Care: Telemetry [5]  Admit to tele based on following criteria: Complex arrhythmia (Bradycardia/Tachycardia)  May admit patient to [facility] or [facility] if equivalent level of care is available:: Yes  Covid Evaluation: Confirmed COVID Negative  Diagnosis: Acute pancreatitis [577.0.ICD-9-CM]  Admitting Physician: Burnadette Pop [[id number]]  Attending Physician: Burnadette Pop [[id number]]  Estimated length of stay: past midnight tomorrow  Certification:: I certify this patient will need inpatient services for at least 2 midnights       B Medical/Surgery History Past Medical History:  Diagnosis Date  . Acute pancreatitis   . Allergic rhinitis   . Anxiety   . Asthma   . CAD (coronary artery disease)   . Depression   . GERD (gastroesophageal reflux disease)   . Hyperlipemia   . Hypertension   . MI (myocardial infarction) (HCC)   . Pneumonia    Past Surgical History:  Procedure Laterality Date  . APPENDECTOMY    . CARDIAC CATHETERIZATION     with 2 stents     A IV Location/Drains/Wounds Patient Lines/Drains/Airways Status    Active Line/Drains/Airways    Name Placement date Placement time Site Days    Peripheral IV 08/27/19 Right Antecubital 08/27/19  2007  Antecubital  1          Intake/Output Last 24 hours  Intake/Output Summary (Last 24 hours) at 08/28/2019 1326 Last data filed at 08/28/2019 1053 Gross per 24 hour  Intake 2595.38 ml  Output 300 ml  Net 2295.38 ml    Labs/Imaging Results for orders placed or performed during the hospital encounter of 08/27/19 (from the past 48 hour(s))  Lipase, blood     Status: None   Collection Time: 08/27/19  3:46 PM  Result Value Ref Range   Lipase 27 11 - 51 U/L    Comment: Performed at [facility], [address] [place], [place] [address]  Comprehensive metabolic panel     Status: Abnormal   Collection Time: 08/27/19  3:46 PM  Result Value Ref Range   Sodium 128 (L) 135 - 145 mmol/L   Potassium 3.9 3.5 - 5.1 mmol/L   Chloride 93 (L) 98 - 111 mmol/L   CO2 24 22 - 32 mmol/L   Glucose, Bld 111 (H) 70 - 99 mg/dL    Comment: Glucose reference range applies only to samples taken after fasting for at least 8 hours.   BUN 8 6 - 20 mg/dL   Creatinine, Ser 8.29 0.61 - 1.24 mg/dL   Calcium 8.3 (L) 8.9 - 10.3 mg/dL   Total Protein 8.0 6.5 - 8.1 g/dL   Albumin 2.2 (L) 3.5 - 5.0 g/dL   AST 72 (H) 15 - 41  U/L   ALT 52 (H) 0 - 44 U/L   Alkaline Phosphatase 159 (H) 38 - 126 U/L   Total Bilirubin 1.0 0.3 - 1.2 mg/dL   GFR calc non Af Amer >60 >60 mL/min   GFR calc Af Amer >60 >60 mL/min   Anion gap 11 5 - 15    Comment: Performed at [facility], [address] [place], [place] [address]  CBC     Status: Abnormal   Collection Time: 08/27/19  3:46 PM  Result Value Ref Range   WBC 10.6 (H) 4.0 - 10.5 K/uL   RBC 3.54 (L) 4.22 - 5.81 MIL/uL   Hemoglobin 10.7 (L) 13.0 - 17.0 g/dL   HCT 60.7 (L) 39 - 52 %   MCV 90.4 80.0 - 100.0 fL   MCH 30.2 26.0 - 34.0 pg   MCHC 33.4 30.0 - 36.0 g/dL   RDW 37.1 06.2 - 69.4 %   Platelets 559 (H) 150 - 400 K/uL   nRBC 0.0 0.0 - 0.2 %    Comment: Performed at [facility], [address], [place], [place] [address]  SARS Coronavirus 2 by RT PCR (hospital order, performed in [facility] hospital lab) Nasopharyngeal Nasopharyngeal Swab     Status: None   Collection Time: 08/27/19  7:19 PM   Specimen: Nasopharyngeal Swab  Result Value Ref Range   SARS Coronavirus 2 NEGATIVE NEGATIVE    Comment: (NOTE) SARS-CoV-2 target nucleic acids are NOT DETECTED.  The SARS-CoV-2 RNA is generally detectable in upper and lower respiratory specimens during the acute phase of infection. The lowest concentration of SARS-CoV-2 viral copies this assay can detect is 250 copies / mL. A negative result does not preclude SARS-CoV-2 infection and should not be used as the sole basis for treatment or other patient management decisions.  A negative result may occur with improper specimen collection / handling, submission of specimen other than nasopharyngeal swab, presence of viral mutation(s) within the areas targeted by this assay, and inadequate number of viral copies (<250 copies / mL). A negative result must be combined with clinical observations, patient history, and epidemiological information.  Fact Sheet for Patients:   BoilerBrush.com.cy  Fact Sheet for Healthcare Providers: https://pope.com/  This test is not yet approved or  cleared by the [place] FDA and has been authorized for detection and/or diagnosis of SARS-CoV-2 by FDA under an Emergency Use Authorization (EUA).  This EUA will remain in effect (meaning this test can be used) for the duration of the COVID-19 declaration under Section 564(b)(1) of the Act, 21 U.S.C. section 360bbb-3(b)(1), unless the authorization is terminated or revoked sooner.  Performed at [facility] [address], [place], [place] [address]   Lactic acid, plasma     Status: None   Collection Time: 08/27/19  8:00 PM  Result Value Ref  Range   Lactic Acid, Venous 1.4 0.5 - 1.9 mmol/L    Comment: Performed at [facility], [address], [place], [place] [address]  Protime-INR     Status: None   Collection Time: 08/27/19  8:00 PM  Result Value Ref Range   Prothrombin Time 15.1 11.4 - 15.2 seconds   INR 1.2 0.8 - 1.2    Comment: (NOTE) INR goal varies based on device and disease states. Performed at [facility], [address], [place], [place] [address]   APTT     Status: None   Collection Time:  08/27/19  8:00 PM  Result Value Ref Range   aPTT 31 24 - 36 seconds    Comment: Performed at [facility], [address] [place], [place] [address]  Blood culture (routine single)     Status: None (Preliminary result)   Collection Time: 08/27/19  8:00 PM   Specimen: BLOOD  Result Value Ref Range   Specimen Description      BLOOD RIGHT ANTECUBITAL Performed at [facility], [address] [place], [place] [address]    Special Requests      BOTTLES DRAWN AEROBIC AND ANAEROBIC Blood Culture adequate volume Performed at [facility], [address], [place], [place] [address]    Culture      NO GROWTH < 12 HOURS Performed at [facility], [address] [place], [place] [address]    Report Status PENDING   Lactic acid, plasma     Status: None   Collection Time: 08/27/19 10:09 PM  Result Value Ref Range   Lactic Acid, Venous 1.3 0.5 - 1.9 mmol/L    Comment: Performed at [facility], [address], [place], [place] [address]  CBC     Status: Abnormal   Collection Time: 08/27/19 10:39 PM  Result Value Ref Range   WBC 10.6 (H) 4.0 - 10.5 K/uL   RBC 3.65 (L) 4.22 - 5.81 MIL/uL   Hemoglobin 11.0 (L) 13.0 - 17.0 g/dL   HCT 13.0 (L) 39 - 52 %   MCV 91.2 80.0 - 100.0 fL   MCH 30.1 26.0 - 34.0 pg   MCHC 33.0 30.0 - 36.0 g/dL   RDW 86.5 78.4 - 69.6 %   Platelets 507 (H) 150 - 400 K/uL   nRBC 0.0  0.0 - 0.2 %    Comment: Performed at [facility], [address] [place], [place] [address]  CBG monitoring, ED     Status: None   Collection Time: 08/28/19 12:53 AM  Result Value Ref Range   Glucose-Capillary 91 70 - 99 mg/dL    Comment: Glucose reference range applies only to samples taken after fasting for at least 8 hours.  Urinalysis, Routine w reflex microscopic Urine, Clean Catch     Status: Abnormal   Collection Time: 08/28/19  4:17 AM  Result Value Ref Range   Color, Urine YELLOW YELLOW   APPearance CLEAR CLEAR   Specific Gravity, Urine 1.005 1.005 - 1.030   pH 5.0 5.0 - 8.0   Glucose, UA NEGATIVE NEGATIVE mg/dL   Hgb urine dipstick NEGATIVE NEGATIVE   Bilirubin Urine NEGATIVE NEGATIVE   Ketones, ur 5 (A) NEGATIVE mg/dL   Protein, ur NEGATIVE NEGATIVE mg/dL   Nitrite NEGATIVE NEGATIVE   Leukocytes,Ua NEGATIVE NEGATIVE    Comment: Performed at [facility], [address] [place] [address], [place], [place] [address]  Basic metabolic panel     Status: Abnormal   Collection Time: 08/28/19  4:17 AM  Result Value Ref Range   Sodium 130 (L) 135 - 145 mmol/L   Potassium 4.0 3.5 - 5.1 mmol/L   Chloride 97 (L) 98 - 111 mmol/L   CO2 25 22 - 32 mmol/L   Glucose, Bld 98 70 - 99 mg/dL    Comment: Glucose reference range applies only to samples taken after fasting for at least 8 hours.   BUN 9 6 - 20 mg/dL   Creatinine, Ser 4.01 0.61 - 1.24 mg/dL   Calcium 7.7 (L)  8.9 - 10.3 mg/dL   GFR calc non Af Amer >60 >60 mL/min   GFR calc Af Amer >60 >60 mL/min   Anion gap 8 5 - 15    Comment: Performed at [facility], [address] [place], [place] [address]  CBC     Status: Abnormal   Collection Time: 08/28/19  4:17 AM  Result Value Ref Range   WBC 4.5 4.0 - 10.5 K/uL   RBC 2.40 (L) 4.22 - 5.81 MIL/uL   Hemoglobin 7.2 (L) 13.0 - 17.0 g/dL    Comment: REPEATED TO VERIFY   HCT 21.6 (L) 39 - 52 %   MCV 90.0 80.0 - 100.0 fL   MCH 30.0 26.0 -  34.0 pg   MCHC 33.3 30.0 - 36.0 g/dL   RDW 29.514.6 62.111.5 - 30.815.5 %   Platelets 287 150 - 400 K/uL   nRBC 0.0 0.0 - 0.2 %    Comment: Performed at [facility], [address] [place], [place] [address]  Hepatic function panel     Status: Abnormal   Collection Time: 08/28/19  4:17 AM  Result Value Ref Range   Total Protein 5.9 (L) 6.5 - 8.1 g/dL   Albumin 1.6 (L) 3.5 - 5.0 g/dL   AST 44 (H) 15 - 41 U/L   ALT 35 0 - 44 U/L   Alkaline Phosphatase 100 38 - 126 U/L   Total Bilirubin 1.1 0.3 - 1.2 mg/dL   Bilirubin, Direct 0.3 (H) 0.0 - 0.2 mg/dL   Indirect Bilirubin 0.8 0.3 - 0.9 mg/dL    Comment: Performed at [facility], [address] [place], [place] [address]  Troponin I (High Sensitivity)     Status: None   Collection Time: 08/28/19  4:17 AM  Result Value Ref Range   Troponin I (High Sensitivity) 3 <18 ng/L    Comment: (NOTE) Elevated high sensitivity troponin I (hsTnI) values and significant  changes across serial measurements may suggest ACS but many other  chronic and acute conditions are known to elevate hsTnI results.  Refer to the "Links" section for chest pain algorithms and additional  guidance. Performed at [facility], [address] [place], [place] [address]   CBG monitoring, ED     Status: None   Collection Time: 08/28/19  5:51 AM  Result Value Ref Range   Glucose-Capillary 93 70 - 99 mg/dL    Comment: Glucose reference range applies only to samples taken after fasting for at least 8 hours.  Hemoglobin and hematocrit, blood     Status: Abnormal   Collection Time: 08/28/19  9:37 AM  Result Value Ref Range   Hemoglobin 7.3 (L) 13.0 - 17.0 g/dL   HCT 32.421.9 (L) 39 - 52 %    Comment: Performed at [facility], [address] [place], [place] [address]  Iron and TIBC     Status: Abnormal   Collection Time: 08/28/19  9:37 AM  Result Value Ref Range   Iron 21 (L) 45 - 182 ug/dL   TIBC 725100  (L) 366250 - 440450 ug/dL   Saturation Ratios 21 17.9 - 39.5 %   UIBC 79 ug/dL    Comment: Performed at [facility], [address] [place], [place] [address]  Ferritin     Status: Abnormal   Collection Time: 08/28/19  9:37 AM  Result Value Ref Range   Ferritin 366 (H) 24 - 336 ng/mL  Comment: Performed at [facility], [address], [place], [place] [address]  Vitamin B12     Status: None   Collection Time: 08/28/19  9:37 AM  Result Value Ref Range   Vitamin B-12 470 180 - 914 pg/mL    Comment: (NOTE) This assay is not validated for testing neonatal or myeloproliferative syndrome specimens for Vitamin B12 levels. Performed at [facility], [address] [place], [place] [address]   Folate, serum, performed at [facility] lab     Status: None   Collection Time: 08/28/19  9:37 AM  Result Value Ref Range   Folate 13.8 >5.9 ng/mL    Comment: Performed at [facility], [address], [place], [place] [address]  Troponin I (High Sensitivity)     Status: None   Collection Time: 08/28/19  9:38 AM  Result Value Ref Range   Troponin I (High Sensitivity) 3 <18 ng/L    Comment: (NOTE) Elevated high sensitivity troponin I (hsTnI) values and significant  changes across serial measurements may suggest ACS but many other  chronic and acute conditions are known to elevate hsTnI results.  Refer to the "Links" section for chest pain algorithms and additional  guidance. Performed at [facility], [address] [place], [place] [address]   CBG monitoring, ED     Status: None   Collection Time: 08/28/19 11:51 AM  Result Value Ref Range   Glucose-Capillary 88 70 - 99 mg/dL    Comment: Glucose reference range applies only to samples taken after fasting for at least 8 hours.   CT ABDOMEN PELVIS W CONTRAST  Result Date: 08/27/2019 CLINICAL DATA:  History of necrotic pancreatitis diagnosed in July,  presents with abdominal pain. EXAM: CT ABDOMEN AND PELVIS WITH CONTRAST TECHNIQUE: Multidetector CT imaging of the abdomen and pelvis was performed using the standard protocol following bolus administration of intravenous contrast. CONTRAST:  OMNIPAQUE IOHEXOL 300 MG/ML  SOLN COMPARISON:  Multiple prior studies, most recent comparison from July 31, 2019 FINDINGS: Lower chest: Lung bases are clear. No consolidation. No pleural effusion. Hepatobiliary: Marked hepatic steatosis. Main portal vein remains patent, see below. Biliary duct distension with transition in the pancreatic head, this is developed since the previous exam. Stranding generalize in the upper abdomen some of which is adjacent to the gallbladder. This is of uncertain significance Pancreas: Occlusion of splenic vein. Narrowing of the main portal vein, further narrowing of the peripheral main portal vein since the prior study. (Image 28, series 2) collateral formation with numerous gastroepiploic collaterals and gastric varices which have developed since the previous exam. 3.1 x 1.9 cm collection in the anterior pancreas does contain a small amount of gas compatible with walled-off necrosis involving the pancreatic head in the pancreatic-duodenal groove. Further necrosis of the gland. Walled off necrosis in the tail of the pancreas measuring 5.7 x 5.0 cm (image 27, series 2) previously 6.7 x 7.2 cm. This extends into the root of the mesentery and the anterior pararenal space in the LEFT hemiabdomen. Abundant stranding about the pancreas and duodenum. SMV is patent as described above. Spleen: Spleen is enlarged. Small amount of perisplenic fluid and fluid in the gastrohepatic recess. Wall up necrosis extends into the splenic hilum. Adrenals/Urinary Tract: Adrenal glands are normal. Symmetric renal enhancement. No hydronephrosis. Urinary bladder is normal. Stomach/Bowel: Stomach with gastric varices. Peri duodenal stranding and stranding throughout  the root of the small bowel mesentery No sign of bowel obstruction.  Colonic thickening at the hepatic flexure and splenic flexure similar at the a hepatic flexure but improved with respect to splenic flexure thickening when compared to the previous study. Vascular/Lymphatic: Calcified and noncalcified atheromatous plaque of the abdominal aorta. No adenopathy in the retroperitoneum. No adenopathy in the pelvis. Reproductive: Prostate unremarkable by CT. Other: Small volume ascites in the pelvis. Diminished when compared to the prior study. Musculoskeletal: No acute musculoskeletal process. No destructive bone finding. IMPRESSION: 1. Walled-off necrosis with further organization of necrotic pancreatic collections about the pancreas tracking into splenic hilum and showing further necrosis of the gland at greater than 50% necrosis. Findings suspicious for acute pancreatitis superimposed on sequela of previous pancreatitis discussed above. 2. Small locules of gas in the pancreatic head within areas of walled-off necrosis suspicious for early infection. 3. Occlusion of the splenic vein. Narrowing of the main portal vein, further narrowing of the peripheral main portal vein since the prior study. This results in interval development of upper abdominal venous collaterals and gastric varices. 4. Biliary duct distension approximately 9 mm of the common bile duct has developed since the prior study. The gallbladder is less distended than on the prior exam. No overt signs of acute cholecystitis though there is generalized inflammation in the upper abdomen. Close attention on follow-up. 5. Colonic thickening at the hepatic flexure and splenic flexure similar at the a hepatic flexure but improved with respect to splenic flexure thickening when compared to the previous study. 6. Small volume ascites in the pelvis. Diminished when compared to the prior study. 7. Marked hepatic steatosis. 8. Aortic atherosclerosis. Aortic  Atherosclerosis (ICD10-I70.0). Electronically Signed   By: Donzetta Kohut M.D.   On: 08/27/2019 21:24   DG Chest Port 1 View  Result Date: 08/27/2019 CLINICAL DATA:  Possible sepsis EXAM: PORTABLE CHEST 1 VIEW COMPARISON:  08/01/2019 FINDINGS: The heart size and mediastinal contours are within normal limits. Both lungs are clear. The visualized skeletal structures are unremarkable. IMPRESSION: Negative. Electronically Signed   By: Charlett Nose M.D.   On: 08/27/2019 19:35   [place] EKG SITE RITE  Result Date: 08/28/2019 If Site Rite image not attached, placement could not be confirmed due to current cardiac rhythm.   Pending Labs Unresulted Labs (From admission, onward)          Start     Ordered   08/28/19 1106  Occult blood card to lab, stool  ONCE - STAT,   STAT        08/28/19 1105   08/27/19 2145  Culture, blood (single)  (Undifferentiated -> Now sepsis confirmed (treatment and sepsis specific nursing orders))  ONCE - STAT,   STAT        08/27/19 2149   08/27/19 1854  Urine culture  (Undifferentiated presentation (screening labs and basic nursing orders))  ONCE - STAT,   STAT        08/27/19 1855          Vitals/Pain Today's Vitals   08/28/19 1200 08/28/19 1210 08/28/19 1230 08/28/19 1300  BP: 135/72 135/72 122/83 132/78  Pulse: 98 98 97 99  Resp: 20 (!) 26 19 18   Temp:      TempSrc:      SpO2: 99% 98% 100% 97%  PainSc:        Isolation Precautions No active isolations  Medications Medications  meropenem (MERREM) 1 g in sodium chloride 0.9 % 100 mL IVPB (0 g Intravenous Stopped 08/28/19 0623)  lactated ringers infusion ( Intravenous New Bag/Given  08/28/19 1146)  nitroGLYCERIN (NITROSTAT) SL tablet 0.4 mg (has no administration in time range)  ondansetron (ZOFRAN) tablet 4 mg ( Oral See Alternative 08/28/19 0948)    Or  ondansetron (ZOFRAN) injection 4 mg (4 mg Intravenous Given 08/28/19 0948)  heparin injection 5,000 Units (5,000 Units Subcutaneous Given 08/28/19 0557)   morphine 2 MG/ML injection 2 mg (2 mg Intravenous Given 08/28/19 0947)  aspirin suppository 300 mg (300 mg Rectal Refused 08/28/19 0939)  labetalol (NORMODYNE) injection 10 mg (has no administration in time range)  pantoprazole (PROTONIX) injection 40 mg (40 mg Intravenous Given 08/28/19 1146)  lactated ringers bolus 1,000 mL (0 mLs Intravenous Stopped 08/27/19 2051)  ketorolac (TORADOL) 30 MG/ML injection 30 mg (30 mg Intravenous Given 08/27/19 2023)  morphine 2 MG/ML injection 2 mg (2 mg Intravenous Given 08/27/19 2023)  ondansetron (ZOFRAN) injection 4 mg (4 mg Intravenous Given 08/27/19 2023)  iohexol (OMNIPAQUE) 300 MG/ML solution 100 mL (100 mLs Intravenous Contrast Given 08/27/19 2044)  lactated ringers bolus 1,000 mL (0 mLs Intravenous Stopped 08/27/19 2326)    Mobility walks Low fall risk   Focused Assessments    R Recommendations: See Admitting Provider Note  Report given to: Heather  Additional Notes:

## 2019-08-28 NOTE — Progress Notes (Signed)
PHARMACY - TOTAL PARENTERAL NUTRITION CONSULT NOTE   Indication: Severe pancreatitis  Patient Measurements:     There is no height or weight on file to calculate BMI. Usual Weight:  Current weight: 64 kg  Assessment:  Pt is a 10 yoM with PMH significant for CAD, recent admission for necrotizing pancreatitis, discharged from the hospital on 08/06/19. Pt has had worsening epigastric pain and difficulty tolerating PO. Pharmacy consulted to initiate TPN.   Glucose / Insulin: No history DM.  Electrolytes: K WNL Renal:  LFTs / TGs:  Prealbumin / albumin:  Intake / Output; MIVF:  GI Imaging: Surgeries / Procedures:   Central access: PICC line ordered TPN start date: 8/27 pending placement of central line  Nutritional Goals: Pending RD recommendations  Current Nutrition:  NPO  Plan:   TPN labs ordered with AM labs tomorrow  TPN will not be initiated today as pt does not have central line and consult was received after the deadline of 12:00.   Pharmacy to follow up labs and initiate TPN tomorrow pending central line placement.   Cindi Carbon, PharmD 08/28/2019,2:16 PM

## 2019-08-28 NOTE — Progress Notes (Signed)
Peripherally Inserted Central Catheter Placement  The IV Nurse has discussed with the patient and/or persons authorized to consent for the patient, the purpose of this procedure and the potential benefits and risks involved with this procedure.  The benefits include less needle sticks, lab draws from the catheter, and the patient may be discharged home with the catheter. Risks include, but not limited to, infection, bleeding, blood clot (thrombus formation), and puncture of an artery; nerve damage and irregular heartbeat and possibility to perform a PICC exchange if needed/ordered by physician.  Alternatives to this procedure were also discussed.  Bard Power PICC patient education guide, fact sheet on infection prevention and patient information card has been provided to patient /or left at bedside.    PICC Placement Documentation  PICC Double Lumen 08/28/19 PICC Right Basilic 37 cm 0 cm (Active)  Indication for Insertion or Continuance of Line Administration of hyperosmolar/irritating solutions (i.e. TPN, Vancomycin, etc.) 08/28/19 1632  Exposed Catheter (cm) 0 cm 08/28/19 1632  Site Assessment Clean;Dry;Intact 08/28/19 1632  Lumen #1 Status Flushed;Saline locked;Blood return noted 08/28/19 1632  Lumen #2 Status Flushed;Saline locked;Blood return noted 08/28/19 1632  Dressing Type Transparent 08/28/19 1632  Dressing Status Clean;Dry;Intact;Antimicrobial disc in place 08/28/19 1632  Safety Lock Not Applicable 08/28/19 1632  Dressing Intervention New dressing 08/28/19 1632  Dressing Change Due 09/04/19 08/28/19 1632       Gary Atkinson 08/28/2019, 4:34 PM

## 2019-08-28 NOTE — ED Notes (Signed)
Report called to [employer].  VSS. Will transport patient to 1413

## 2019-08-28 NOTE — Plan of Care (Signed)
  Problem: Education: Goal: Knowledge of medication regimen will be met for pain relief regimen by discharge Outcome: Progressing Goal: Understanding of ways to prevent infection will improve by discharge Outcome: Progressing   Problem: Coping: Goal: Ability to verbalize feelings will improve by discharge Outcome: Progressing Goal: Family members realistic understanding of the patients condition will improve by discharge Outcome: Progressing   Problem: Medication: Goal: Compliance with prescribed medication regimen will improve by discharge Outcome: Progressing   Problem: Pain Management: Goal: Satisfaction with pain management regimen will be met by discharge Outcome: Progressing

## 2019-08-28 NOTE — Progress Notes (Addendum)
PROGRESS NOTE    Gary Atkinson  ZOX:[id number] DOB: 05-29-1970 DOA: 08/27/2019 PCP: [facility]   Brief Narrative:  Patient is a 49 year old male with history of coronary artery disease status post stenting, recently admitted for acute necrotizing pancreatitis and was discharged on 08/06/2019 presented with persistent tachycardia, abdominal pain.  After discharge, patient has not been able to tolerate the food.  He was complaining of worsening pain on the epigastric area, was vomiting and also diarrhea.  On presentation he was found to be febrile.  CT abdomen/pelvis done in the emergency department showed acute on chronic pancreatitis with possible infection/organized necrotic tissue with possible obstruction of the splenic vein.  GI consulted.  Started on IV antibiotics, IV fluids  Assessment & Plan:   Principal Problem:   Pancreatitis, necrotizing Active Problems:   CAD (coronary artery disease), native coronary artery   Benign essential HTN   Pancreatic necrosis   Acute on chronic pancreatitis: Recently admitted for the management of alcoholic pancreatitis and was discharged on 08/06/2019.  He was following with GI as an outpatient.  CT imaging concerning for developing infection.  CT also showed possible obstruction of the significant inflammatory signs around the upper part of the abdomen.  Started on meropenem, IV fluids.  Currently NPO.  GI consulted.  Lipase is not significantly elevated. He was also febrile on presentation, currently afebrile. Will start him on TPN for suspicion of prolonged hospital course.  We will put a PICC line.  History of coronary artery disease: Status post stenting takes aspirin at home.  Hypertension :Currently  blood pressure stable.  Continue as needed medication for hypertension.  History of alcohol abuse: Has not taken any alcohol since last admission.  Continue thiamine, folic acid  Normocytic anemia: Hemoglobin dropped to the  range of 7.  No history of bleeding, hematochezia, melena.    Continue to monitor         DVT prophylaxis:heparin [place] Code Status: Full Family Communication: None at bedside Status is: Inpatient  Remains inpatient appropriate because:IV treatments appropriate due to intensity of illness or inability to take PO   Dispo: The patient is from: Home              Anticipated d/c is to: Home              Anticipated d/c date is: > 3 days              Patient currently is not medically stable to d/c.     Consultants: GI  Procedures:None  Antimicrobials:  Anti-infectives (From admission, onward)   Start     Dose/Rate Route Frequency Ordered Stop   08/27/19 2200  meropenem (MERREM) 1 g in sodium chloride 0.9 % 100 mL IVPB        1 g 200 mL/hr over 30 Minutes Intravenous Every 8 hours 08/27/19 2149        Subjective:  Patient seen and examined the bedside this morning.  Hemodynamically stable but looks very weak.  Complains of abdominal pain.  Afebrile during my evaluation.   Objective: Vitals:   08/28/19 0500 08/28/19 0515 08/28/19 0601 08/28/19 0708  BP: 132/77 130/78 139/73 124/75  Pulse: 91 93 95 92  Resp: (!) 21 (!) 21 20 20   Temp:      TempSrc:      SpO2: 100% 100% 100% 100%    Intake/Output Summary (Last 24 hours) at 08/28/2019 0834 Last data filed at 08/28/2019 0623 Gross per 24  hour  Intake 2595.38 ml  Output --  Net 2595.38 ml   There were no vitals filed for this visit.  Examination:  General exam: Chronically ill looking, generalized weakness HEENT:PERRL,Oral mucosa moist, Ear/Nose normal on gross exam Respiratory system: Bilateral equal air entry, normal vesicular breath sounds, no wheezes or crackles  Cardiovascular system: S1 & S2 heard, RRR. No JVD, murmurs, rubs, gallops or clicks. No pedal edema. Gastrointestinal system: Abdomen is nondistended, soft has generalized tenderness. No organomegaly or masses felt. Normal bowel sounds heard. Central  nervous system: Alert and oriented. No focal neurological deficits. Extremities: No edema, no clubbing ,no cyanosis Skin: No rashes, lesions or ulcers,no icterus ,no pallor   Data Reviewed: I have personally reviewed following labs and imaging studies  CBC: Recent Labs  Lab 08/27/19 1546 08/27/19 2239 08/28/19 0417  WBC 10.6* 10.6* 4.5  HGB 10.7* 11.0* 7.2*  HCT 32.0* 33.3* 21.6*  MCV 90.4 91.2 90.0  PLT 559* 507* 287   Basic Metabolic Panel: Recent Labs  Lab 08/27/19 1546 08/28/19 0417  NA 128* 130*  K 3.9 4.0  CL 93* 97*  CO2 24 25  GLUCOSE 111* 98  BUN 8 9  CREATININE 0.65 0.69  CALCIUM 8.3* 7.7*   GFR: Estimated Creatinine Clearance: 98.9 mL/min (by C-G formula based on SCr of 0.69 mg/dL). Liver Function Tests: Recent Labs  Lab 08/27/19 1546 08/28/19 0417  AST 72* 44*  ALT 52* 35  ALKPHOS 159* 100  BILITOT 1.0 1.1  PROT 8.0 5.9*  ALBUMIN 2.2* 1.6*   Recent Labs  Lab 08/27/19 1546  LIPASE 27   No results for input(s): AMMONIA in the last 168 hours. Coagulation Profile: Recent Labs  Lab 08/27/19 2000  INR 1.2   Cardiac Enzymes: No results for input(s): CKTOTAL, CKMB, CKMBINDEX, TROPONINI in the last 168 hours. BNP (last 3 results) No results for input(s): PROBNP in the last 8760 hours. HbA1C: No results for input(s): HGBA1C in the last 72 hours. CBG: Recent Labs  Lab 08/28/19 0053 08/28/19 0551  GLUCAP 91 93   Lipid Profile: No results for input(s): CHOL, HDL, LDLCALC, TRIG, CHOLHDL, LDLDIRECT in the last 72 hours. Thyroid Function Tests: No results for input(s): TSH, T4TOTAL, FREET4, T3FREE, THYROIDAB in the last 72 hours. Anemia Panel: No results for input(s): VITAMINB12, FOLATE, FERRITIN, TIBC, IRON, RETICCTPCT in the last 72 hours. Sepsis Labs: Recent Labs  Lab 08/27/19 2000 08/27/19 2209  LATICACIDVEN 1.4 1.3    Recent Results (from the past 240 hour(s))  SARS Coronavirus 2 by RT PCR (hospital order, performed in [facility] hospital lab) Nasopharyngeal Nasopharyngeal Swab     Status: None   Collection Time: 08/27/19  7:19 PM   Specimen: Nasopharyngeal Swab  Result Value Ref Range Status   SARS Coronavirus 2 NEGATIVE NEGATIVE Final    Comment: (NOTE) SARS-CoV-2 target nucleic acids are NOT DETECTED.  The SARS-CoV-2 RNA is generally detectable in upper and lower respiratory specimens during the acute phase of infection. The lowest concentration of SARS-CoV-2 viral copies this assay can detect is 250 copies / mL. A negative result does not preclude SARS-CoV-2 infection and should not be used as the sole basis for treatment or other patient management decisions.  A negative result may occur with improper specimen collection / handling, submission of specimen other than nasopharyngeal swab, presence of viral mutation(s) within the areas targeted by this assay, and inadequate number of viral copies (<250 copies / mL). A negative result must be combined with  clinical observations, patient history, and epidemiological information.  Fact Sheet for Patients:   BoilerBrush.com.cy  Fact Sheet for Healthcare Providers: https://pope.com/  This test is not yet approved or  cleared by the [place] FDA and has been authorized for detection and/or diagnosis of SARS-CoV-2 by FDA under an Emergency Use Authorization (EUA).  This EUA will remain in effect (meaning this test can be used) for the duration of the COVID-19 declaration under Section 564(b)(1) of the Act, 21 U.S.C. section 360bbb-3(b)(1), unless the authorization is terminated or revoked sooner.  Performed at [facility], [address], [place], [place] [address]   Blood culture (routine single)     Status: None (Preliminary result)   Collection Time: 08/27/19  8:00 PM   Specimen: BLOOD  Result Value Ref Range Status   Specimen Description   Final    BLOOD RIGHT  ANTECUBITAL Performed at [facility], [address], [place], [place] [address]    Special Requests   Final    BOTTLES DRAWN AEROBIC AND ANAEROBIC Blood Culture adequate volume Performed at [facility], [address] [place], [place] [address]    Culture   Final    NO GROWTH < 12 HOURS Performed at [facility], [address] [place], [place] [address]    Report Status PENDING  Incomplete         Radiology Studies: CT ABDOMEN PELVIS W CONTRAST  Result Date: 08/27/2019 CLINICAL DATA:  History of necrotic pancreatitis diagnosed in July, presents with abdominal pain. EXAM: CT ABDOMEN AND PELVIS WITH CONTRAST TECHNIQUE: Multidetector CT imaging of the abdomen and pelvis was performed using the standard protocol following bolus administration of intravenous contrast. CONTRAST:  OMNIPAQUE IOHEXOL 300 MG/ML  SOLN COMPARISON:  Multiple prior studies, most recent comparison from July 31, 2019 FINDINGS: Lower chest: Lung bases are clear. No consolidation. No pleural effusion. Hepatobiliary: Marked hepatic steatosis. Main portal vein remains patent, see below. Biliary duct distension with transition in the pancreatic head, this is developed since the previous exam. Stranding generalize in the upper abdomen some of which is adjacent to the gallbladder. This is of uncertain significance Pancreas: Occlusion of splenic vein. Narrowing of the main portal vein, further narrowing of the peripheral main portal vein since the prior study. (Image 28, series 2) collateral formation with numerous gastroepiploic collaterals and gastric varices which have developed since the previous exam. 3.1 x 1.9 cm collection in the anterior pancreas does contain a small amount of gas compatible with walled-off necrosis involving the pancreatic head in the pancreatic-duodenal groove. Further necrosis of the gland. Walled off necrosis in the tail of the pancreas measuring 5.7 x  5.0 cm (image 27, series 2) previously 6.7 x 7.2 cm. This extends into the root of the mesentery and the anterior pararenal space in the LEFT hemiabdomen. Abundant stranding about the pancreas and duodenum. SMV is patent as described above. Spleen: Spleen is enlarged. Small amount of perisplenic fluid and fluid in the gastrohepatic recess. Wall up necrosis extends into the splenic hilum. Adrenals/Urinary Tract: Adrenal glands are normal. Symmetric renal enhancement. No hydronephrosis. Urinary bladder is normal. Stomach/Bowel: Stomach with gastric varices. Peri duodenal stranding and stranding throughout the root of the small bowel mesentery No sign of bowel obstruction. Colonic thickening at the hepatic flexure and splenic flexure similar at the a hepatic flexure but improved with respect to splenic flexure thickening when compared to the previous study. Vascular/Lymphatic: Calcified and noncalcified atheromatous  plaque of the abdominal aorta. No adenopathy in the retroperitoneum. No adenopathy in the pelvis. Reproductive: Prostate unremarkable by CT. Other: Small volume ascites in the pelvis. Diminished when compared to the prior study. Musculoskeletal: No acute musculoskeletal process. No destructive bone finding. IMPRESSION: 1. Walled-off necrosis with further organization of necrotic pancreatic collections about the pancreas tracking into splenic hilum and showing further necrosis of the gland at greater than 50% necrosis. Findings suspicious for acute pancreatitis superimposed on sequela of previous pancreatitis discussed above. 2. Small locules of gas in the pancreatic head within areas of walled-off necrosis suspicious for early infection. 3. Occlusion of the splenic vein. Narrowing of the main portal vein, further narrowing of the peripheral main portal vein since the prior study. This results in interval development of upper abdominal venous collaterals and gastric varices. 4. Biliary duct distension  approximately 9 mm of the common bile duct has developed since the prior study. The gallbladder is less distended than on the prior exam. No overt signs of acute cholecystitis though there is generalized inflammation in the upper abdomen. Close attention on follow-up. 5. Colonic thickening at the hepatic flexure and splenic flexure similar at the a hepatic flexure but improved with respect to splenic flexure thickening when compared to the previous study. 6. Small volume ascites in the pelvis. Diminished when compared to the prior study. 7. Marked hepatic steatosis. 8. Aortic atherosclerosis. Aortic Atherosclerosis (ICD10-I70.0). Electronically Signed   By: Donzetta Kohut M.D.   On: 08/27/2019 21:24   DG Chest Port 1 View  Result Date: 08/27/2019 CLINICAL DATA:  Possible sepsis EXAM: PORTABLE CHEST 1 VIEW COMPARISON:  08/01/2019 FINDINGS: The heart size and mediastinal contours are within normal limits. Both lungs are clear. The visualized skeletal structures are unremarkable. IMPRESSION: Negative. Electronically Signed   By: Charlett Nose M.D.   On: 08/27/2019 19:35        Scheduled Meds: . aspirin  300 mg Rectal Daily  . heparin  5,000 Units Subcutaneous Q8H   Continuous Infusions: . lactated ringers 200 mL/hr at 08/28/19 0707  . meropenem (MERREM) IV Stopped (08/28/19 0623)     LOS: 1 day    Time spent: 35 mins,More than 50% of that time was spent in counseling and/or coordination of care.      Burnadette Pop, MD [facility] P8/26/2021, 8:34 AM

## 2019-08-29 DIAGNOSIS — E440 Moderate protein-calorie malnutrition: Secondary | ICD-10-CM | POA: Insufficient documentation

## 2019-08-29 LAB — CBC
HCT: 23.7 % — ABNORMAL LOW (ref 39.0–52.0)
Hemoglobin: 8 g/dL — ABNORMAL LOW (ref 13.0–17.0)
MCH: 29.9 pg (ref 26.0–34.0)
MCHC: 33.8 g/dL (ref 30.0–36.0)
MCV: 88.4 fL (ref 80.0–100.0)
Platelets: 267 10*3/uL (ref 150–400)
RBC: 2.68 MIL/uL — ABNORMAL LOW (ref 4.22–5.81)
RDW: 14.9 % (ref 11.5–15.5)
WBC: 4.4 10*3/uL (ref 4.0–10.5)
nRBC: 0 % (ref 0.0–0.2)

## 2019-08-29 LAB — COMPREHENSIVE METABOLIC PANEL
ALT: 31 U/L (ref 0–44)
AST: 31 U/L (ref 15–41)
Albumin: 1.6 g/dL — ABNORMAL LOW (ref 3.5–5.0)
Alkaline Phosphatase: 95 U/L (ref 38–126)
Anion gap: 11 (ref 5–15)
BUN: 5 mg/dL — ABNORMAL LOW (ref 6–20)
CO2: 22 mmol/L (ref 22–32)
Calcium: 7.2 mg/dL — ABNORMAL LOW (ref 8.9–10.3)
Chloride: 95 mmol/L — ABNORMAL LOW (ref 98–111)
Creatinine, Ser: 0.53 mg/dL — ABNORMAL LOW (ref 0.61–1.24)
GFR calc Af Amer: >60 mL/min (ref >60)
GFR calc non Af Amer: >60 mL/min (ref >60)
Glucose, Bld: 87 mg/dL (ref 70–99)
Potassium: 3.5 mmol/L (ref 3.5–5.1)
Sodium: 128 mmol/L — ABNORMAL LOW (ref 135–145)
Total Bilirubin: 1.2 mg/dL (ref 0.3–1.2)
Total Protein: 5.7 g/dL — ABNORMAL LOW (ref 6.5–8.1)

## 2019-08-29 LAB — GLUCOSE, CAPILLARY
Glucose-Capillary: 133 mg/dL — ABNORMAL HIGH (ref 70–99)
Glucose-Capillary: 140 mg/dL — ABNORMAL HIGH (ref 70–99)
Glucose-Capillary: 84 mg/dL (ref 70–99)
Glucose-Capillary: 88 mg/dL (ref 70–99)

## 2019-08-29 LAB — DIFFERENTIAL
Abs Immature Granulocytes: 0.03 10*3/uL (ref 0.00–0.07)
Basophils Absolute: 0 10*3/uL (ref 0.0–0.1)
Basophils Relative: 1 %
Eosinophils Absolute: 0.1 10*3/uL (ref 0.0–0.5)
Eosinophils Relative: 1 %
Immature Granulocytes: 1 %
Lymphocytes Relative: 18 %
Lymphs Abs: 0.8 10*3/uL (ref 0.7–4.0)
Monocytes Absolute: 0.5 10*3/uL (ref 0.1–1.0)
Monocytes Relative: 12 %
Neutro Abs: 3 10*3/uL (ref 1.7–7.7)
Neutrophils Relative %: 67 %

## 2019-08-29 LAB — PHOSPHORUS: Phosphorus: 4 mg/dL (ref 2.5–4.6)

## 2019-08-29 LAB — PREALBUMIN: Prealbumin: <5 mg/dL — ABNORMAL LOW (ref 18–38)

## 2019-08-29 LAB — URINE CULTURE: Culture: NO GROWTH

## 2019-08-29 LAB — MAGNESIUM: Magnesium: 1.2 mg/dL — ABNORMAL LOW (ref 1.7–2.4)

## 2019-08-29 LAB — TRIGLYCERIDES: Triglycerides: 82 mg/dL (ref <150)

## 2019-08-29 MED ORDER — SODIUM CHLORIDE 0.9 % IV SOLN
INTRAVENOUS | Status: DC
  Administered 2019-08-29: 100 mL/h via INTRAVENOUS

## 2019-08-29 MED ORDER — POTASSIUM CHLORIDE 10 MEQ/100ML IV SOLN
10.0000 meq | INTRAVENOUS | Status: AC
  Administered 2019-08-29 (×3): 10 meq via INTRAVENOUS
  Filled 2019-08-29 (×3): qty 100

## 2019-08-29 MED ORDER — TRAVASOL 10 % IV SOLN
INTRAVENOUS | Status: AC
  Filled 2019-08-29: qty 556.8

## 2019-08-29 MED ORDER — SODIUM CHLORIDE 0.9 % IV SOLN: INTRAVENOUS | Status: DC

## 2019-08-29 MED ORDER — MAGNESIUM SULFATE 4 GM/100ML IV SOLN
4.0000 g | Freq: Once | INTRAVENOUS | Status: AC
  Administered 2019-08-29: 4 g via INTRAVENOUS
  Filled 2019-08-29: qty 100

## 2019-08-29 MED ORDER — BOOST / RESOURCE BREEZE PO LIQD CUSTOM
1.0000 | Freq: Three times a day (TID) | ORAL | Status: DC
  Administered 2019-08-29 – 2019-09-02 (×2): 1 via ORAL

## 2019-08-29 MED ORDER — SODIUM CHLORIDE 0.9 % IV SOLN
510.0000 mg | Freq: Once | INTRAVENOUS | Status: AC
  Administered 2019-08-29: 510 mg via INTRAVENOUS
  Filled 2019-08-29: qty 510

## 2019-08-29 NOTE — Progress Notes (Signed)
Initial Nutrition Assessment  DOCUMENTATION CODES:   Non-severe (moderate) malnutrition in context of acute illness/injury  INTERVENTION:   Once TPN is initiated, monitor magnesium, potassium, and phosphorus daily for at least 3 days, MD to replete as needed, as pt is at risk for refeeding syndrome. Recommend initiating TPN at lower rate and slowly advancing to goal.   TPN per pharmacy   Boost Breeze po TID, each supplement provides 250 kcal and 9 grams of protein   NUTRITION DIAGNOSIS:   Moderate Malnutrition related to acute illness (necrotizing pancreatitis) as evidenced by energy intake < 75% for > 7 days, mild muscle depletion, mild fat depletion, percent weight loss, edema.    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 PMH includes CAD s/p stenting 10 years ago, anxiety, depression, EtOH abuse, HTN.  RN in room at time of visit.   Pt reports an inability to tolerate diet since being discharged on 08/06/19, states that he either vomits or has diarrhea every time he eats something. Pt reports only being able to keep down the occasional banana or yogurt since d/c.   Pt advanced to clear liquid diet. Pt agreeable to [employer].  TPN to be initiated today and managed per Pharmacy.   Per wt readings, pt weighed 85.4 kg on 08/01/19. Pt now weighs 69.1 kg. This indicates an 18.9% wt loss x1 month, which is severe and significant for time frame. Pt endorses this wt loss.  Labs: Na 128 (L), Mg 1.2 (L),  Medications: Protonix, IV Mg sulfate 4g  NUTRITION - FOCUSED PHYSICAL  EXAM:    Most Recent Value  Orbital Region Mild depletion  Upper Arm Region Mild depletion  Thoracic and Lumbar Region Mild depletion  Buccal Region Mild depletion  Temple Region Mild depletion  Clavicle Bone Region Moderate depletion  Clavicle and Acromion Bone Region Moderate depletion  Scapular Bone Region Moderate depletion  Dorsal Hand Mild depletion  Patellar Region Mild depletion  Anterior Thigh Region Moderate depletion  Posterior Calf Region Severe depletion  Edema (RD Assessment) Mild  Hair Reviewed  Eyes Reviewed  Mouth Reviewed  Skin Reviewed  Nails Reviewed       Diet Order:   Diet Order            Diet clear liquid Room service appropriate? Yes; Fluid consistency: Thin  Diet effective now                 EDUCATION NEEDS:   No education needs have been identified at this time  Skin:  Skin Assessment: Reviewed RN Assessment  Last BM:  PTA  Height:   Ht Readings from Last 1 Encounters:  08/28/19 5\' 11"  (1.803 m)    Weight:   Wt Readings from Last 5 Encounters:  08/29/19 69.1 kg  08/27/19 64 kg  08/01/19 85.4 kg  12/17/18 76.7 kg  12/13/18 76.8 kg    BMI:  Body mass index is 21.25 kg/m.  Estimated Nutritional Needs:   Kcal:  2200-2400  Protein:  110-125 grams  Fluid:  >/=2.2L/d    14/11/20, MS, RD, LDN RD pager number  and weekend/on-call pager number located in [place].

## 2019-08-29 NOTE — Progress Notes (Signed)
PHARMACY - TOTAL PARENTERAL NUTRITION CONSULT NOTE   Indication: Severe pancreatitis  Patient Measurements: Height: 5\' 11"  (180.3 cm) Weight: 69.1 kg (152 lb 5.4 oz) IBW/kg (Calculated) : 75.3   Body mass index is 21.25 kg/m. Usual Weight:  Current weight: 69 kg  Assessment:  Pt is a 7 yoM with PMH significant for CAD, recent admission for necrotizing pancreatitis, discharged from the hospital on 08/06/19. Pt has had worsening epigastric pain and difficulty tolerating PO. Pharmacy consulted to initiate TPN.   Glucose / Insulin: No history DM. Serum glucose 87 Electrolytes: Na & Mag low - replete 8/27 Renal: wnl, SCr low LFTs / TGs: TG 82 (8/27) Prealbumin / albumin: PreAlb < 5 Intake / Output; MIVF:  GI Imaging: Surgeries / Procedures:   Central access: PICC line ordered TPN start date: 8/27 pending placement of central line  Nutritional Goals: Pending RD recommendations Estimated needs: 100gm protein/day, 1800 kCal/day, goal rate 75 ml/hr  Current Nutrition: NPO  Plan:  Mag Sulfate IVPB 4gm x1 KCl runs 10 mEq qhr x3  Start TPN at 73mL/hr at 1800 Electrolytes in TPN: 60mEq/L of Na, 11mEq/L of K, 52mEq/L of Ca, 7.16mEq/L of Mg, and 75mmol/L of Phos. Cl:Ac ratio 1:1Add standard MVI and trace elements to TPN Consider Initiate SSI based on CBGs  Reduce MIVF to 85 mL/hr at 1800 Monitor TPN labs on Mon/Thurs CMET, Mag, Phos in am 8/28  9/28, PharmD 08/29/2019,10:43 AM

## 2019-08-29 NOTE — Progress Notes (Signed)
Patient  Stated he has a distinct taste in his mouth, like aspirin or multivitamin when he burp or with acid reflux.  Does  not know where it comes from but had it at home as well. He wanted Doctor to know about it.

## 2019-08-29 NOTE — Progress Notes (Signed)
PROGRESS NOTE    Jasim Harari  VOZ:[id number] DOB: 09/15/1970 DOA: 08/27/2019 PCP: [facility]   Brief Narrative:  Patient is a 49 year old male with history of coronary artery disease status post stenting, recently admitted for acute necrotizing pancreatitis and was discharged on 08/06/2019 presented with persistent tachycardia, abdominal pain.  After discharge, patient has not been able to tolerate the food.  He was complaining of worsening pain on the epigastric area, was vomiting and also diarrhea.  On presentation he was found to be febrile.  CT abdomen/pelvis done in the emergency department showed acute on chronic pancreatitis with possible infection/organized necrotic tissue with possible obstruction of the splenic vein.  GI consulted.  Started on IV antibiotics, IV fluids.  Started on TPN.  Assessment & Plan:   Principal Problem:   Pancreatitis, necrotizing Active Problems:   CAD (coronary artery disease), native coronary artery   Benign essential HTN   Pancreatic necrosis   Acute pancreatitis   Acute on chronic pancreatitis: Recently admitted for the management of alcoholic pancreatitis and was discharged on 08/06/2019.  He was following with GI as an outpatient.  CT imaging concerning for 4 walled off necrosis, pancreatic collections, occlusion of the splenic vein..  Started on meropenem, IV fluids.  Currently NPO.  GI consulted.  Lipase is not significantly elevated. He was also febrile on presentation, currently afebrile. Started on  TPN for suspicion of prolonged hospital course with no oral intake . PICC line placed  History of coronary artery disease: Status post stenting, takes aspirin at home.  Hypertension :Currently  blood pressure stable.  Continue as needed medication for hypertension.  History of alcohol abuse: Has not taken any alcohol since last admission.  Continue thiamine, folic acid.  He has hypomagnesemia and  is being supplemented.  Iron  deficiency /normocytic anemia: Hemoglobin dropped to the range of 8.  No history of bleeding, hematochezia, melena. Iron deficiency. Given a dose  of IV iron.  FOBT will be checked.  Eventually he will have EGD and colonoscopy as per GI.  Normal vitamin B12, folic acid.         DVT prophylaxis:heparin [place] Code Status: Full Family Communication: None at bedside Status is: Inpatient  Remains inpatient appropriate because:IV treatments appropriate due to intensity of illness or inability to take PO   Dispo: The patient is from: Home              Anticipated d/c is to: Home              Anticipated d/c date is: > 3 days              Patient currently is not medically stable to d/c.     Consultants: GI  Procedures:None  Antimicrobials:  Anti-infectives (From admission, onward)   Start     Dose/Rate Route Frequency Ordered Stop   08/27/19 2200  meropenem (MERREM) 1 g in sodium chloride 0.9 % 100 mL IVPB        1 g 200 mL/hr over 30 Minutes Intravenous Every 8 hours 08/27/19 2149        Subjective:  Patient seen and examined the bedside this morning.  Still complains of abdomen pain but better than yesterday.  Currently stable.  No nausea or vomiting.  Objective: Vitals:   08/29/19 0046 08/29/19 0352 08/29/19 0457 08/29/19 0500  BP: 133/65 (!) 142/79    Pulse:  (!) 105    Resp: 16 18    Temp: 99  F (37.2 C) 97.9 F (36.6 C) 98.4 F (36.9 C)   TempSrc: Oral Oral Oral   SpO2: 99% 98%    Weight:    69.1 kg  Height:        Intake/Output Summary (Last 24 hours) at 08/29/2019 4944 Last data filed at 08/29/2019 0500 Gross per 24 hour  Intake 568 ml  Output 900 ml  Net -332 ml   Filed Weights   08/28/19 1437 08/29/19 0500  Weight: 69.1 kg 69.1 kg    Examination:  General exam: Not in distress, generalized weakness HEENT:PERRL,Oral mucosa moist, Ear/Nose normal on gross exam Respiratory system: Bilateral equal air entry, normal vesicular breath sounds, no wheezes  or crackles  Cardiovascular system: S1 & S2 heard, RRR. No JVD, murmurs, rubs, gallops or clicks. Gastrointestinal system: Abdomen is nondistended, soft and tender in the epigastric area Central nervous system: Alert and oriented. No focal neurological deficits. Extremities: No edema, no clubbing ,no cyanosis Skin: No rashes, lesions or ulcers,no icterus ,no pallor   Data Reviewed: I have personally reviewed following labs and imaging studies  CBC: Recent Labs  Lab 08/27/19 1546 08/27/19 2239 08/28/19 0417 08/28/19 0937 08/29/19 0556  WBC 10.6* 10.6* 4.5  --  4.4  NEUTROABS  --   --   --   --  3.0  HGB 10.7* 11.0* 7.2* 7.3* 8.0*  HCT 32.0* 33.3* 21.6* 21.9* 23.7*  MCV 90.4 91.2 90.0  --  88.4  PLT 559* 507* 287  --  267   Basic Metabolic Panel: Recent Labs  Lab 08/27/19 1546 08/28/19 0417 08/29/19 0556  NA 128* 130* 128*  K 3.9 4.0 3.5  CL 93* 97* 95*  CO2 24 25 22   GLUCOSE 111* 98 87  BUN 8 9 5*  CREATININE 0.65 0.69 0.53*  CALCIUM 8.3* 7.7* 7.2*  MG  --   --  1.2*  PHOS  --   --  4.0   GFR: Estimated Creatinine Clearance: 106.8 mL/min (A) (by C-G formula based on SCr of 0.53 mg/dL (L)). Liver Function Tests: Recent Labs  Lab 08/27/19 1546 08/28/19 0417 08/29/19 0556  AST 72* 44* 31  ALT 52* 35 31  ALKPHOS 159* 100 95  BILITOT 1.0 1.1 1.2  PROT 8.0 5.9* 5.7*  ALBUMIN 2.2* 1.6* 1.6*   Recent Labs  Lab 08/27/19 1546  LIPASE 27   No results for input(s): AMMONIA in the last 168 hours. Coagulation Profile: Recent Labs  Lab 08/27/19 2000  INR 1.2   Cardiac Enzymes: No results for input(s): CKTOTAL, CKMB, CKMBINDEX, TROPONINI in the last 168 hours. BNP (last 3 results) No results for input(s): PROBNP in the last 8760 hours. HbA1C: No results for input(s): HGBA1C in the last 72 hours. CBG: Recent Labs  Lab 08/28/19 0551 08/28/19 1151 08/28/19 1745 08/28/19 2350 08/29/19 0516  GLUCAP 93 88 78 72 84   Lipid Profile: Recent Labs     08/29/19 0556  TRIG 82   Thyroid Function Tests: No results for input(s): TSH, T4TOTAL, FREET4, T3FREE, THYROIDAB in the last 72 hours. Anemia Panel: Recent Labs    08/28/19 0937  VITAMINB12 470  FOLATE 13.8  FERRITIN 366*  TIBC 100*  IRON 21*   Sepsis Labs: Recent Labs  Lab 08/27/19 2000 08/27/19 2209  LATICACIDVEN 1.4 1.3    Recent Results (from the past 240 hour(s))  SARS Coronavirus 2 by RT PCR (hospital order, performed in [facility] hospital lab) Nasopharyngeal Nasopharyngeal Swab     Status: None  Collection Time: 08/27/19  7:19 PM   Specimen: Nasopharyngeal Swab  Result Value Ref Range Status   SARS Coronavirus 2 NEGATIVE NEGATIVE Final    Comment: (NOTE) SARS-CoV-2 target nucleic acids are NOT DETECTED.  The SARS-CoV-2 RNA is generally detectable in upper and lower respiratory specimens during the acute phase of infection. The lowest concentration of SARS-CoV-2 viral copies this assay can detect is 250 copies / mL. A negative result does not preclude SARS-CoV-2 infection and should not be used as the sole basis for treatment or other patient management decisions.  A negative result may occur with improper specimen collection / handling, submission of specimen other than nasopharyngeal swab, presence of viral mutation(s) within the areas targeted by this assay, and inadequate number of viral copies (<250 copies / mL). A negative result must be combined with clinical observations, patient history, and epidemiological information.  Fact Sheet for Patients:   BoilerBrush.com.cyhttps://www.fda.gov/media/136312/download  Fact Sheet for Healthcare Providers: https://pope.com/https://www.fda.gov/media/136313/download  This test is not yet approved or  cleared by the [place] FDA and has been authorized for detection and/or diagnosis of SARS-CoV-2 by FDA under an Emergency Use Authorization (EUA).  This EUA will remain in effect (meaning this test can be used) for the duration of  the COVID-19 declaration under Section 564(b)(1) of the Act, 21 U.S.C. section 360bbb-3(b)(1), unless the authorization is terminated or revoked sooner.  Performed at [facility], [address] [place], [place] [address]   Blood culture (routine single)     Status: None (Preliminary result)   Collection Time: 08/27/19  8:00 PM   Specimen: BLOOD  Result Value Ref Range Status   Specimen Description   Final    BLOOD RIGHT ANTECUBITAL Performed at [facility], [address] [place], [place] [address]    Special Requests   Final    BOTTLES DRAWN AEROBIC AND ANAEROBIC Blood Culture adequate volume Performed at [facility], [address] [place], [place] [address]    Culture   Final    NO GROWTH 2 DAYS Performed at [facility], [address] [place], [place] [address]    Report Status PENDING  Incomplete  Culture, blood (single)     Status: None (Preliminary result)   Collection Time: 08/27/19 10:09 PM   Specimen: BLOOD  Result Value Ref Range Status   Specimen Description   Final    BLOOD RIGHT ANTECUBITAL Performed at [facility], [address] [place], [place] [address]    Special Requests   Final    BOTTLES DRAWN AEROBIC AND ANAEROBIC Blood Culture results may not be optimal due to an inadequate volume of blood received in culture bottles Performed at [facility], [address] [place], [place] [address]    Culture   Final    NO GROWTH 1 DAY Performed at [facility], [address] [place], [place] [address]    Report Status PENDING  Incomplete         Radiology Studies: CT ABDOMEN PELVIS W CONTRAST  Result Date: 08/27/2019 CLINICAL DATA:  History of necrotic pancreatitis diagnosed in July, presents with abdominal pain. EXAM: CT ABDOMEN AND PELVIS WITH CONTRAST TECHNIQUE: Multidetector CT imaging of the abdomen and pelvis was performed using  the standard protocol following bolus administration of intravenous contrast. CONTRAST:  100mL OMNIPAQUE IOHEXOL 300 MG/ML  SOLN COMPARISON:  Multiple prior studies, most recent comparison from July 31, 2019 FINDINGS: Lower chest:  Lung bases are clear. No consolidation. No pleural effusion. Hepatobiliary: Marked hepatic steatosis. Main portal vein remains patent, see below. Biliary duct distension with transition in the pancreatic head, this is developed since the previous exam. Stranding generalize in the upper abdomen some of which is adjacent to the gallbladder. This is of uncertain significance Pancreas: Occlusion of splenic vein. Narrowing of the main portal vein, further narrowing of the peripheral main portal vein since the prior study. (Image 28, series 2) collateral formation with numerous gastroepiploic collaterals and gastric varices which have developed since the previous exam. 3.1 x 1.9 cm collection in the anterior pancreas does contain a small amount of gas compatible with walled-off necrosis involving the pancreatic head in the pancreatic-duodenal groove. Further necrosis of the gland. Walled off necrosis in the tail of the pancreas measuring 5.7 x 5.0 cm (image 27, series 2) previously 6.7 x 7.2 cm. This extends into the root of the mesentery and the anterior pararenal space in the LEFT hemiabdomen. Abundant stranding about the pancreas and duodenum. SMV is patent as described above. Spleen: Spleen is enlarged. Small amount of perisplenic fluid and fluid in the gastrohepatic recess. Wall up necrosis extends into the splenic hilum. Adrenals/Urinary Tract: Adrenal glands are normal. Symmetric renal enhancement. No hydronephrosis. Urinary bladder is normal. Stomach/Bowel: Stomach with gastric varices. Peri duodenal stranding and stranding throughout the root of the small bowel mesentery No sign of bowel obstruction. Colonic thickening at the hepatic flexure and splenic flexure similar at the a hepatic  flexure but improved with respect to splenic flexure thickening when compared to the previous study. Vascular/Lymphatic: Calcified and noncalcified atheromatous plaque of the abdominal aorta. No adenopathy in the retroperitoneum. No adenopathy in the pelvis. Reproductive: Prostate unremarkable by CT. Other: Small volume ascites in the pelvis. Diminished when compared to the prior study. Musculoskeletal: No acute musculoskeletal process. No destructive bone finding. IMPRESSION: 1. Walled-off necrosis with further organization of necrotic pancreatic collections about the pancreas tracking into splenic hilum and showing further necrosis of the gland at greater than 50% necrosis. Findings suspicious for acute pancreatitis superimposed on sequela of previous pancreatitis discussed above. 2. Small locules of gas in the pancreatic head within areas of walled-off necrosis suspicious for early infection. 3. Occlusion of the splenic vein. Narrowing of the main portal vein, further narrowing of the peripheral main portal vein since the prior study. This results in interval development of upper abdominal venous collaterals and gastric varices. 4. Biliary duct distension approximately 9 mm of the common bile duct has developed since the prior study. The gallbladder is less distended than on the prior exam. No overt signs of acute cholecystitis though there is generalized inflammation in the upper abdomen. Close attention on follow-up. 5. Colonic thickening at the hepatic flexure and splenic flexure similar at the a hepatic flexure but improved with respect to splenic flexure thickening when compared to the previous study. 6. Small volume ascites in the pelvis. Diminished when compared to the prior study. 7. Marked hepatic steatosis. 8. Aortic atherosclerosis. Aortic Atherosclerosis (ICD10-I70.0). Electronically Signed   By: Donzetta Kohut M.D.   On: 08/27/2019 21:24   DG Chest Port 1 View  Result Date: 08/27/2019 CLINICAL  DATA:  Possible sepsis EXAM: PORTABLE CHEST 1 VIEW COMPARISON:  08/01/2019 FINDINGS: The heart size and mediastinal contours are within normal limits. Both lungs are clear. The visualized skeletal structures are unremarkable. IMPRESSION: Negative. Electronically Signed   By: Charlett Nose M.D.   On: 08/27/2019 19:35  [place] EKG SITE RITE  Result Date: 08/28/2019 If [facility] image not attached, placement could not be confirmed due to current cardiac rhythm.       Scheduled Meds: . Chlorhexidine Gluconate Cloth  6 each Topical Daily  . pantoprazole (PROTONIX) IV  40 mg Intravenous Q12H  . sodium chloride flush  10-40 mL Intracatheter Q12H   Continuous Infusions: . magnesium sulfate bolus IVPB 4 g (08/29/19 0729)  . meropenem (MERREM) IV 1 g (08/29/19 0512)     LOS: 2 days    Time spent: 35 mins,More than 50% of that time was spent in counseling and/or coordination of care.      Burnadette Pop, MD [facility] P8/27/2021, 8:22 AM

## 2019-08-29 NOTE — Progress Notes (Addendum)
[facility] Progress Note  CC:  Necrotizing EtOH pancreatitis   Subjective: His energy level has improved after receiving 1 unit of PRBCs yesterday evening. No N/V. His upper abdominal pain is currently well controlled. No BM. PICC line place.    Objective:  Vital signs in last 24 hours: Temp:  [97.9 F (36.6 C)-99.7 F (37.6 C)] 98.4 F (36.9 C) (08/27 0457) Pulse Rate:  [97-106] 105 (08/27 0352) Resp:  [16-26] 18 (08/27 0352) BP: (122-144)/(65-83) 142/79 (08/27 0352) SpO2:  [97 %-100 %] 98 % (08/27 0352) Weight:  [69.1 kg] 69.1 kg (08/27 0500) Last BM Date:  (Pt unsure)   General:  Alert 49 year old male in NAD. Heart: RRR, no murmur.  Pulm: Breath sounds clear throughout.  Abdomen: Upper abdomen mildly firm. Generalized tenderness without rebound or guarding. No mass or HSM. + BX x 4 quads.  Extremities:  Without edema. Neurologic:  Alert and  oriented x4;  grossly normal neurologically. Psych:  Alert and cooperative. Normal mood and affect.  Intake/Output from previous day: 08/26 0701 - 08/27 0700 In: 568 [P.O.:120; Blood:348; IV Piggyback:100] Out: 900 [Urine:900] Intake/Output this shift: No intake/output data recorded.  Lab Results: Recent Labs    08/27/19 2239 08/27/19 2239 08/28/19 0417 08/28/19 0937 08/29/19 0556  WBC 10.6*  --  4.5  --  4.4  HGB 11.0*   < > 7.2* 7.3* 8.0*  HCT 33.3*   < > 21.6* 21.9* 23.7*  PLT 507*  --  287  --  267   < > = values in this interval not displayed.   BMET Recent Labs    08/27/19 1546 08/28/19 0417 08/29/19 0556  NA 128* 130* 128*  K 3.9 4.0 3.5  CL 93* 97* 95*  CO2 24 25 22   GLUCOSE 111* 98 87  BUN 8 9 5*  CREATININE 0.65 0.69 0.53*  CALCIUM 8.3* 7.7* 7.2*   LFT Recent Labs    08/28/19 0417 08/28/19 0417 08/29/19 0556  PROT 5.9*   < > 5.7*  ALBUMIN 1.6*   < > 1.6*  AST 44*   < > 31  ALT 35   < > 31  ALKPHOS 100   < > 95  BILITOT 1.1   < > 1.2  BILIDIR 0.3*  --   --   IBILI 0.8   --   --    < > = values in this interval not displayed.   PT/INR Recent Labs    08/27/19 2000  LABPROT 15.1  INR 1.2   Hepatitis Panel No results for input(s): HEPBSAG, HCVAB, HEPAIGM, HEPBIGM in the last 72 hours.  CT ABDOMEN PELVIS W CONTRAST  Result Date: 08/27/2019 CLINICAL DATA:  History of necrotic pancreatitis diagnosed in July, presents with abdominal pain. EXAM: CT ABDOMEN AND PELVIS WITH CONTRAST TECHNIQUE: Multidetector CT imaging of the abdomen and pelvis was performed using the standard protocol following bolus administration of intravenous contrast. CONTRAST:  August OMNIPAQUE IOHEXOL 300 MG/ML  SOLN COMPARISON:  Multiple prior studies, most recent comparison from July 31, 2019 FINDINGS: Lower chest: Lung bases are clear. No consolidation. No pleural effusion. Hepatobiliary: Marked hepatic steatosis. Main portal vein remains patent, see below. Biliary duct distension with transition in the pancreatic head, this is developed since the previous exam. Stranding generalize in the upper abdomen some of which is adjacent to the gallbladder. This is of uncertain significance Pancreas: Occlusion of splenic vein. Narrowing of the main portal vein, further narrowing of the  peripheral main portal vein since the prior study. (Image 28, series 2) collateral formation with numerous gastroepiploic collaterals and gastric varices which have developed since the previous exam. 3.1 x 1.9 cm collection in the anterior pancreas does contain a small amount of gas compatible with walled-off necrosis involving the pancreatic head in the pancreatic-duodenal groove. Further necrosis of the gland. Walled off necrosis in the tail of the pancreas measuring 5.7 x 5.0 cm (image 27, series 2) previously 6.7 x 7.2 cm. This extends into the root of the mesentery and the anterior pararenal space in the LEFT hemiabdomen. Abundant stranding about the pancreas and duodenum. SMV is patent as described above. Spleen: Spleen is  enlarged. Small amount of perisplenic fluid and fluid in the gastrohepatic recess. Wall up necrosis extends into the splenic hilum. Adrenals/Urinary Tract: Adrenal glands are normal. Symmetric renal enhancement. No hydronephrosis. Urinary bladder is normal. Stomach/Bowel: Stomach with gastric varices. Peri duodenal stranding and stranding throughout the root of the small bowel mesentery No sign of bowel obstruction. Colonic thickening at the hepatic flexure and splenic flexure similar at the a hepatic flexure but improved with respect to splenic flexure thickening when compared to the previous study. Vascular/Lymphatic: Calcified and noncalcified atheromatous plaque of the abdominal aorta. No adenopathy in the retroperitoneum. No adenopathy in the pelvis. Reproductive: Prostate unremarkable by CT. Other: Small volume ascites in the pelvis. Diminished when compared to the prior study. Musculoskeletal: No acute musculoskeletal process. No destructive bone finding. IMPRESSION: 1. Walled-off necrosis with further organization of necrotic pancreatic collections about the pancreas tracking into splenic hilum and showing further necrosis of the gland at greater than 50% necrosis. Findings suspicious for acute pancreatitis superimposed on sequela of previous pancreatitis discussed above. 2. Small locules of gas in the pancreatic head within areas of walled-off necrosis suspicious for early infection. 3. Occlusion of the splenic vein. Narrowing of the main portal vein, further narrowing of the peripheral main portal vein since the prior study. This results in interval development of upper abdominal venous collaterals and gastric varices. 4. Biliary duct distension approximately 9 mm of the common bile duct has developed since the prior study. The gallbladder is less distended than on the prior exam. No overt signs of acute cholecystitis though there is generalized inflammation in the upper abdomen. Close attention on  follow-up. 5. Colonic thickening at the hepatic flexure and splenic flexure similar at the a hepatic flexure but improved with respect to splenic flexure thickening when compared to the previous study. 6. Small volume ascites in the pelvis. Diminished when compared to the prior study. 7. Marked hepatic steatosis. 8. Aortic atherosclerosis. Aortic Atherosclerosis (ICD10-I70.0). Electronically Signed   By: Donzetta Kohut M.D.   On: 08/27/2019 21:24   DG Chest Port 1 View  Result Date: 08/27/2019 CLINICAL DATA:  Possible sepsis EXAM: PORTABLE CHEST 1 VIEW COMPARISON:  08/01/2019 FINDINGS: The heart size and mediastinal contours are within normal limits. Both lungs are clear. The visualized skeletal structures are unremarkable. IMPRESSION: Negative. Electronically Signed   By: Charlett Nose M.D.   On: 08/27/2019 19:35   [place] EKG SITE RITE  Result Date: 08/28/2019 If Site Rite image not attached, placement could not be confirmed due to current cardiac rhythm.   Assessment / Plan:  91.  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 CTAP showed persistent walled off pancreatic necrosis > 50% of the pancreas with a small locules of gas in the pancreatic head concerning for infectious process.  WBC 10.5 ->  4.4. Temp 102.20F on 8/25. Afebrile today. Normal LFTs, Lipase and Triglyceride levels. Hemodynamically stable. PICC Line placed 8/26, to start TPN today.  -NPO with ice chip -IV fluids per the hospitalist -Agree with Meropenem 1 g IV every 8 hours -Pain management per the  hospitalist -Repeat CBC and CMP in a.m.  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 Heparin SQ discontinued to increased risk of GI bleed with gastric varices.   3.  Normocytic anemia.  Hemoglobin 7.2 (Hg 11.0 on 8/25).  MCV 90.0. Iron 21. History of black melenic stools x 2 to 3 weeks. No BM since admission. No stool on rectal exam  8/26. FOBT next BM ordered. Transfused 1 unit of PRBCs 8/26. Post transfusion Hg 8.0. Received IV Feraheme this am.  -Continue Pantoprazole 40mg  IV bid -Repeat CBC in AM, check stat H/H if he develops any overt GI bleeding  -Transfuse for Hg < 7 -FOBT -Eventual EGD and colonoscopy  4. Hyponatremia. Na 128.  -Management per the hospitalist   Further recommendations per Dr.   Principal Problem:   Pancreatitis, necrotizing Active Problems:   CAD (coronary artery disease), native coronary artery   Benign essential HTN   Pancreatic necrosis   Acute pancreatitis     LOS: 2 days   Chales Abrahams  08/29/2019, 11:01 AM    Attending physician's note   I have taken an interval history, reviewed the chart and examined the patient. I agree with the Advanced Practitioner's note, impression and recommendations.   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 ?  Infected Anemia with H/O melena.  No active GI bleeding. S/P 1U PRBC yesterday. 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 Malnutrition  Feels better this morning No further melena.  No abdo pain. Would like to try liquid diet.  Has passed flatus. No BMs.   Plan: -Trial of clear liquid diet. -Continue meropenem. -Continue IV Protonix. -Start TPN per pharmacy. PICC placed yesterday. -Ambulate -Expect protracted and long clinical course. -Will discuss with Dr.Danis.  Conservative management for now.  If fails to improve, may require FNA/necrosectomy.   08/31/2019, MD [facility]

## 2019-08-30 DIAGNOSIS — R7989 Other specified abnormal findings of blood chemistry: Secondary | ICD-10-CM

## 2019-08-30 DIAGNOSIS — R1010 Upper abdominal pain, unspecified: Secondary | ICD-10-CM

## 2019-08-30 LAB — TYPE AND SCREEN
ABO/RH(D): O POS
Antibody Screen: NEGATIVE
Unit division: 0

## 2019-08-30 LAB — COMPREHENSIVE METABOLIC PANEL
ALT: 48 U/L — ABNORMAL HIGH (ref 0–44)
AST: 64 U/L — ABNORMAL HIGH (ref 15–41)
Albumin: 1.5 g/dL — ABNORMAL LOW (ref 3.5–5.0)
Alkaline Phosphatase: 190 U/L — ABNORMAL HIGH (ref 38–126)
Anion gap: 9 (ref 5–15)
BUN: <5 mg/dL — ABNORMAL LOW (ref 6–20)
CO2: 26 mmol/L (ref 22–32)
Calcium: 7.3 mg/dL — ABNORMAL LOW (ref 8.9–10.3)
Chloride: 94 mmol/L — ABNORMAL LOW (ref 98–111)
Creatinine, Ser: <0.3 mg/dL — ABNORMAL LOW (ref 0.61–1.24)
Glucose, Bld: 125 mg/dL — ABNORMAL HIGH (ref 70–99)
Potassium: 3.5 mmol/L (ref 3.5–5.1)
Sodium: 129 mmol/L — ABNORMAL LOW (ref 135–145)
Total Bilirubin: 0.9 mg/dL (ref 0.3–1.2)
Total Protein: 5.4 g/dL — ABNORMAL LOW (ref 6.5–8.1)

## 2019-08-30 LAB — GLUCOSE, CAPILLARY
Glucose-Capillary: 126 mg/dL — ABNORMAL HIGH (ref 70–99)
Glucose-Capillary: 113 mg/dL — ABNORMAL HIGH (ref 70–99)
Glucose-Capillary: 112 mg/dL — ABNORMAL HIGH (ref 70–99)
Glucose-Capillary: 114 mg/dL — ABNORMAL HIGH (ref 70–99)

## 2019-08-30 LAB — BPAM RBC
Blood Product Expiration Date: 202109282359
ISSUE DATE / TIME: 202108270023
Unit Type and Rh: 5100

## 2019-08-30 LAB — PHOSPHORUS: Phosphorus: 3.1 mg/dL (ref 2.5–4.6)

## 2019-08-30 LAB — MAGNESIUM: Magnesium: 1.6 mg/dL — ABNORMAL LOW (ref 1.7–2.4)

## 2019-08-30 LAB — OCCULT BLOOD X 1 CARD TO LAB, STOOL: Fecal Occult Bld: NEGATIVE

## 2019-08-30 MED ORDER — INSULIN ASPART 100 UNIT/ML ~~LOC~~ SOLN
0.0000 [IU] | Freq: Four times a day (QID) | SUBCUTANEOUS | Status: DC
  Administered 2019-09-02 – 2019-09-03 (×3): 1 [IU] via SUBCUTANEOUS

## 2019-08-30 MED ORDER — HYDROMORPHONE HCL 1 MG/ML IJ SOLN
0.5000 mg | INTRAMUSCULAR | Status: DC | PRN
  Administered 2019-08-30 – 2019-08-31 (×5): 0.5 mg via INTRAVENOUS
  Filled 2019-08-30 (×5): qty 0.5

## 2019-08-30 MED ORDER — TRAVASOL 10 % IV SOLN
INTRAVENOUS | Status: AC
  Filled 2019-08-30: qty 835.2

## 2019-08-30 MED ORDER — BUSPIRONE HCL 5 MG PO TABS
30.0000 mg | ORAL_TABLET | Freq: Every day | ORAL | Status: DC
  Administered 2019-08-30 – 2019-09-10 (×12): 30 mg via ORAL
  Filled 2019-08-30 (×12): qty 6

## 2019-08-30 MED ORDER — POTASSIUM CHLORIDE 10 MEQ/100ML IV SOLN
10.0000 meq | INTRAVENOUS | Status: AC
  Administered 2019-08-30 (×4): 10 meq via INTRAVENOUS
  Filled 2019-08-30 (×4): qty 100

## 2019-08-30 MED ORDER — QUETIAPINE FUMARATE 25 MG PO TABS
50.0000 mg | ORAL_TABLET | Freq: Every day | ORAL | Status: DC
  Administered 2019-08-30 – 2019-09-01 (×3): 50 mg via ORAL
  Filled 2019-08-30 (×4): qty 2

## 2019-08-30 MED ORDER — MAGNESIUM SULFATE 2 GM/50ML IV SOLN
2.0000 g | Freq: Once | INTRAVENOUS | Status: AC
  Administered 2019-08-30: 2 g via INTRAVENOUS
  Filled 2019-08-30: qty 50

## 2019-08-30 NOTE — Progress Notes (Addendum)
Daily Rounding Note  08/30/2019, 10:13 AM  LOS: 3 days   SUBJECTIVE:   Chief complaint: severe ETOH pancreatitis     Used 12 mg Morphine yest, 6 mg so far today.  Would like increased doses of morphine Funny taste in mouth from regurge, subjective foul breath.  No much nausea Intermittent wheezing and brief SBO  Upper abdominal pain diffuse Intermittent nausea Denies chest pain, dyspnea or dysuria OBJECTIVE:         Vital signs in last 24 hours:    Temp:  [98.3 F (36.8 C)-99.9 F (37.7 C)] 99.9 F (37.7 C) (08/28 0451) Pulse Rate:  [87-92] 87 (08/28 0451) Resp:  [16-19] 19 (08/28 0451) BP: (115-143)/(81-86) 138/83 (08/28 0451) SpO2:  [97 %-99 %] 98 % (08/28 0451) Weight:  [72.3 kg] 72.3 kg (08/28 0500) Last BM Date: 08/27/19 Filed Weights   08/28/19 1437 08/29/19 0500 08/30/19 0500  Weight: 69.1 kg 69.1 kg 72.3 kg   General: pale, looks chronically ill and fatigued Heart: RRR Chest: clear bil.  No cough, no dyspnea.  Low inspiratory volumes, hurts to take a deep breath Abdomen: soft, diffuse upper abdominal tenderness as before, active BS, nondistended Extremities: no CCE Neuro/Psych:  Alert, oriented x 3.    Intake/Output from previous day: 08/27 0701 - 08/28 0700 In: 1893.1 [P.O.:100; I.V.:1207.8; IV Piggyback:585.3] Out: 1200 [Urine:1200]  Intake/Output this shift: Total I/O In: -  Out: 450 [Urine:250; Stool:200]  Lab Results: Recent Labs    08/27/19 2239 08/27/19 2239 08/28/19 0417 08/28/19 0937 08/29/19 0556  WBC 10.6*  --  4.5  --  4.4  HGB 11.0*   < > 7.2* 7.3* 8.0*  HCT 33.3*   < > 21.6* 21.9* 23.7*  PLT 507*  --  287  --  267   < > = values in this interval not displayed.   BMET Recent Labs    08/28/19 0417 08/29/19 0556 08/30/19 0338  NA 130* 128* 129*  K 4.0 3.5 3.5  CL 97* 95* 94*  CO2 25 22 26   GLUCOSE 98 87 125*  BUN 9 5* <5*  CREATININE 0.69 0.53* <0.30*  CALCIUM  7.7* 7.2* 7.3*   LFT Recent Labs    08/28/19 0417 08/29/19 0556 08/30/19 0338  PROT 5.9* 5.7* 5.4*  ALBUMIN 1.6* 1.6* 1.5*  AST 44* 31 64*  ALT 35 31 48*  ALKPHOS 100 95 190*  BILITOT 1.1 1.2 0.9  BILIDIR 0.3*  --   --   IBILI 0.8  --   --    PT/INR Recent Labs    08/27/19 2000  LABPROT 15.1  INR 1.2   Hepatitis Panel No results for input(s): HEPBSAG, HCVAB, HEPAIGM, HEPBIGM in the last 72 hours.  Studies/Results: [place] EKG SITE RITE  Result Date: 08/28/2019 If Site Rite image not attached, placement could not be confirmed due to current cardiac rhythm.   Scheduled Meds: . busPIRone  30 mg Oral QHS  . Chlorhexidine Gluconate Cloth  6 each Topical Daily  . feeding supplement  1 Container Oral TID BM  . insulin aspart  0-9 Units Subcutaneous Q6H  . pantoprazole (PROTONIX) IV  40 mg Intravenous Q12H  . QUEtiapine  50 mg Oral QHS  . sodium chloride flush  10-40 mL Intracatheter Q12H   Continuous Infusions: . sodium chloride 100 mL/hr (08/29/19 1939)  . meropenem (MERREM) IV 1 g (08/30/19 0556)  . potassium chloride    . TPN ADULT (ION)  40 mL/hr at 08/29/19 1713  . 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  Walled off necrosis. Causing CBD dilation to 24m.    D 4 Meropenem. WBCs 10.6 >> 4.4.  *   Anemia.  Normal MCV.  Low iron, low TIBC.  Folate, b12, ferritin ok.   Hgb 7.2 >> PRBC x 1 >> 9.   Feraheme 8/27  *   Hyponatremia  *  Prot cal malnutrition.  Pre-alb <5.  Wt loss. TPN in place.    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    *   Fatty liver due to ETOH.  Alk phos >> transaminase elevation, t bili wnl.       PLAN   *  Stay course with current mgt, anticipate this will be a prolonged admission.   Defer  decisions for pain to Dr ATawanna Solo    SAzucena Freed 08/30/2019, 10:13 AM Phone [phone]  I have discussed the case with the PA, and that is the plan I formulated. I personally interviewed and examined the patient.  He remains about the same clinically, and we expect this will be a protracted course of bowel rest with parenteral nutrition and antibiotics for infected pancreatic necrosis.  His WBC is decreasing.  I am concerned about the imaging findings of mild biliary ductal dilatation.  His alkaline phosphatase is gone up from 95-1 90 in just a day, though I wonder if that could somehow be related to TPN, as it would be a rather abrupt increase from a worsening biliary obstruction, and the bilirubin remains normal.  That will need to be monitored daily.  I will message the hospitalist and see if they might consider a PCA for this patient.  Total time 25 minutes.  HNelida MeuseIII Office: [phone]

## 2019-08-30 NOTE — Progress Notes (Addendum)
PROGRESS NOTE    Gary Atkinson  RCV:[id number] DOB: April 07, 1970 DOA: 08/27/2019 PCP: [facility]   Brief Narrative:  Patient is a 49 year old male with history of coronary artery disease status post stenting, recently admitted for acute necrotizing pancreatitis and was discharged on 08/06/2019 presented with persistent tachycardia, abdominal pain.  After discharge, patient has not been able to tolerate the food.  He was complaining of worsening pain on the epigastric area, was vomiting and also diarrhea.  On presentation he was found to be febrile.  CT abdomen/pelvis done in the emergency department showed acute on chronic pancreatitis with possible infection/organized necrotic tissue with possible obstruction of the splenic vein.  GI consulted.  Started on IV antibiotics, IV fluids.  Started on TPN.  Assessment & Plan:   Principal Problem:   Pancreatitis, necrotizing Active Problems:   CAD (coronary artery disease), native coronary artery   Benign essential HTN   Pancreatic necrosis   Acute pancreatitis   Malnutrition of moderate degree   Acute on chronic pancreatitis: Recently admitted for the management of alcoholic pancreatitis and was discharged on 08/06/2019.  He was following with GI as an outpatient.  CT imaging concerning for 4 walled off necrosis, pancreatic collections, occlusion of the splenic vein..  Started on meropenem, IV fluids.  GI consulted.  Lipase is not significantly elevated. He was also febrile on presentation, currently afebrile. Started on  TPN for suspicion of prolonged hospital course with no oral intake . PICC line placed Started on clear liquid diet on 08/29/2019.  History of coronary artery disease: Status post stenting, takes aspirin at home.  Hypertension :Currently  blood pressure stable.  Continue as needed medication for hypertension.  History of alcohol abuse: Has not taken any alcohol since last admission.  Continue thiamine, folic  acid.  He has hypomagnesemia and  is being supplemented.  Iron deficiency /normocytic anemia: Hemoglobin dropped to the range of 8.  No history of bleeding, hematochezia, melena. Iron deficiency. Given a dose  of IV iron.  FOBT is negative.  On Protonix twice daily.  Eventually he will have EGD and colonoscopy as per GI.  Normal vitamin B12, folic acid.  Hypomagnesemia: Being supplemented with magnesium.  Hyponatremia: Mild.  Continue to monitor.  Severe protein calorie malnutrition: On TPN.  Albumin of 1.5.  Nutrition Problem: Moderate Malnutrition Etiology: acute illness (necrotizing pancreatitis)      DVT prophylaxis:heparin [place] Code Status: Full Family Communication: None at bedside Status is: Inpatient  Remains inpatient appropriate because:IV treatments appropriate due to intensity of illness or inability to take PO   Dispo: The patient is from: Home              Anticipated d/c is to: Home              Anticipated d/c date is: > 3 days              Patient currently is not medically stable to d/c.     Consultants: GI  Procedures:None  Antimicrobials:  Anti-infectives (From admission, onward)   Start     Dose/Rate Route Frequency Ordered Stop   08/27/19 2200  meropenem (MERREM) 1 g in sodium chloride 0.9 % 100 mL IVPB        1 g 200 mL/hr over 30 Minutes Intravenous Every 8 hours 08/27/19 2149        Subjective:  Patient seen and examined the bedside this morning.  Appears much better today.  More comfortable.  Abdomen pain has significantly improved, no nausea or vomiting   Objective: Vitals:   08/29/19 1524 08/29/19 2118 08/30/19 0451 08/30/19 0500  BP: 115/81 (!) 143/86 138/83   Pulse: 92 92 87   Resp: 16 19 19    Temp: 98.3 F (36.8 C) 99.5 F (37.5 C) 99.9 F (37.7 C)   TempSrc: Oral Oral Oral   SpO2: 97% 99% 98%   Weight:    72.3 kg  Height:        Intake/Output Summary (Last 24 hours) at 08/30/2019 0742 Last data filed at 08/30/2019  0556 Gross per 24 hour  Intake 1893.07 ml  Output 1200 ml  Net 693.07 ml   Filed Weights   08/28/19 1437 08/29/19 0500 08/30/19 0500  Weight: 69.1 kg 69.1 kg 72.3 kg    Examination:   General exam: Comfortable HEENT:PERRL,Oral mucosa moist, Ear/Nose normal on gross exam Respiratory system: Bilateral equal air entry, normal vesicular breath sounds, no wheezes or crackles  Cardiovascular system: S1 & S2 heard, RRR. No JVD, murmurs, rubs, gallops or clicks. Gastrointestinal system: Abdomen is nondistended, soft and tender in the epigastrium. No organomegaly or masses felt. Normal bowel sounds heard. Central nervous system: Alert and oriented. No focal neurological deficits. Extremities: No edema, no clubbing ,no cyanosis, distal peripheral pulses palpable. Skin: No rashes, lesions or ulcers,no icterus ,no pallor    Data Reviewed: I have personally reviewed following labs and imaging studies  CBC: Recent Labs  Lab 08/27/19 1546 08/27/19 2239 08/28/19 0417 08/28/19 0937 08/29/19 0556  WBC 10.6* 10.6* 4.5  --  4.4  NEUTROABS  --   --   --   --  3.0  HGB 10.7* 11.0* 7.2* 7.3* 8.0*  HCT 32.0* 33.3* 21.6* 21.9* 23.7*  MCV 90.4 91.2 90.0  --  88.4  PLT 559* 507* 287  --  267   Basic Metabolic Panel: Recent Labs  Lab 08/27/19 1546 08/28/19 0417 08/29/19 0556 08/30/19 0338  NA 128* 130* 128* 129*  K 3.9 4.0 3.5 3.5  CL 93* 97* 95* 94*  CO2 24 25 22 26   GLUCOSE 111* 98 87 125*  BUN 8 9 5* <5*  CREATININE 0.65 0.69 0.53* <0.30*  CALCIUM 8.3* 7.7* 7.2* 7.3*  MG  --   --  1.2* 1.6*  PHOS  --   --  4.0 3.1   GFR: CrCl cannot be calculated (This lab value cannot be used to calculate CrCl because it is not a number: <0.30). Liver Function Tests: Recent Labs  Lab 08/27/19 1546 08/28/19 0417 08/29/19 0556 08/30/19 0338  AST 72* 44* 31 64*  ALT 52* 35 31 48*  ALKPHOS 159* 100 95 190*  BILITOT 1.0 1.1 1.2 0.9  PROT 8.0 5.9* 5.7* 5.4*  ALBUMIN 2.2* 1.6* 1.6* 1.5*    Recent Labs  Lab 08/27/19 1546  LIPASE 27   No results for input(s): AMMONIA in the last 168 hours. Coagulation Profile: Recent Labs  Lab 08/27/19 2000  INR 1.2   Cardiac Enzymes: No results for input(s): CKTOTAL, CKMB, CKMBINDEX, TROPONINI in the last 168 hours. BNP (last 3 results) No results for input(s): PROBNP in the last 8760 hours. HbA1C: No results for input(s): HGBA1C in the last 72 hours. CBG: Recent Labs  Lab 08/29/19 0516 08/29/19 1142 08/29/19 1739 08/29/19 2349 08/30/19 0538  GLUCAP 84 88 133* 140* 126*   Lipid Profile: Recent Labs    08/29/19 0556  TRIG 82   Thyroid Function Tests: No results for input(s): TSH,  T4TOTAL, FREET4, T3FREE, THYROIDAB in the last 72 hours. Anemia Panel: Recent Labs    08/28/19 0937  VITAMINB12 470  FOLATE 13.8  FERRITIN 366*  TIBC 100*  IRON 21*   Sepsis Labs: Recent Labs  Lab 08/27/19 2000 08/27/19 2209  LATICACIDVEN 1.4 1.3    Recent Results (from the past 240 hour(s))  SARS Coronavirus 2 by RT PCR (hospital order, performed in [facility] hospital lab) Nasopharyngeal Nasopharyngeal Swab     Status: None   Collection Time: 08/27/19  7:19 PM   Specimen: Nasopharyngeal Swab  Result Value Ref Range Status   SARS Coronavirus 2 NEGATIVE NEGATIVE Final    Comment: (NOTE) SARS-CoV-2 target nucleic acids are NOT DETECTED.  The SARS-CoV-2 RNA is generally detectable in upper and lower respiratory specimens during the acute phase of infection. The lowest concentration of SARS-CoV-2 viral copies this assay can detect is 250 copies / mL. A negative result does not preclude SARS-CoV-2 infection and should not be used as the sole basis for treatment or other patient management decisions.  A negative result may occur with improper specimen collection / handling, submission of specimen other than nasopharyngeal swab, presence of viral mutation(s) within the areas targeted by this assay, and inadequate number of  viral copies (<250 copies / mL). A negative result must be combined with clinical observations, patient history, and epidemiological information.  Fact Sheet for Patients:   BoilerBrush.com.cy  Fact Sheet for Healthcare Providers: https://pope.com/  This test is not yet approved or  cleared by the [place] FDA and has been authorized for detection and/or diagnosis of SARS-CoV-2 by FDA under an Emergency Use Authorization (EUA).  This EUA will remain in effect (meaning this test can be used) for the duration of the COVID-19 declaration under Section 564(b)(1) of the Act, 21 U.S.C. section 360bbb-3(b)(1), unless the authorization is terminated or revoked sooner.  Performed at [facility], [address] [place], [place] [address]   Blood culture (routine single)     Status: None (Preliminary result)   Collection Time: 08/27/19  8:00 PM   Specimen: BLOOD  Result Value Ref Range Status   Specimen Description   Final    BLOOD RIGHT ANTECUBITAL Performed at [facility], [address], [place], [place] [address]    Special Requests   Final    BOTTLES DRAWN AEROBIC AND ANAEROBIC Blood Culture adequate volume Performed at [facility], [address] [place], [place] [address]    Culture   Final    NO GROWTH 2 DAYS Performed at [facility], [address] [place], [place] [address]    Report Status PENDING  Incomplete  Culture, blood (single)     Status: None (Preliminary result)   Collection Time: 08/27/19 10:09 PM   Specimen: BLOOD  Result Value Ref Range Status   Specimen Description   Final    BLOOD RIGHT ANTECUBITAL Performed at [facility], [address] [place], [place] [address]    Special Requests   Final    BOTTLES DRAWN AEROBIC AND ANAEROBIC Blood Culture results may not be optimal due to an inadequate volume of blood received  in culture bottles Performed at [facility], [address], [place], [place] [address]    Culture   Final    NO GROWTH 1 DAY Performed at [facility], [address], [place], [place] [address]    Report Status PENDING  Incomplete  Urine culture     Status: None   Collection Time: 08/28/19  4:17 AM   Specimen: In/Out Cath Urine  Result Value Ref Range Status   Specimen Description   Final    IN/OUT CATH URINE Performed at [facility], [address] [place], [place] [address]    Special Requests   Final    NONE Performed at [facility], [address] [place], [place] [address]    Culture   Final    NO GROWTH Performed at [facility], [address] [place], [place] [address]    Report Status 08/29/2019 FINAL  Final         Radiology Studies: US EKG SITE RITE  Result Date: 08/28/2019 If Site Rite image not attached, placement could not be confirmed due to current cardiac rhythm.       Scheduled Meds: . Chlorhexidine Gluconate Cloth  6 each Topical Daily  . feeding supplement  1 Container Oral TID BM  . pantoprazole (PROTONIX) IV  40 mg Intravenous Q12H  . sodium chloride flush  10-40 mL Intracatheter Q12H   Continuous Infusions: . sodium chloride 100 mL/hr (08/29/19 1939)  . magnesium sulfate bolus IVPB    . meropenem (MERREM) IV 1 g (08/30/19 0556)  . TPN ADULT (ION) 40 mL/hr at 08/29/19 1713     LOS: 3 days    Time spent: 35 mins,More than 50% of that time was spent in counseling and/or coordination of care.      Burnadette PopAmrit Ricki Clack, MD [facility] P8/28/2021, 7:42 AM

## 2019-08-30 NOTE — Progress Notes (Signed)
PHARMACY - TOTAL PARENTERAL NUTRITION CONSULT NOTE   Indication: Severe pancreatitis  Patient Measurements: Height: 5\' 11"  (180.3 cm) Weight: 72.3 kg (159 lb 6.3 oz) IBW/kg (Calculated) : 75.3   Body mass index is 22.23 kg/m. Usual Weight:  Current weight: 69 kg  Assessment:  Pt is a 6 yoM with PMH significant for CAD, recent admission for necrotizing pancreatitis, discharged from the hospital on 08/06/19. Pt has had worsening epigastric pain and difficulty tolerating PO. Pharmacy consulted to initiate TPN.   Glucose / Insulin: No history DM. Serum glucose/CBGs elevated, begin sens SSI q6  Electrolytes: Na & Mag still ~ low - replete 8/27 & 8/28, other lytes wnl Renal: wnl, SCr low LFTs / TGs: TG 82 (8/27) Prealbumin / albumin: PreAlb < 5 Intake / Output; MIVF: I/O + ~400 ml, NS incr to 100 ml/hr per MD GI Imaging: Abd CT 8/25: pancreatic necrosis > 50%, gas noted > suspect infection, no overt cholecystitis, pelvic ascites decreased, hepatic steatosis Surgeries / Procedures:   Central access: PICC line ordered TPN start date: 8/27 pending placement of central line  Nutritional Goals: RD recommendations/day Kcal 2200-2400, Protein 110-125 gm Goal rate TPN at 85 ml/hr delivers 2100 Kcal, 118 gm protein  Current Nutrition: adv to CL  Plan:  Mag Sulfate IVPB 2gm x1 KCl runs 10 mEq qhr x4  Increase TPN rate to 50mL/hr at 1800 Electrolytes in TPN: 17mEq/L of Na, 39mEq/L of K, 56mEq/L of Ca, 7.44mEq/L of Mg, and 4mmol/L of Phos. Cl:Ac ratio 1:1Add standard MVI and trace elements to TPN Begin sensitive SSI q6h  MIVF of NS per MD, was increased 8/27 pm to 100 ml/hr from 85 ml/hr Monitor TPN labs on Mon/Thurs BMET, Mag, Phos in am 8/28  9/28, PharmD 08/30/2019,8:42 AM

## 2019-08-31 DIAGNOSIS — K8591 Acute pancreatitis with uninfected necrosis, unspecified: Secondary | ICD-10-CM

## 2019-08-31 LAB — GLUCOSE, CAPILLARY
Glucose-Capillary: 106 mg/dL — ABNORMAL HIGH (ref 70–99)
Glucose-Capillary: 110 mg/dL — ABNORMAL HIGH (ref 70–99)
Glucose-Capillary: 117 mg/dL — ABNORMAL HIGH (ref 70–99)
Glucose-Capillary: 120 mg/dL — ABNORMAL HIGH (ref 70–99)

## 2019-08-31 LAB — BASIC METABOLIC PANEL
Anion gap: 10 (ref 5–15)
BUN: <5 mg/dL — ABNORMAL LOW (ref 6–20)
CO2: 24 mmol/L (ref 22–32)
Calcium: 8 mg/dL — ABNORMAL LOW (ref 8.9–10.3)
Chloride: 100 mmol/L (ref 98–111)
Creatinine, Ser: 0.45 mg/dL — ABNORMAL LOW (ref 0.61–1.24)
GFR calc Af Amer: >60 mL/min (ref >60)
GFR calc non Af Amer: >60 mL/min (ref >60)
Glucose, Bld: 124 mg/dL — ABNORMAL HIGH (ref 70–99)
Potassium: 4 mmol/L (ref 3.5–5.1)
Sodium: 134 mmol/L — ABNORMAL LOW (ref 135–145)

## 2019-08-31 LAB — MAGNESIUM: Magnesium: 1.8 mg/dL (ref 1.7–2.4)

## 2019-08-31 LAB — PHOSPHORUS: Phosphorus: 2.7 mg/dL (ref 2.5–4.6)

## 2019-08-31 MED ORDER — SODIUM CHLORIDE 0.9 % IV SOLN: INTRAVENOUS | Status: DC

## 2019-08-31 MED ORDER — HYDROMORPHONE HCL 1 MG/ML IJ SOLN
0.5000 mg | Freq: Four times a day (QID) | INTRAMUSCULAR | Status: DC | PRN
  Administered 2019-08-31: 0.5 mg via INTRAVENOUS
  Filled 2019-08-31: qty 0.5

## 2019-08-31 MED ORDER — HYDROMORPHONE HCL 1 MG/ML IJ SOLN
0.5000 mg | INTRAMUSCULAR | Status: DC | PRN
  Administered 2019-08-31: 0.5 mg via INTRAVENOUS
  Filled 2019-08-31: qty 0.5

## 2019-08-31 MED ORDER — TRAVASOL 10 % IV SOLN
INTRAVENOUS | Status: AC
  Filled 2019-08-31: qty 1044

## 2019-08-31 NOTE — Progress Notes (Signed)
PHARMACY - TOTAL PARENTERAL NUTRITION CONSULT NOTE   Indication: Severe pancreatitis  Patient Measurements: Height: 5\' 11"  (180.3 cm) Weight: 75.5 kg (166 lb 7.2 oz) IBW/kg (Calculated) : 75.3   Body mass index is 23.21 kg/m. Usual Weight:  Current weight: 69 kg  Assessment:  Pt is a 49 yoM with PMH significant for CAD, recent admission for necrotizing pancreatitis, discharged from the hospital on 08/06/19. Pt has had worsening epigastric pain and difficulty tolerating PO. Pharmacy consulted to initiate TPN.   Glucose / Insulin: No history DM, sens SSI q6, CBGs 126 - 110, low but not yet at goal rate Electrolytes: Na still sl low - much improved, other lytes wnl, CorrCa up to 10 Renal: wnl, SCr low LFTs / TGs: TG 82 (8/27) Prealbumin / albumin: PreAlb < 5 Intake / Output; MIVF: I/O + ~600 ml, NS was incr to 100 ml/hr per MD GI Imaging: Abd CT 8/25: pancreatic necrosis > 50%, gas noted > suspect infection, no overt cholecystitis, pelvic ascites decreased, hepatic steatosis Surgeries / Procedures:   Central access: 8/26 DL PICC placed in pm TPN start date: 8/27  Nutritional Goals: RD recommendations/day Kcal 2200-2400, Protein 110-125 gm Goal rate TPN at 85 ml/hr delivers 2100 Kcal, 118 gm protein  Current Nutrition: adv to CL  Plan:  Increase TPN rate to 75 mL/hr at 1800 Electrolytes in TPN: 167mEq/L of Na, 51mEq/L of K, 68mEq/L of Ca, 7.22mEq/L of Mg, and 86mmol/L of Phos. Cl:Ac ratio 1:1 Add standard MVI and trace elements to TPN Begin sensitive SSI q6h  MIVF of NS was increased by MD 8/27 pm to 100 ml/hr from 85 ml/hr Will reduce IV NS to 65 ml/hr at 1800 today 8/29 Monitor TPN labs on Mon/Thurs  9/29, PharmD 08/31/2019,9:33 AM

## 2019-08-31 NOTE — Progress Notes (Signed)
PROGRESS NOTE    Gary Atkinson  ZOX:[id number] DOB: [birth year] DOA: 08/27/2019 PCP: [facility]   Brief Narrative:  Patient is a 49 year old male with history of coronary artery disease status post stenting, recently admitted for acute necrotizing pancreatitis and was discharged on 08/06/2019 presented with persistent tachycardia, abdominal pain.  After discharge, patient has not been able to tolerate the food.  He was complaining of worsening pain on the epigastric area, was vomiting and also diarrhea.  On presentation he was found to be febrile.  CT abdomen/pelvis done in the emergency department showed acute on chronic pancreatitis with possible infection/organized necrotic tissue with possible obstruction of the splenic vein.  GI consulted.  Started on IV antibiotics, IV fluids.  Started on TPN.  Assessment & Plan:   Principal Problem:   Pancreatitis, necrotizing Active Problems:   CAD (coronary artery disease), native coronary artery   Benign essential HTN   Pancreatic necrosis   Acute pancreatitis   Malnutrition of moderate degree   Acute on chronic pancreatitis: Recently admitted for the management of alcoholic pancreatitis and was discharged on 08/06/2019.  He was following with GI as an outpatient.  CT imaging concerning for 4 walled off necrosis, pancreatic collections, occlusion of the splenic vein..  Started on meropenem, IV fluids.  GI consulted.  Lipase is not significantly elevated. He was also febrile on presentation, currently afebrile. Started on  TPN for suspicion of prolonged hospital course with no oral intake . PICC line placed Started on clear liquid diet on 08/29/2019. Currently pain is well controlled.  Continue current IV pain medications.  History of coronary artery disease: Status post stenting, takes aspirin at home.  Hypertension :Currently  blood pressure stable.  Continue as needed medication for hypertension.  History of alcohol abuse:  Has not taken any alcohol since last admission.  Continue thiamine, folic acid.  He had hypomagnesemia and  is being supplemented.  Iron deficiency /normocytic anemia: Hemoglobin dropped to the range of 8.  No history of bleeding, hematochezia, melena. Iron deficiency. Given a dose  of IV iron.  FOBT is negative.  On Protonix twice daily.  Eventually he will have EGD and colonoscopy as per GI.  Normal vitamin B12, folic acid.  Hypomagnesemia: Supplemented with magnesium.  Hyponatremia: Mild.  Continue to monitor.  Severe protein calorie malnutrition: On TPN.  Albumin of 1.5.  Nutrition Problem: Moderate Malnutrition Etiology: acute illness (necrotizing pancreatitis)      DVT prophylaxis:heparin Rendon Code Status: Full Family Communication: Discussed with sister on phone on 08/30/2019 Status is: Inpatient  Remains inpatient appropriate because:IV treatments appropriate due to intensity of illness or inability to take PO   Dispo: The patient is from: Home              Anticipated d/c is to: Home              Anticipated d/c date is: > 3 days              Patient currently is not medically stable to d/c.     Consultants: GI  Procedures:None  Antimicrobials:  Anti-infectives (From admission, onward)   Start     Dose/Rate Route Frequency Ordered Stop   08/27/19 2200  meropenem (MERREM) 1 g in sodium chloride 0.9 % 100 mL IVPB        1 g 200 mL/hr over 30 Minutes Intravenous Every 8 hours 08/27/19 2149        Subjective:  Patient seen and  examined the bedside this morning.  Hemodynamically stable.  He looked comfortable during my evaluation.  He had some breakthrough pain issues but most of the time he is comfortable.  Tolerating clear liquid diet.  Objective: Vitals:   08/30/19 0500 08/30/19 1100 08/30/19 1400 08/31/19 0333  BP:   138/87 (!) 169/89  Pulse:   79 81  Resp:   20 16  Temp:   98.1 F (36.7 C) 98.2 F (36.8 C)  TempSrc:   Oral Oral  SpO2:   99%   Weight:  72.3 kg 75.5 kg    Height:        Intake/Output Summary (Last 24 hours) at 08/31/2019 4742 Last data filed at 08/31/2019 0400 Gross per 24 hour  Intake 3097.93 ml  Output 2802 ml  Net 295.93 ml   Filed Weights   08/29/19 0500 08/30/19 0500 08/30/19 1100  Weight: 69.1 kg 72.3 kg 75.5 kg    Examination:  General exam: Appears calm and comfortable ,Not in distress,average built HEENT:PERRL,Oral mucosa moist, Ear/Nose normal on gross exam Respiratory system: Bilateral equal air entry, normal vesicular breath sounds, no wheezes or crackles  Cardiovascular system: S1 & S2 heard, RRR. No JVD, murmurs, rubs, gallops or clicks. Gastrointestinal system: Abdomen is nondistended, soft and tender in the epigastric region. No organomegaly or masses felt. Normal bowel sounds heard. Central nervous system: Alert and oriented. No focal neurological deficits. Extremities: No edema, no clubbing ,no cyanosis, distal peripheral pulses palpable. Skin: No rashes, lesions or ulcers,no icterus ,no pallor MSK: Normal muscle bulk,tone ,power Psychiatry: Judgement and insight appear normal. Mood & affect appropriate.    Data Reviewed: I have personally reviewed following labs and imaging studies  CBC: Recent Labs  Lab 08/27/19 1546 08/27/19 2239 08/28/19 0417 08/28/19 0937 08/29/19 0556  WBC 10.6* 10.6* 4.5  --  4.4  NEUTROABS  --   --   --   --  3.0  HGB 10.7* 11.0* 7.2* 7.3* 8.0*  HCT 32.0* 33.3* 21.6* 21.9* 23.7*  MCV 90.4 91.2 90.0  --  88.4  PLT 559* 507* 287  --  267   Basic Metabolic Panel: Recent Labs  Lab 08/27/19 1546 08/28/19 0417 08/29/19 0556 08/30/19 0338 08/31/19 0337  NA 128* 130* 128* 129* 134*  K 3.9 4.0 3.5 3.5 4.0  CL 93* 97* 95* 94* 100  CO2 24 25 22 26 24   GLUCOSE 111* 98 87 125* 124*  BUN 8 9 5* <5* <5*  CREATININE 0.65 0.69 0.53* <0.30* 0.45*  CALCIUM 8.3* 7.7* 7.2* 7.3* 8.0*  MG  --   --  1.2* 1.6* 1.8  PHOS  --   --  4.0 3.1 2.7   GFR: Estimated  Creatinine Clearance: 116.3 mL/min (A) (by C-G formula based on SCr of 0.45 mg/dL (L)). Liver Function Tests: Recent Labs  Lab 08/27/19 1546 08/28/19 0417 08/29/19 0556 08/30/19 0338  AST 72* 44* 31 64*  ALT 52* 35 31 48*  ALKPHOS 159* 100 95 190*  BILITOT 1.0 1.1 1.2 0.9  PROT 8.0 5.9* 5.7* 5.4*  ALBUMIN 2.2* 1.6* 1.6* 1.5*   Recent Labs  Lab 08/27/19 1546  LIPASE 27   No results for input(s): AMMONIA in the last 168 hours. Coagulation Profile: Recent Labs  Lab 08/27/19 2000  INR 1.2   Cardiac Enzymes: No results for input(s): CKTOTAL, CKMB, CKMBINDEX, TROPONINI in the last 168 hours. BNP (last 3 results) No results for input(s): PROBNP in the last 8760 hours. HbA1C: No results for  input(s): HGBA1C in the last 72 hours. CBG: Recent Labs  Lab 08/30/19 0538 08/30/19 1237 08/30/19 1757 08/30/19 2252 08/31/19 0748  GLUCAP 126* 113* 112* 114* 110*   Lipid Profile: Recent Labs    08/29/19 0556  TRIG 82   Thyroid Function Tests: No results for input(s): TSH, T4TOTAL, FREET4, T3FREE, THYROIDAB in the last 72 hours. Anemia Panel: Recent Labs    08/28/19 0937  VITAMINB12 470  FOLATE 13.8  FERRITIN 366*  TIBC 100*  IRON 21*   Sepsis Labs: Recent Labs  Lab 08/27/19 2000 08/27/19 2209  LATICACIDVEN 1.4 1.3    Recent Results (from the past 240 hour(s))  SARS Coronavirus 2 by RT PCR (hospital order, performed in [facility] hospital lab) Nasopharyngeal Nasopharyngeal Swab     Status: None   Collection Time: 08/27/19  7:19 PM   Specimen: Nasopharyngeal Swab  Result Value Ref Range Status   SARS Coronavirus 2 NEGATIVE NEGATIVE Final    Comment: (NOTE) SARS-CoV-2 target nucleic acids are NOT DETECTED.  The SARS-CoV-2 RNA is generally detectable in upper and lower respiratory specimens during the acute phase of infection. The lowest concentration of SARS-CoV-2 viral copies this assay can detect is 250 copies / mL. A negative result does not preclude  SARS-CoV-2 infection and should not be used as the sole basis for treatment or other patient management decisions.  A negative result may occur with improper specimen collection / handling, submission of specimen other than nasopharyngeal swab, presence of viral mutation(s) within the areas targeted by this assay, and inadequate number of viral copies (<250 copies / mL). A negative result must be combined with clinical observations, patient history, and epidemiological information.  Fact Sheet for Patients:   BoilerBrush.com.cy  Fact Sheet for Healthcare Providers: https://pope.com/  This test is not yet approved or  cleared by the [place] FDA and has been authorized for detection and/or diagnosis of SARS-CoV-2 by FDA under an Emergency Use Authorization (EUA).  This EUA will remain in effect (meaning this test can be used) for the duration of the COVID-19 declaration under Section 564(b)(1) of the Act, 21 U.S.C. section 360bbb-3(b)(1), unless the authorization is terminated or revoked sooner.  Performed at [facility], [address] [place], [place] [address]   Blood culture (routine single)     Status: None (Preliminary result)   Collection Time: 08/27/19  8:00 PM   Specimen: BLOOD  Result Value Ref Range Status   Specimen Description   Final    BLOOD RIGHT ANTECUBITAL Performed at [facility], [address], [place], [place] [address]    Special Requests   Final    BOTTLES DRAWN AEROBIC AND ANAEROBIC Blood Culture adequate volume Performed at [facility], [address] [place], [place] [address]    Culture   Final    NO GROWTH 3 DAYS Performed at [facility], [address], [place], [place] [address]    Report Status PENDING  Incomplete  Culture, blood (single)     Status: None (Preliminary result)   Collection Time: 08/27/19 10:09 PM    Specimen: BLOOD  Result Value Ref Range Status   Specimen Description   Final    BLOOD RIGHT ANTECUBITAL Performed at [facility], [address], [place], [place] [address]    Special Requests   Final    BOTTLES DRAWN AEROBIC AND ANAEROBIC Blood Culture results may not be optimal due to an inadequate volume of  blood received in culture bottles Performed at [facility], [address], [place], [place] [address]    Culture   Final    NO GROWTH 2 DAYS Performed at [facility], [address], [place], [place] [address]    Report Status PENDING  Incomplete  Urine culture     Status: None   Collection Time: 08/28/19  4:17 AM   Specimen: In/Out Cath Urine  Result Value Ref Range Status   Specimen Description   Final    IN/OUT CATH URINE Performed at [facility], [address] [place], [place] [address]    Special Requests   Final    NONE Performed at [facility], [address], [place], [place] [address]    Culture   Final    NO GROWTH Performed at [facility], [address], [place], [place] [address]    Report Status 08/29/2019 FINAL  Final         Radiology Studies: No results found.      Scheduled Meds: . busPIRone  30 mg Oral QHS  . Chlorhexidine Gluconate Cloth  6 each Topical Daily  . feeding supplement  1 Container Oral TID BM  . insulin aspart  0-9 Units Subcutaneous Q6H  . pantoprazole (PROTONIX) IV  40 mg Intravenous Q12H  . QUEtiapine  50 mg Oral QHS  . sodium chloride flush  10-40 mL Intracatheter Q12H   Continuous Infusions: . sodium chloride 100 mL/hr at 08/31/19 0338  . meropenem (MERREM) IV 1 g (08/31/19 0720)  . TPN ADULT (ION) 60 mL/hr at 08/30/19 1705     LOS: 4 days    Time spent: 35 mins,More than 50% of that time was spent in counseling and/or coordination of care.      Burnadette Pop, MD [facility] P8/29/2021, 8:33 AM

## 2019-08-31 NOTE — Progress Notes (Addendum)
Daily Rounding Note  08/31/2019, 10:17 AM  LOS: 4 days   SUBJECTIVE:   Chief complaint:  Necrotizing pancreatitis     Still has L >> R abd pain, though not so bad overnight.  Pain not worse w clear liquids.  No vomiting  OBJECTIVE:         Vital signs in last 24 hours:    Temp:  [98.1 F (36.7 C)-98.2 F (36.8 C)] 98.2 F (36.8 C) (08/29 0333) Pulse Rate:  [79-81] 81 (08/29 0333) Resp:  [16-20] 16 (08/29 0333) BP: (138-169)/(87-89) 169/89 (08/29 0333) SpO2:  [99 %] 99 % (08/28 1400) Weight:  [75.5 kg] 75.5 kg (08/28 1100) Last BM Date: 08/31/19 Filed Weights   08/29/19 0500 08/30/19 0500 08/30/19 1100  Weight: 69.1 kg 72.3 kg 75.5 kg   General: looks moderately ill   Heart: RRR Chest: clear bil.   Abdomen: soft, moderate tenderness Bil.  Active BS.    Extremities: no CCE Neuro/Psych:  Oriented x 3.  Alert.   Skin: Pale, no jaundice  Intake/Output from previous day: 08/28 0701 - 08/29 0700 In: 3097.9 [P.O.:720; I.V.:1895; IV Piggyback:482.9] Out: 3559 [Urine:3050; Stool:202]  Intake/Output this shift: No intake/output data recorded.  Lab Results: Recent Labs    08/29/19 0556  WBC 4.4  HGB 8.0*  HCT 23.7*  PLT 267   BMET Recent Labs    08/29/19 0556 08/30/19 0338 08/31/19 0337  NA 128* 129* 134*  K 3.5 3.5 4.0  CL 95* 94* 100  CO2 22 26 24   GLUCOSE 87 125* 124*  BUN 5* <5* <5*  CREATININE 0.53* <0.30* 0.45*  CALCIUM 7.2* 7.3* 8.0*   LFT Recent Labs    08/29/19 0556 08/30/19 0338  PROT 5.7* 5.4*  ALBUMIN 1.6* 1.5*  AST 31 64*  ALT 31 48*  ALKPHOS 95 190*  BILITOT 1.2 0.9   PT/INR No results for input(s): LABPROT, INR in the last 72 hours. Hepatitis Panel No results for input(s): HEPBSAG, HCVAB, HEPAIGM, HEPBIGM in the last 72 hours.  Studies/Results: No results found.  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  Walled off  necrosis. Causing CBD dilation to 57m.    D 5 Meropenem, resolved leukocytosis.  abd pain persists,  Not unexpected  *   Anemia.  Normal MCV.  Low iron, low TIBC.  Folate, b12, ferritin ok.   Hgb 7.2 >> PRBC x 1 >> 9.   Feraheme 8/27  *   Hyponatremia, improved  *  Prot cal malnutrition.  Pre-alb <5.  Wt loss. TPN in place.    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    *   Fatty liver due to ETOH.  Alk phos >> transaminase elevation, t bili wnl.    PLAN   *  cmet in AM, continue supportive care.  If able to tolerate clears through tmrw, ? Advance diet in AM?     SAzucena Freed 08/31/2019, 10:17 AM Phone [phone]  I have discussed the case with the PA, and that is the plan I formulated. I personally interviewed and examined the patient.  His infected necrotizing pancreatitis is clinically stable.  WBC has normalized.  He will continue to have pain and tenderness for quite some time. Hemoglobin stable after transfusion.  No overt GI bleeding.  He is at risk for upper  GI bleeding with collaterals/varices noted on imaging due to the severity of his pancreatitis.  Severe protein calorie malnutrition from the severity of his pancreatitis and the resulting loss of appetite and oral intake.  He is tolerating TPN without difficulty.  Elevated alkaline phosphatase, unclear cause.  Bilirubin normal, mild CBD dilatation on imaging. Follow hepatic function panel every few days, next level tomorrow.  I do not know whether endoscopic necrosectomy would be feasible or indicated.  Dr. Fuller Plan will be assuming the consult service tomorrow, and I will suggest case be reviewed by our advanced endoscopist with that question.    Do not advance diet.  Total time 25 minutes.  Nelida Meuse III Office: [phone]

## 2019-09-01 DIAGNOSIS — R945 Abnormal results of liver function studies: Secondary | ICD-10-CM

## 2019-09-01 LAB — COMPREHENSIVE METABOLIC PANEL
ALT: 41 U/L (ref 0–44)
AST: 41 U/L (ref 15–41)
Albumin: 1.6 g/dL — ABNORMAL LOW (ref 3.5–5.0)
Alkaline Phosphatase: 141 U/L — ABNORMAL HIGH (ref 38–126)
Anion gap: 8 (ref 5–15)
BUN: 5 mg/dL — ABNORMAL LOW (ref 6–20)
CO2: 26 mmol/L (ref 22–32)
Calcium: 8.2 mg/dL — ABNORMAL LOW (ref 8.9–10.3)
Chloride: 99 mmol/L (ref 98–111)
Creatinine, Ser: 0.42 mg/dL — ABNORMAL LOW (ref 0.61–1.24)
GFR calc Af Amer: >60 mL/min (ref >60)
GFR calc non Af Amer: >60 mL/min (ref >60)
Glucose, Bld: 130 mg/dL — ABNORMAL HIGH (ref 70–99)
Potassium: 4.4 mmol/L (ref 3.5–5.1)
Sodium: 133 mmol/L — ABNORMAL LOW (ref 135–145)
Total Bilirubin: 0.3 mg/dL (ref 0.3–1.2)
Total Protein: 5.9 g/dL — ABNORMAL LOW (ref 6.5–8.1)

## 2019-09-01 LAB — CULTURE, BLOOD (SINGLE)
Culture: NO GROWTH
Special Requests: ADEQUATE

## 2019-09-01 LAB — DIFFERENTIAL
Abs Immature Granulocytes: 0.03 10*3/uL (ref 0.00–0.07)
Basophils Absolute: 0 10*3/uL (ref 0.0–0.1)
Basophils Relative: 0 %
Eosinophils Absolute: 0.1 10*3/uL (ref 0.0–0.5)
Eosinophils Relative: 2 %
Immature Granulocytes: 1 %
Lymphocytes Relative: 22 %
Lymphs Abs: 1 10*3/uL (ref 0.7–4.0)
Monocytes Absolute: 0.5 10*3/uL (ref 0.1–1.0)
Monocytes Relative: 11 %
Neutro Abs: 2.9 10*3/uL (ref 1.7–7.7)
Neutrophils Relative %: 64 %

## 2019-09-01 LAB — GLUCOSE, CAPILLARY
Glucose-Capillary: 110 mg/dL — ABNORMAL HIGH (ref 70–99)
Glucose-Capillary: 124 mg/dL — ABNORMAL HIGH (ref 70–99)
Glucose-Capillary: 126 mg/dL — ABNORMAL HIGH (ref 70–99)
Glucose-Capillary: 129 mg/dL — ABNORMAL HIGH (ref 70–99)

## 2019-09-01 LAB — PREALBUMIN: Prealbumin: <5 mg/dL — ABNORMAL LOW (ref 18–38)

## 2019-09-01 LAB — CBC
HCT: 25.8 % — ABNORMAL LOW (ref 39.0–52.0)
Hemoglobin: 8.4 g/dL — ABNORMAL LOW (ref 13.0–17.0)
MCH: 29.6 pg (ref 26.0–34.0)
MCHC: 32.6 g/dL (ref 30.0–36.0)
MCV: 90.8 fL (ref 80.0–100.0)
Platelets: 296 10*3/uL (ref 150–400)
RBC: 2.84 MIL/uL — ABNORMAL LOW (ref 4.22–5.81)
RDW: 14.6 % (ref 11.5–15.5)
WBC: 4.5 10*3/uL (ref 4.0–10.5)
nRBC: 0 % (ref 0.0–0.2)

## 2019-09-01 LAB — TRIGLYCERIDES: Triglycerides: 138 mg/dL (ref <150)

## 2019-09-01 LAB — PHOSPHORUS: Phosphorus: 4.1 mg/dL (ref 2.5–4.6)

## 2019-09-01 LAB — MAGNESIUM: Magnesium: 1.8 mg/dL (ref 1.7–2.4)

## 2019-09-01 MED ORDER — LOPERAMIDE HCL 2 MG PO CAPS
2.0000 mg | ORAL_CAPSULE | ORAL | Status: DC | PRN
  Administered 2019-09-01: 2 mg via ORAL
  Filled 2019-09-01: qty 1

## 2019-09-01 MED ORDER — TRAVASOL 10 % IV SOLN
INTRAVENOUS | Status: AC
  Filled 2019-09-01: qty 1231.2

## 2019-09-01 MED ORDER — MORPHINE SULFATE (PF) 2 MG/ML IV SOLN
2.0000 mg | INTRAVENOUS | Status: DC | PRN
  Administered 2019-09-01 – 2019-09-02 (×2): 2 mg via INTRAVENOUS
  Filled 2019-09-01 (×2): qty 1

## 2019-09-01 MED ORDER — CLONAZEPAM 0.5 MG PO TABS
0.5000 mg | ORAL_TABLET | Freq: Four times a day (QID) | ORAL | Status: DC | PRN
  Administered 2019-09-01 – 2019-09-09 (×3): 0.5 mg via ORAL
  Filled 2019-09-01 (×3): qty 1

## 2019-09-01 MED ORDER — HYDROMORPHONE HCL 1 MG/ML IJ SOLN: 0.5000 mg | INTRAMUSCULAR | Status: DC | PRN

## 2019-09-01 MED ORDER — CLONAZEPAM 0.5 MG PO TBDP: 1.0000 mg | ORAL_TABLET | Freq: Every day | ORAL | Status: DC

## 2019-09-01 NOTE — Progress Notes (Signed)
PHARMACY - TOTAL PARENTERAL NUTRITION CONSULT NOTE   Indication: Severe pancreatitis  Patient Measurements: Height: _0  (180.3 cm) Weight: 75.5 kg (166 lb 7.2 oz) IBW/kg (Calculated) : 75.3   Body mass index is 23.21 kg/m.    Assessment:  35 y/oM with PMH significant for CAD, recent admission for necrotizing pancreatitis, discharged from the hospital on 08/06/19. Patient has had worsening epigastric pain and difficulty tolerating PO. Pharmacy consulted to initiate TPN.   Glucose / Insulin: No history DM. CBGs 106-129 (goal 100-150). 0 units SSI required over last 24 hours.   Electrolytes: Na+ still slightly low/decreased. Others, including Corrected Ca, WNL Renal: SCr low LFTs / TGs: AST/ALT, Tbili WNL. Alk Phos elevated, but trending down. TG 82 (8/27), 138 (8/30) Prealbumin / albumin: < 5/1.6 Intake / Output; MIVF: I/O 4503/5525, MIVF: NS at 75 ml/hr GI Imaging: Abd CT 8/25: pancreatic necrosis > 50%, gas noted > suspect infection, no overt cholecystitis, pelvic ascites decreased, hepatic steatosis Surgeries / Procedures:   Central access: 8/26 double lumen PICC placed in PM TPN start date: 8/27  Nutritional Goals: RD recommendations (8/27) Kcal 2200-2400, Protein 110-125 grams/day  TPN at goal rate of 95 ml/hr delivers 2225 Kcal, 123 grams protein  Current Nutrition: clear liquid diet, TPN, Boost/Resource Breeze TID (refusing most)  Plan:  Increase TPN to goal rate of 95 mL/hr at 1800 Electrolytes in TPN: 113mq/L of Na, 552m/L of K, 30m830mL of Ca, 7.30mE230m of Mag, and 12mm38m of Phos. Cl:Ac ratio 1:1 Add standard MVI and trace elements to TPN Continue sensitive SSI q6h  MIVF per MD: NS at 730ml/78mMonitor TPN labs on Mon/Thurs CMET, Mag, Phos in AM   Christyna Letendre Lindell SparmD, BCPS Clinical Pharmacist  09/01/2019,11:02 AM

## 2019-09-01 NOTE — Progress Notes (Addendum)
[facility] Progress Note  CC:  Necrotizing EtOH pancreatitis  Subjective:  He is concerned about his pain management. He wishes to have adequate pain control without oversedation. Dr. Tawanna Solo at the bed side, changed Dilaudid to Q 4 hours PRN. No N/V. He is tolerating a clear liquid diet. Last BM was yesterday, described as brown and sandy. No melena or rectal bleeding. No BM yet today.    Objective:  Vital signs in last 24 hours: Temp:  [98.6 F (37 C)-99.4 F (37.4 C)] 99.4 F (37.4 C) (08/30 0624) Pulse Rate:  [82-91] 82 (08/30 0624) Resp:  [18] 18 (08/30 0624) BP: (131-145)/(85-97) 145/97 (08/30 0624) SpO2:  [98 %-100 %] 100 % (08/30 0624) Last BM Date: 08/31/19 General:   Alert 49 year old male in NAD.  Heart: RRR, no murmur.  Pulm: Breath sounds clear throughout.  Abdomen: Soft, nondistended. Upper abdominal tenderness without rebound or guarding. + BX x 4 quads. No HSM.  Extremities:  Without edema. Neurologic:  Alert and  oriented x4;  grossly normal neurologically. Psych:  Alert and cooperative. Normal mood and affect.  Intake/Output from previous day: 08/29 0701 - 08/30 0700 In: 4502.6 [P.O.:2340; I.V.:1762.6; IV Piggyback:400] Out: 2820 [Urine:5525] Intake/Output this shift: No intake/output data recorded.  Lab Results: Recent Labs    09/01/19 0354  WBC 4.5  HGB 8.4*  HCT 25.8*  PLT 296   BMET Recent Labs    08/30/19 0338 08/31/19 0337 09/01/19 0354  NA 129* 134* 133*  K 3.5 4.0 4.4  CL 94* 100 99  CO2 26 24 26   GLUCOSE 125* 124* 130*  BUN <5* <5* 5*  CREATININE <0.30* 0.45* 0.42*  CALCIUM 7.3* 8.0* 8.2*   LFT Recent Labs    09/01/19 0354  PROT 5.9*  ALBUMIN 1.6*  AST 41  ALT 41  ALKPHOS 141*  BILITOT 0.3    No results found.  Assessment / Plan:  69. 49 year old male acute on chronicEtOH necrotizingpancreatitis.CTAP 8/25 showed persistent walled off pancreatic necrosis >50% of the pancreas with a small  locules of gas in the pancreatic head concerning for infectious process. Hepatic steatosis noted. Spike a temp 102.62F on 8/25.  Today WBC 4.5. He is Afebrile. LFTs mildly elevated after TPN started 8/27. LFTs drifting downward. Alk phos 190 -> 140. AST 64 -> 41. ALT 48 -> 41. Normal T. Bili, lipase and triglyceride levels. Abdominal pain is controlled. Hemodynamically stable. -Continue clear liquid diet -Continue TPN -ContinueMeropenem 1 g IV every 8 hours -Pain management per thehospitalist, may consider PCA  -Repeat CBC and CMP in a.m.  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 He is at increased risk of GI bleed secondary to gastric varices.   3.Normocyticanemia.Hemoglobin 7.2 on 8/26.MCV 90.0. Iron 21. History of black melenic stoolsx 2 to 3 weeks prior to admission. FOBT negative. Transfused 1 unit of PRBCs 8/26. Received IV Feraheme x 1.  Hg stable at 8.4.  -Continue Pantoprazole 27m IV bid -Repeat CBC in AM, check stat H/H if he develops any overt GI bleeding  -Transfuse for Hg < 7 -FOBT -Eventual EGD and colonoscopy  4. Hyponatremia. Na+ 133.   Principal Problem:   Pancreatitis, necrotizing Active Problems:   CAD (coronary artery disease), native coronary artery   Benign essential HTN   Pancreatic necrosis   Acute pancreatitis   Malnutrition of moderate degree    LOS: 5 days  Noralyn Pick  09/01/2019, 9:15 AM     Attending Physician Note   I have taken an interval history, reviewed the chart and examined the patient. I agree with the Advanced Practitioner's note, impression and recommendations.   Severe necrotizing etoh pancreatitis with walled off pancreatic necrosis LFTs improving Splenic vein occlusion with gastric varices Anemia   Continue Meropenem, TPN, clear liquid diet, trend CMP and CBC Pain mgmt per  hospitalists  Lucio Edward, MD [facility]

## 2019-09-01 NOTE — Progress Notes (Signed)
PROGRESS NOTE    Gary Atkinson  GGE:[id number] DOB: 1970/08/30 DOA: 08/27/2019 PCP: [facility]   Brief Narrative:  Patient is a 49 year old male with history of coronary artery disease status post stenting, recently admitted for acute necrotizing pancreatitis and was discharged on 08/06/2019 presented with persistent tachycardia, abdominal pain.  After discharge, patient has not been able to tolerate the food.  He was complaining of worsening pain on the epigastric area, was vomiting and also diarrhea.  On presentation he was found to be febrile.  CT abdomen/pelvis done in the emergency department showed acute on chronic pancreatitis with possible infection/organized necrotic tissue with possible obstruction of the splenic vein.  GI consulted.  Started on IV antibiotics, IV fluids.  Started on TPN.  Assessment & Plan:   Principal Problem:   Pancreatitis, necrotizing Active Problems:   CAD (coronary artery disease), native coronary artery   Benign essential HTN   Pancreatic necrosis   Acute pancreatitis   Malnutrition of moderate degree   Acute on chronic pancreatitis: Recently admitted for the management of alcoholic pancreatitis and was discharged on 08/06/2019.  He was following with GI as an outpatient.  CT imaging concerning for 4 walled off necrosis, pancreatic collections, occlusion of the splenic vein..  Started on meropenem, IV fluids.  GI consulted.  Lipase is not significantly elevated. He was also febrile on presentation, currently afebrile. Started on  TPN for suspicion of prolonged hospital course with no oral intake . PICC line placed Started on clear liquid diet on 08/29/2019. Currently pain is well controlled.  Continue current IV pain medications.  History of coronary artery disease: Status post stenting, takes aspirin at home.  Hypertension :Currently  blood pressure stable.  Continue as needed medication for hypertension.  History of alcohol abuse:  Has not taken any alcohol since last admission.  Continue thiamine, folic acid.  He had hypomagnesemia and  is being supplemented.  Iron deficiency /normocytic anemia: Hemoglobin dropped to the range of 8.  No history of bleeding, hematochezia, melena. Iron deficiency. Given a dose  of IV iron.  FOBT is negative.  On Protonix twice daily.  Eventually he will have EGD and colonoscopy as per GI.  Normal vitamin B12, folic acid.  Hypomagnesemia: Supplemented with magnesium.  Continue to monitor.  Hyponatremia: Mild.  Continue to monitor.  Severe protein calorie malnutrition: On TPN.  Albumin of 1.5.  Nutrition Problem: Moderate Malnutrition Etiology: acute illness (necrotizing pancreatitis)      DVT prophylaxis:heparin [place] Code Status: Full Family Communication: Discussed with sister on phone on 08/30/2019 Status is: Inpatient  Remains inpatient appropriate because:IV treatments appropriate due to intensity of illness or inability to take PO   Dispo: The patient is from: Home              Anticipated d/c is to: Home              Anticipated d/c date is: > 3 days              Patient currently is not medically stable to d/c.  Needs GI clearance before discharge.  Still on TPN.   Consultants: GI  Procedures:None  Antimicrobials:  Anti-infectives (From admission, onward)   Start     Dose/Rate Route Frequency Ordered Stop   08/27/19 2200  meropenem (MERREM) 1 g in sodium chloride 0.9 % 100 mL IVPB        1 g 200 mL/hr over 30 Minutes Intravenous Every 8 hours 08/27/19 2149  Subjective:   Patient seen and examined the bedside this morning.  Hemodynamically stable.  Comfortable.  No nausea or vomiting.  Abdomen pain is not severe.  Having bowel movements.  Tolerating clear liquid diet.  Objective: Vitals:   08/31/19 0333 08/31/19 1304 08/31/19 2056 09/01/19 0624  BP: (!) 169/89 131/85 140/85 (!) 145/97  Pulse: 81 90 91 82  Resp: 16   18  Temp: 98.2 F (36.8 C) 98.8  F (37.1 C) 98.6 F (37 C) 99.4 F (37.4 C)  TempSrc: Oral Oral Oral Oral  SpO2:  98% 100% 100%  Weight:      Height:        Intake/Output Summary (Last 24 hours) at 09/01/2019 0815 Last data filed at 09/01/2019 0540 Gross per 24 hour  Intake 4070.88 ml  Output 5525 ml  Net -1454.12 ml   Filed Weights   08/29/19 0500 08/30/19 0500 08/30/19 1100  Weight: 69.1 kg 72.3 kg 75.5 kg    Examination:   General exam: Appears calm and comfortable ,Not in distress,average built HEENT:PERRL,Oral mucosa moist, Ear/Nose normal on gross exam Respiratory system: Bilateral equal air entry, normal vesicular breath sounds, no wheezes or crackles  Cardiovascular system: S1 & S2 heard, RRR. No JVD, murmurs, rubs, gallops or clicks. Gastrointestinal system: Abdomen is nondistended, soft and tender in the epigastric area. No organomegaly or masses felt. Normal bowel sounds heard. Central nervous system: Alert and oriented. No focal neurological deficits. Extremities: No edema, no clubbing ,no cyanosis, distal peripheral pulses palpable. Skin: No rashes, lesions or ulcers,no icterus ,no pallor     Data Reviewed: I have personally reviewed following labs and imaging studies  CBC: Recent Labs  Lab 08/27/19 1546 08/27/19 1546 08/27/19 2239 08/28/19 0417 08/28/19 0937 08/29/19 0556 09/01/19 0354  WBC 10.6*  --  10.6* 4.5  --  4.4 4.5  NEUTROABS  --   --   --   --   --  3.0 2.9  HGB 10.7*   < > 11.0* 7.2* 7.3* 8.0* 8.4*  HCT 32.0*   < > 33.3* 21.6* 21.9* 23.7* 25.8*  MCV 90.4  --  91.2 90.0  --  88.4 90.8  PLT 559*  --  507* 287  --  267 296   < > = values in this interval not displayed.   Basic Metabolic Panel: Recent Labs  Lab 08/28/19 0417 08/29/19 0556 08/30/19 0338 08/31/19 0337 09/01/19 0354  NA 130* 128* 129* 134* 133*  K 4.0 3.5 3.5 4.0 4.4  CL 97* 95* 94* 100 99  CO2 25 22 26 24 26   GLUCOSE 98 87 125* 124* 130*  BUN 9 5* <5* <5* 5*  CREATININE 0.69 0.53* <0.30* 0.45*  0.42*  CALCIUM 7.7* 7.2* 7.3* 8.0* 8.2*  MG  --  1.2* 1.6* 1.8 1.8  PHOS  --  4.0 3.1 2.7 4.1   GFR: Estimated Creatinine Clearance: 116.3 mL/min (A) (by C-G formula based on SCr of 0.42 mg/dL (L)). Liver Function Tests: Recent Labs  Lab 08/27/19 1546 08/28/19 0417 08/29/19 0556 08/30/19 0338 09/01/19 0354  AST 72* 44* 31 64* 41  ALT 52* 35 31 48* 41  ALKPHOS 159* 100 95 190* 141*  BILITOT 1.0 1.1 1.2 0.9 0.3  PROT 8.0 5.9* 5.7* 5.4* 5.9*  ALBUMIN 2.2* 1.6* 1.6* 1.5* 1.6*   Recent Labs  Lab 08/27/19 1546  LIPASE 27   No results for input(s): AMMONIA in the last 168 hours. Coagulation Profile: Recent Labs  Lab 08/27/19 2000  INR 1.2   Cardiac Enzymes: No results for input(s): CKTOTAL, CKMB, CKMBINDEX, TROPONINI in the last 168 hours. BNP (last 3 results) No results for input(s): PROBNP in the last 8760 hours. HbA1C: No results for input(s): HGBA1C in the last 72 hours. CBG: Recent Labs  Lab 08/31/19 0748 08/31/19 1147 08/31/19 1659 08/31/19 2338 09/01/19 0545  GLUCAP 110* 120* 106* 117* 129*   Lipid Profile: Recent Labs    09/01/19 0354  TRIG 138   Thyroid Function Tests: No results for input(s): TSH, T4TOTAL, FREET4, T3FREE, THYROIDAB in the last 72 hours. Anemia Panel: No results for input(s): VITAMINB12, FOLATE, FERRITIN, TIBC, IRON, RETICCTPCT in the last 72 hours. Sepsis Labs: Recent Labs  Lab 08/27/19 2000 08/27/19 2209  LATICACIDVEN 1.4 1.3    Recent Results (from the past 240 hour(s))  SARS Coronavirus 2 by RT PCR (hospital order, performed in [facility] hospital lab) Nasopharyngeal Nasopharyngeal Swab     Status: None   Collection Time: 08/27/19  7:19 PM   Specimen: Nasopharyngeal Swab  Result Value Ref Range Status   SARS Coronavirus 2 NEGATIVE NEGATIVE Final    Comment: (NOTE) SARS-CoV-2 target nucleic acids are NOT DETECTED.  The SARS-CoV-2 RNA is generally detectable in upper and lower respiratory specimens during the acute  phase of infection. The lowest concentration of SARS-CoV-2 viral copies this assay can detect is 250 copies / mL. A negative result does not preclude SARS-CoV-2 infection and should not be used as the sole basis for treatment or other patient management decisions.  A negative result may occur with improper specimen collection / handling, submission of specimen other than nasopharyngeal swab, presence of viral mutation(s) within the areas targeted by this assay, and inadequate number of viral copies (<250 copies / mL). A negative result must be combined with clinical observations, patient history, and epidemiological information.  Fact Sheet for Patients:   BoilerBrush.com.cy  Fact Sheet for Healthcare Providers: https://pope.com/  This test is not yet approved or  cleared by the [place] FDA and has been authorized for detection and/or diagnosis of SARS-CoV-2 by FDA under an Emergency Use Authorization (EUA).  This EUA will remain in effect (meaning this test can be used) for the duration of the COVID-19 declaration under Section 564(b)(1) of the Act, 21 U.S.C. section 360bbb-3(b)(1), unless the authorization is terminated or revoked sooner.  Performed at [facility], [address], [place], [place] [address]   Blood culture (routine single)     Status: None (Preliminary result)   Collection Time: 08/27/19  8:00 PM   Specimen: BLOOD  Result Value Ref Range Status   Specimen Description   Final    BLOOD RIGHT ANTECUBITAL Performed at [facility], [address], [place], [place] [address]    Special Requests   Final    BOTTLES DRAWN AEROBIC AND ANAEROBIC Blood Culture adequate volume Performed at [facility], [address] [place], [place] [address]    Culture   Final    NO GROWTH 4 DAYS Performed at [facility], [address] [place], [place]  [address]    Report Status PENDING  Incomplete  Culture, blood (single)     Status: None (Preliminary result)   Collection Time: 08/27/19 10:09 PM   Specimen: BLOOD  Result Value Ref Range Status   Specimen Description   Final    BLOOD RIGHT ANTECUBITAL Performed at [facility], [address] [place], [place] [address]  Special Requests   Final    BOTTLES DRAWN AEROBIC AND ANAEROBIC Blood Culture results may not be optimal due to an inadequate volume of blood received in culture bottles Performed at [facility], [address], [place], [place] [address]    Culture   Final    NO GROWTH 3 DAYS Performed at [facility], [address], [place], [place] [address]    Report Status PENDING  Incomplete  Urine culture     Status: None   Collection Time: 08/28/19  4:17 AM   Specimen: In/Out Cath Urine  Result Value Ref Range Status   Specimen Description   Final    IN/OUT CATH URINE Performed at [facility], [address], [place], [place] [address]    Special Requests   Final    NONE Performed at [facility], [address], [place], [place] [address]    Culture   Final    NO GROWTH Performed at [facility], [address], [place], [place] [address]    Report Status 08/29/2019 FINAL  Final         Radiology Studies: No results found.      Scheduled Meds: . busPIRone  30 mg Oral QHS  . Chlorhexidine Gluconate Cloth  6 each Topical Daily  . feeding supplement  1 Container Oral TID BM  . insulin aspart  0-9 Units Subcutaneous Q6H  . pantoprazole (PROTONIX) IV  40 mg Intravenous Q12H  . QUEtiapine  50 mg Oral QHS  . sodium chloride flush  10-40 mL Intracatheter Q12H   Continuous Infusions: . sodium chloride 65 mL/hr at 08/31/19 1801  . meropenem (MERREM) IV 1 g (09/01/19 0540)  . TPN ADULT (ION) 75 mL/hr at 08/31/19 1659     LOS: 5 days    Time spent: 35 mins,More  than 50% of that time was spent in counseling and/or coordination of care.      Burnadette Pop, MD [facility] P8/30/2021, 8:15 AM

## 2019-09-02 DIAGNOSIS — K219 Gastro-esophageal reflux disease without esophagitis: Secondary | ICD-10-CM

## 2019-09-02 DIAGNOSIS — E440 Moderate protein-calorie malnutrition: Secondary | ICD-10-CM

## 2019-09-02 LAB — COMPREHENSIVE METABOLIC PANEL
ALT: 48 U/L — ABNORMAL HIGH (ref 0–44)
AST: 54 U/L — ABNORMAL HIGH (ref 15–41)
Albumin: 1.6 g/dL — ABNORMAL LOW (ref 3.5–5.0)
Alkaline Phosphatase: 118 U/L (ref 38–126)
Anion gap: 9 (ref 5–15)
BUN: 7 mg/dL (ref 6–20)
CO2: 26 mmol/L (ref 22–32)
Calcium: 8.2 mg/dL — ABNORMAL LOW (ref 8.9–10.3)
Chloride: 97 mmol/L — ABNORMAL LOW (ref 98–111)
Creatinine, Ser: 0.44 mg/dL — ABNORMAL LOW (ref 0.61–1.24)
GFR calc Af Amer: >60 mL/min (ref >60)
GFR calc non Af Amer: >60 mL/min (ref >60)
Glucose, Bld: 139 mg/dL — ABNORMAL HIGH (ref 70–99)
Potassium: 4 mmol/L (ref 3.5–5.1)
Sodium: 132 mmol/L — ABNORMAL LOW (ref 135–145)
Total Bilirubin: 0.6 mg/dL (ref 0.3–1.2)
Total Protein: 6 g/dL — ABNORMAL LOW (ref 6.5–8.1)

## 2019-09-02 LAB — GLUCOSE, CAPILLARY
Glucose-Capillary: 129 mg/dL — ABNORMAL HIGH (ref 70–99)
Glucose-Capillary: 135 mg/dL — ABNORMAL HIGH (ref 70–99)
Glucose-Capillary: 117 mg/dL — ABNORMAL HIGH (ref 70–99)

## 2019-09-02 LAB — PHOSPHORUS: Phosphorus: 4.3 mg/dL (ref 2.5–4.6)

## 2019-09-02 LAB — CULTURE, BLOOD (SINGLE): Culture: NO GROWTH

## 2019-09-02 LAB — MAGNESIUM: Magnesium: 1.6 mg/dL — ABNORMAL LOW (ref 1.7–2.4)

## 2019-09-02 MED ORDER — DEXLANSOPRAZOLE 60 MG PO CPDR
60.0000 mg | DELAYED_RELEASE_CAPSULE | Freq: Every day | ORAL | Status: DC
  Administered 2019-09-02 – 2019-09-11 (×10): 60 mg via ORAL
  Filled 2019-09-02 (×19): qty 1

## 2019-09-02 MED ORDER — MAGNESIUM SULFATE 2 GM/50ML IV SOLN
2.0000 g | Freq: Once | INTRAVENOUS | Status: AC
  Administered 2019-09-02: 2 g via INTRAVENOUS
  Filled 2019-09-02: qty 50

## 2019-09-02 MED ORDER — SUCRALFATE 1 GM/10ML PO SUSP
1.0000 g | Freq: Four times a day (QID) | ORAL | Status: DC
  Administered 2019-09-02 – 2019-09-11 (×36): 1 g via ORAL
  Filled 2019-09-02 (×36): qty 10

## 2019-09-02 MED ORDER — TRAVASOL 10 % IV SOLN
INTRAVENOUS | Status: AC
  Filled 2019-09-02: qty 1231.2

## 2019-09-02 MED ORDER — MORPHINE SULFATE (PF) 4 MG/ML IV SOLN
3.0000 mg | INTRAVENOUS | Status: DC | PRN
  Administered 2019-09-02 – 2019-09-11 (×17): 3 mg via INTRAVENOUS
  Filled 2019-09-02 (×18): qty 1

## 2019-09-02 NOTE — Progress Notes (Signed)
PROGRESS NOTE    Gary Atkinson  ZOX:[id number] DOB: 1970-01-14 DOA: 08/27/2019 PCP: [facility]   Brief Narrative:  Patient is a 49 year old male with history of coronary artery disease status post stenting, recently admitted for acute necrotizing pancreatitis and was discharged on 08/06/2019 presented with persistent abdominal pain.  After discharge, patient has not been able to tolerate the food.  He was complaining of worsening pain on the epigastric area, was vomiting and also diarrhea.    CT abdomen/pelvis done in the emergency department showed acute on chronic pancreatitis with possible infection/organized necrotic tissue with possible obstruction of the splenic vein.  GI consulted.  Started on IV antibiotics, IV fluids.  Started on TPN because of expected prolonged decreased oral intake.  Assessment & Plan:   Principal Problem:   Pancreatitis, necrotizing Active Problems:   CAD (coronary artery disease), native coronary artery   Benign essential HTN   Pancreatic necrosis   Acute pancreatitis   Malnutrition of moderate degree   Acute on chronic pancreatitis: Recently admitted for the management of alcoholic pancreatitis and was discharged on 08/06/2019.  He was following with GI as an outpatient.  CT imaging concerning for walled off necrosis, pancreatic collections, occlusion of the splenic vein..  Started on meropenem, IV fluids.  GI consulted.  Lipase was not significantly elevated. He was also febrile on presentation, currently afebrile. Started on  TPN for suspicion of prolonged hospital course with no oral intake . PICC line placed Also started on clear liquid diet on 08/29/2019 and he is tolerating. Currently pain is well controlled.  Continue current IV pain medications, antiemetics GI also considering endoscopic necrosectomy.  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  Since this is may not be an  acute finding,  anticoagulation not  started.  He has ncreased risk of GI bleed secondary to gastric varices  History of coronary artery disease: Status post stenting, takes aspirin at home.  Hypertension :Currently  blood pressure stable.  Continue as needed medication for hypertension.  History of alcohol abuse: Has not taken any alcohol since last admission.  Continue thiamine, folic acid.    Iron deficiency /normocytic anemia: Hemoglobin dropped in the range of 8.  He has been transfused with a unit of PRBC on 08/28/2019.  No history of bleeding, hematochezia, melena. Found to have ron deficiency. Given a dose  of IV iron on this admission.  FOBT is negative.  On Protonix twice daily. Possible plan for eventual EGD and colonoscopy .  Normal vitamin B12, folic acid.  Hypomagnesemia: Supplemented with magnesium.  Continue to monitor.  Hyponatremia: Mild.  Continue to monitor.  Severe protein calorie malnutrition: On TPN.  Albumin of 1.5.  Diarrhea: Resolved with Imodium.  Nutrition Problem: Moderate Malnutrition Etiology: acute illness (necrotizing pancreatitis)      DVT prophylaxis:heparin [place] Code Status: Full Family Communication: Discussed with sister on phone on 09/02/2019. Status is: Inpatient  Remains inpatient appropriate because:IV treatments appropriate due to intensity of illness or inability to take PO   Dispo: The patient is from: Home              Anticipated d/c is to: Home              Anticipated d/c date is: > 3 days              Patient currently is not medically stable  to d/c.  Still on TPN.Anticipate prolonged hospitalization   Consultants: GI  Procedures:None  Antimicrobials:  Anti-infectives (From admission, onward)   Start     Dose/Rate Route Frequency Ordered Stop   08/27/19 2200  meropenem (MERREM) 1 g in sodium chloride 0.9 % 100 mL IVPB        1 g 200 mL/hr over 30 Minutes Intravenous  Every 8 hours 08/27/19 2149        Subjective:  Patient seen and examined the bedside this morning.  Hemodynamically stable.  Sitting on the chair.  Pain is okay but he wanted to increase the morphine to 3 mg every 3 hours.  He had some loose stools yesterday which have resolved with Imodium.  He wanted to advance  the diet from clear liquid but after discussion with GI , we are not advancing the diet for now.    Objective: Vitals:   09/01/19 2159 09/01/19 2345 09/02/19 0500 09/02/19 0616  BP: (!) 160/98   (!) 144/87  Pulse: 83   91  Resp: 18   16  Temp: 99.6 F (37.6 C) 98.3 F (36.8 C)  99.6 F (37.6 C)  TempSrc: Oral Oral  Oral  SpO2: 100%   98%  Weight:   74.1 kg   Height:        Intake/Output Summary (Last 24 hours) at 09/02/2019 0753 Last data filed at 09/02/2019 0510 Gross per 24 hour  Intake 3740.03 ml  Output 5350 ml  Net -1609.97 ml   Filed Weights   08/30/19 0500 08/30/19 1100 09/02/19 0500  Weight: 72.3 kg 75.5 kg 74.1 kg    Examination:   General exam: Chronically looking, weak but not in distress Respiratory system: Bilateral equal air entry, normal vesicular breath sounds, no wheezes or crackles  Cardiovascular system: S1 & S2 heard, RRR. No JVD, murmurs, rubs, gallops or clicks. Gastrointestinal system: Abdomen is nondistended, soft  and is tender mainly in the epigastric region. No organomegaly or masses felt. Normal bowel sounds heard. Central nervous system: Alert and oriented. No focal neurological deficits. Extremities: No edema, no clubbing ,no cyanosis Skin: No rashes, lesions or ulcers,no icterus ,no pallor   Data Reviewed: I have personally reviewed following labs and imaging studies  CBC: Recent Labs  Lab 08/27/19 1546 08/27/19 1546 08/27/19 2239 08/28/19 0417 08/28/19 0937 08/29/19 0556 09/01/19 0354  WBC 10.6*  --  10.6* 4.5  --  4.4 4.5  NEUTROABS  --   --   --   --   --  3.0 2.9  HGB 10.7*   < > 11.0* 7.2* 7.3* 8.0* 8.4*   HCT 32.0*   < > 33.3* 21.6* 21.9* 23.7* 25.8*  MCV 90.4  --  91.2 90.0  --  88.4 90.8  PLT 559*  --  507* 287  --  267 296   < > = values in this interval not displayed.   Basic Metabolic Panel: Recent Labs  Lab 08/29/19 0556 08/30/19 0338 08/31/19 0337 09/01/19 0354 09/02/19 0423  NA 128* 129* 134* 133* 132*  K 3.5 3.5 4.0 4.4 4.0  CL 95* 94* 100 99 97*  CO2 22 26 24 26 26   GLUCOSE 87 125* 124* 130* 139*  BUN 5* <5* <5* 5* 7  CREATININE 0.53* <0.30* 0.45* 0.42* 0.44*  CALCIUM 7.2* 7.3* 8.0* 8.2* 8.2*  MG 1.2* 1.6* 1.8 1.8 1.6*  PHOS 4.0 3.1 2.7 4.1 4.3   GFR: Estimated Creatinine Clearance: 114.5 mL/min (A) (by C-G formula  based on SCr of 0.44 mg/dL (L)). Liver Function Tests: Recent Labs  Lab 08/28/19 0417 08/29/19 0556 08/30/19 0338 09/01/19 0354 09/02/19 0423  AST 44* 31 64* 41 54*  ALT 35 31 48* 41 48*  ALKPHOS 100 95 190* 141* 118  BILITOT 1.1 1.2 0.9 0.3 0.6  PROT 5.9* 5.7* 5.4* 5.9* 6.0*  ALBUMIN 1.6* 1.6* 1.5* 1.6* 1.6*   Recent Labs  Lab 08/27/19 1546  LIPASE 27   No results for input(s): AMMONIA in the last 168 hours. Coagulation Profile: Recent Labs  Lab 08/27/19 2000  INR 1.2   Cardiac Enzymes: No results for input(s): CKTOTAL, CKMB, CKMBINDEX, TROPONINI in the last 168 hours. BNP (last 3 results) No results for input(s): PROBNP in the last 8760 hours. HbA1C: No results for input(s): HGBA1C in the last 72 hours. CBG: Recent Labs  Lab 09/01/19 0545 09/01/19 1212 09/01/19 1714 09/01/19 2332 09/02/19 0614  GLUCAP 129* 126* 110* 124* 129*   Lipid Profile: Recent Labs    09/01/19 0354  TRIG 138   Thyroid Function Tests: No results for input(s): TSH, T4TOTAL, FREET4, T3FREE, THYROIDAB in the last 72 hours. Anemia Panel: No results for input(s): VITAMINB12, FOLATE, FERRITIN, TIBC, IRON, RETICCTPCT in the last 72 hours. Sepsis Labs: Recent Labs  Lab 08/27/19 2000 08/27/19 2209  LATICACIDVEN 1.4 1.3    Recent Results (from  the past 240 hour(s))  SARS Coronavirus 2 by RT PCR (hospital order, performed in [facility] hospital lab) Nasopharyngeal Nasopharyngeal Swab     Status: None   Collection Time: 08/27/19  7:19 PM   Specimen: Nasopharyngeal Swab  Result Value Ref Range Status   SARS Coronavirus 2 NEGATIVE NEGATIVE Final    Comment: (NOTE) SARS-CoV-2 target nucleic acids are NOT DETECTED.  The SARS-CoV-2 RNA is generally detectable in upper and lower respiratory specimens during the acute phase of infection. The lowest concentration of SARS-CoV-2 viral copies this assay can detect is 250 copies / mL. A negative result does not preclude SARS-CoV-2 infection and should not be used as the sole basis for treatment or other patient management decisions.  A negative result may occur with improper specimen collection / handling, submission of specimen other than nasopharyngeal swab, presence of viral mutation(s) within the areas targeted by this assay, and inadequate number of viral copies (<250 copies / mL). A negative result must be combined with clinical observations, patient history, and epidemiological information.  Fact Sheet for Patients:   BoilerBrush.com.cyhttps://www.fda.gov/media/136312/download  Fact Sheet for Healthcare Providers: https://pope.com/https://www.fda.gov/media/136313/download  This test is not yet approved or  cleared by the [place] FDA and has been authorized for detection and/or diagnosis of SARS-CoV-2 by FDA under an Emergency Use Authorization (EUA).  This EUA will remain in effect (meaning this test can be used) for the duration of the COVID-19 declaration under Section 564(b)(1) of the Act, 21 U.S.C. section 360bbb-3(b)(1), unless the authorization is terminated or revoked sooner.  Performed at [facility], [address] [place], [place] [address]   Blood culture (routine single)     Status: None   Collection Time: 08/27/19  8:00 PM   Specimen: BLOOD  Result Value Ref Range  Status   Specimen Description   Final    BLOOD RIGHT ANTECUBITAL Performed at [facility], [address] [place], [place] [address]    Special Requests   Final    BOTTLES DRAWN AEROBIC AND ANAEROBIC Blood Culture adequate volume Performed at [facility], [address] [place], [place] [address]    Culture   Final    NO GROWTH 5 DAYS Performed at [facility], [address] [place], [place] [address]    Report Status 09/01/2019 FINAL  Final  Culture, blood (single)     Status: None (Preliminary result)   Collection Time: 08/27/19 10:09 PM   Specimen: BLOOD  Result Value Ref Range Status   Specimen Description   Final    BLOOD RIGHT ANTECUBITAL Performed at [facility], [address], [place], [place] [address]    Special Requests   Final    BOTTLES DRAWN AEROBIC AND ANAEROBIC Blood Culture results may not be optimal due to an inadequate volume of blood received in culture bottles Performed at [facility], [address], [place], [place] [address]    Culture   Final    NO GROWTH 4 DAYS Performed at [facility], [address], [place], [place] [address]    Report Status PENDING  Incomplete  Urine culture     Status: None   Collection Time: 08/28/19  4:17 AM   Specimen: In/Out Cath Urine  Result Value Ref Range Status   Specimen Description   Final    IN/OUT CATH URINE Performed at [facility], [address] [place], [place] [address]    Special Requests   Final    NONE Performed at [facility], [address] [place], [place] [address]    Culture   Final    NO GROWTH Performed at [facility], [address] [place], [place] [address]    Report Status 08/29/2019 FINAL  Final         Radiology Studies: No results found.      Scheduled Meds: . busPIRone  30 mg Oral QHS  . Chlorhexidine Gluconate Cloth  6 each  Topical Daily  . feeding supplement  1 Container Oral TID BM  . insulin aspart  0-9 Units Subcutaneous Q6H  . pantoprazole (PROTONIX) IV  40 mg Intravenous Q12H  . QUEtiapine  50 mg Oral QHS  . sodium chloride flush  10-40 mL Intracatheter Q12H   Continuous Infusions: . sodium chloride 75 mL/hr at 09/02/19 0300  . magnesium sulfate bolus IVPB    . meropenem (MERREM) IV 1 g (09/02/19 0510)  . TPN ADULT (ION) 95 mL/hr at 09/02/19 0300     LOS: 6 days    Time spent: 35 mins,More than 50% of that time was spent in counseling and/or coordination of care.      Burnadette Pop, MD [facility] P8/31/2021, 7:53 AM

## 2019-09-02 NOTE — Consult Note (Signed)
[facility] Consult Note  Gary Atkinson [birth year]  [id number].    Requesting MD: Russella Dar Chief Complaint/Reason for Consult: Necrotizing pancreatitis  HPI:  Patient is a 49 year old male who presented to [facility] 8/25 at direction of GI for pancreatitis and worsening anorexia, abdominal pain, diarrhea, and vomiting. Patient was hospitalized from 07/24/19 to 08/06/19 with acute EtOH pancreatitis and noted to have necrosis then as well. Last hospitalization also complicated by acute alcohol withdrawal which required precedex gtt in the ICU. Work up in the ED 8/25 included CT which showed evolution of pancreatic necrosis with some further organization of pancreatic collections, no overt pseudocyst mentioned however. Patient initially had some mild leukocytosis which has now normalized on IV meropenem. His LFTs have been elevated but not significantly. Lipase has been normal. Patient currently reports he is still having abdominal pain that is most severe in LUQ. He is still not taking in much orally. Still reports foul-smelling diarrhea. Patient reports he has significant economic stresses and he wants to get better so he can get back to working.  PMH otherwise significant for HTN, HLD, GERD, CAD with Hx of MI s/p stents, Hx of alcohol abuse. He reports he has been sober since last admission. He also reports that prior to initial admission he was not a daily drinker but would drink heavily when he was consuming alcohol. Past abdominal surgery includes appendectomy. NKDA. No blood thinning medications.   ROS: Review of Systems  Constitutional: Positive for malaise/fatigue. Negative for chills and fever.  Respiratory: Negative for shortness of breath and wheezing.   Cardiovascular: Negative for chest pain and palpitations.  Gastrointestinal: Positive for abdominal pain, diarrhea (foul smelling), nausea and vomiting. Negative for blood in stool, constipation and melena.  Genitourinary: Negative for  dysuria, frequency and urgency.  All other systems reviewed and are negative.   Family History  Problem Relation Age of Onset  . Liver disease Father   . Alcoholism Father     Past Medical History:  Diagnosis Date  . Acute pancreatitis   . Allergic rhinitis   . Anxiety   . Asthma   . CAD (coronary artery disease)   . Depression   . GERD (gastroesophageal reflux disease)   . Hyperlipemia   . Hypertension   . MI (myocardial infarction) (HCC)   . Pneumonia     Past Surgical History:  Procedure Laterality Date  . APPENDECTOMY    . CARDIAC CATHETERIZATION     with 2 stents    Social History:  reports that he quit smoking about 19 years ago. His smoking use included cigarettes. He has never used smokeless tobacco. He reports previous alcohol use. He reports that he does not use drugs.  Allergies: No Known Allergies  Medications Prior to Admission  Medication Sig Dispense Refill  . acetaminophen (TYLENOL) 325 MG tablet Take 650 mg by mouth every 6 (six) hours as needed for mild pain or headache.    . Ascorbic Acid (VITAMIN C PO) Take 1 tablet by mouth every evening.     Marland Kitchen aspirin 81 MG tablet Take 81 mg by mouth at bedtime.    . busPIRone (BUSPAR) 10 MG tablet Take 30 mg by mouth at bedtime.     . clonazePAM (KLONOPIN) 1 MG disintegrating tablet Take 1 mg by mouth at bedtime.     Marland Kitchen dexlansoprazole (DEXILANT) 60 MG capsule Take 1 capsule (60 mg total) by mouth daily. (Patient taking differently: Take 60 mg by mouth every evening. )  30 capsule 1  . lisinopril (ZESTRIL) 10 MG tablet Take 1 tablet (10 mg total) by mouth daily. (Patient taking differently: Take 10 mg by mouth every evening. ) 30 tablet 0  . propranolol (INDERAL) 40 MG tablet Take 0.5 tablets (20 mg total) by mouth daily. 15 tablet 0  . QUEtiapine (SEROQUEL) 50 MG tablet Take 1 tablet (50 mg total) by mouth at bedtime. 30 tablet 0  . thiamine 100 MG tablet Take 1 tablet (100 mg total) by mouth daily. (Patient taking  differently: Take 100 mg by mouth every evening. ) 30 tablet 0  . topiramate (TOPAMAX) 25 MG tablet Take 25 mg by mouth every evening.     . nitroGLYCERIN (NITROSTAT) 0.4 MG SL tablet Place 0.4 mg under the tongue as needed for chest pain.       Blood pressure 121/87, pulse (!) 103, temperature 99 F (37.2 C), temperature source Oral, resp. rate 20, height 5\' 11"  (1.803 m), weight 74.1 kg, SpO2 100 %. Physical Exam:  General: pleasant, WD, ill appearing male who is sitting up in chair HEENT:  Sclera are anicteric. Ears and nose without any masses or lesions.  Mouth is pink and moist Heart: regular, rate, and rhythm. Palpable radial and pedal pulses bilaterally Lungs: CTAB, no wheezes, rhonchi, or rales noted.  Respiratory effort nonlabored Abd: soft, mildly ttp in LUQ with some voluntary guarding, no peritonitis, mildly distended, +BS MS: all 4 extremities are symmetrical with no cyanosis, clubbing, or edema. Skin: warm and dry with no masses, lesions, or rashes Neuro: Cranial nerves 2-12 grossly intact, sensation grossly intact throughout Psych: A&Ox3 with an appropriate affect.   Results for orders placed or performed during the hospital encounter of 08/27/19 (from the past 48 hour(s))  Glucose, capillary     Status: Abnormal   Collection Time: 08/31/19  4:59 PM  Result Value Ref Range   Glucose-Capillary 106 (H) 70 - 99 mg/dL    Comment: Glucose reference range applies only to samples taken after fasting for at least 8 hours.  Glucose, capillary     Status: Abnormal   Collection Time: 08/31/19 11:38 PM  Result Value Ref Range   Glucose-Capillary 117 (H) 70 - 99 mg/dL    Comment: Glucose reference range applies only to samples taken after fasting for at least 8 hours.   Comment 1 Notify RN    Comment 2 Document in Chart   Comprehensive metabolic panel     Status: Abnormal   Collection Time: 09/01/19  3:54 AM  Result Value Ref Range   Sodium 133 (L) 135 - 145 mmol/L   Potassium  4.4 3.5 - 5.1 mmol/L   Chloride 99 98 - 111 mmol/L   CO2 26 22 - 32 mmol/L   Glucose, Bld 130 (H) 70 - 99 mg/dL    Comment: Glucose reference range applies only to samples taken after fasting for at least 8 hours.   BUN 5 (L) 6 - 20 mg/dL   Creatinine, Ser 09/03/19 (L) 0.61 - 1.24 mg/dL   Calcium 8.2 (L) 8.9 - 10.3 mg/dL   Total Protein 5.9 (L) 6.5 - 8.1 g/dL   Albumin 1.6 (L) 3.5 - 5.0 g/dL   AST 41 15 - 41 U/L   ALT 41 0 - 44 U/L   Alkaline Phosphatase 141 (H) 38 - 126 U/L   Total Bilirubin 0.3 0.3 - 1.2 mg/dL   GFR calc non Af Amer >60 >60 mL/min   GFR calc Af Amer >60 >  60 mL/min   Anion gap 8 5 - 15    Comment: Performed at [facility], [address], [place], [place] [address]  Magnesium     Status: None   Collection Time: 09/01/19  3:54 AM  Result Value Ref Range   Magnesium 1.8 1.7 - 2.4 mg/dL    Comment: Performed at [facility], [address] [place], [place] [address]  Phosphorus     Status: None   Collection Time: 09/01/19  3:54 AM  Result Value Ref Range   Phosphorus 4.1 2.5 - 4.6 mg/dL    Comment: Performed at [facility], [address] [place], [place] [address]  CBC     Status: Abnormal   Collection Time: 09/01/19  3:54 AM  Result Value Ref Range   WBC 4.5 4.0 - 10.5 K/uL   RBC 2.84 (L) 4.22 - 5.81 MIL/uL   Hemoglobin 8.4 (L) 13.0 - 17.0 g/dL   HCT 01.0 (L) 39 - 52 %   MCV 90.8 80.0 - 100.0 fL   MCH 29.6 26.0 - 34.0 pg   MCHC 32.6 30.0 - 36.0 g/dL   RDW 93.2 35.5 - 73.2 %   Platelets 296 150 - 400 K/uL   nRBC 0.0 0.0 - 0.2 %    Comment: Performed at [facility], [address], [place], [place] [address]  Differential     Status: None   Collection Time: 09/01/19  3:54 AM  Result Value Ref Range   Neutrophils Relative % 64 %   Neutro Abs 2.9 1.7 - 7.7 K/uL   Lymphocytes Relative 22 %   Lymphs Abs 1.0 0.7 - 4.0 K/uL   Monocytes Relative 11 %   Monocytes Absolute 0.5 0 - 1  K/uL   Eosinophils Relative 2 %   Eosinophils Absolute 0.1 0 - 0 K/uL   Basophils Relative 0 %   Basophils Absolute 0.0 0 - 0 K/uL   Immature Granulocytes 1 %   Abs Immature Granulocytes 0.03 0.00 - 0.07 K/uL    Comment: Performed at [facility], [address] [place], [place] [address]  Triglycerides     Status: None   Collection Time: 09/01/19  3:54 AM  Result Value Ref Range   Triglycerides 138 <150 mg/dL    Comment: Performed at [facility], [address] [place], [place] [address]  Prealbumin     Status: Abnormal   Collection Time: 09/01/19  3:54 AM  Result Value Ref Range   Prealbumin <5 (L) 18 - 38 mg/dL    Comment: Performed at [facility], [address] [place], [place] [address]  Glucose, capillary     Status: Abnormal   Collection Time: 09/01/19  5:45 AM  Result Value Ref Range   Glucose-Capillary 129 (H) 70 - 99 mg/dL    Comment: Glucose reference range applies only to samples taken after fasting for at least 8 hours.  Glucose, capillary     Status: Abnormal   Collection Time: 09/01/19 12:12 PM  Result Value Ref Range   Glucose-Capillary 126 (H) 70 - 99 mg/dL    Comment: Glucose reference range applies only to samples taken after fasting for at least 8 hours.  Glucose, capillary     Status: Abnormal   Collection Time: 09/01/19  5:14 PM  Result Value Ref Range   Glucose-Capillary 110 (H) 70 - 99 mg/dL    Comment: Glucose reference range applies  only to samples taken after fasting for at least 8 hours.  Glucose, capillary     Status: Abnormal   Collection Time: 09/01/19 11:32 PM  Result Value Ref Range   Glucose-Capillary 124 (H) 70 - 99 mg/dL    Comment: Glucose reference range applies only to samples taken after fasting for at least 8 hours.  Comprehensive metabolic panel     Status: Abnormal   Collection Time: 09/02/19  4:23 AM  Result Value Ref Range   Sodium 132 (L) 135 - 145 mmol/L   Potassium  4.0 3.5 - 5.1 mmol/L   Chloride 97 (L) 98 - 111 mmol/L   CO2 26 22 - 32 mmol/L   Glucose, Bld 139 (H) 70 - 99 mg/dL    Comment: Glucose reference range applies only to samples taken after fasting for at least 8 hours.   BUN 7 6 - 20 mg/dL   Creatinine, Ser 7.420.44 (L) 0.61 - 1.24 mg/dL   Calcium 8.2 (L) 8.9 - 10.3 mg/dL   Total Protein 6.0 (L) 6.5 - 8.1 g/dL   Albumin 1.6 (L) 3.5 - 5.0 g/dL   AST 54 (H) 15 - 41 U/L   ALT 48 (H) 0 - 44 U/L   Alkaline Phosphatase 118 38 - 126 U/L   Total Bilirubin 0.6 0.3 - 1.2 mg/dL   GFR calc non Af Amer >60 >60 mL/min   GFR calc Af Amer >60 >60 mL/min   Anion gap 9 5 - 15    Comment: Performed at [facility], [address] [place], [place] [address]  Magnesium     Status: Abnormal   Collection Time: 09/02/19  4:23 AM  Result Value Ref Range   Magnesium 1.6 (L) 1.7 - 2.4 mg/dL    Comment: Performed at [facility], [address] [place], [place] [address]  Phosphorus     Status: None   Collection Time: 09/02/19  4:23 AM  Result Value Ref Range   Phosphorus 4.3 2.5 - 4.6 mg/dL    Comment: Performed at [facility], [address] [place], [place] [id number]  Glucose, capillary     Status: Abnormal   Collection Time: 09/02/19  6:14 AM  Result Value Ref Range   Glucose-Capillary 129 (H) 70 - 99 mg/dL    Comment: Glucose reference range applies only to samples taken after fasting for at least 8 hours.  Glucose, capillary     Status: Abnormal   Collection Time: 09/02/19 11:47 AM  Result Value Ref Range   Glucose-Capillary 135 (H) 70 - 99 mg/dL    Comment: Glucose reference range applies only to samples taken after fasting for at least 8 hours.   Comment 1 Notify RN    Comment 2 Document in Chart    No results found.    Assessment/Plan HTN HLD GERD CAD with Hx of MI s/p stents Hx of alcohol abuse  Acute pancreatitis with pancreatic necrosis - last CT was 8/25 and did not show  any drainable collection like a pseudocyst - patient clinically has shown some slight improvement and able to tolerate some liquids - clinically patient is not septic currently - would not recommend any pancreatic debridement currently as patient is improving some - if patient worsens, first step should be to repeat imaging    Juliet RudeKelly R Aylissa Heinemann, [facility] 09/02/2019, 12:58 PM Please see Amion for pager number during day hours 7:00am-4:30pm

## 2019-09-02 NOTE — Progress Notes (Signed)
Offered pain medication, pt stated he did not want any at this time

## 2019-09-02 NOTE — Progress Notes (Signed)
PHARMACY - TOTAL PARENTERAL NUTRITION CONSULT NOTE   Indication: Severe pancreatitis  Patient Measurements: Height: 5' 11"  (180.3 cm) Weight: 74.1 kg (163 lb 5.8 oz) IBW/kg (Calculated) : 75.3   Body mass index is 22.78 kg/m.    Assessment:  34 y/oM with PMH significant for CAD, recent admission for necrotizing pancreatitis, discharged from the hospital on 08/06/19. Patient has had worsening epigastric pain and difficulty tolerating PO. Pharmacy consulted to initiate TPN.   Glucose / Insulin: No history DM. CBGs 110-129 (goal 100-150). 0 units SSI required over last 24 hours.   Electrolytes: Na+ still slightly low/decreased. Mag low at 1.6. Phos remains WNL, but trending up. Cl- slightly low at 97. Others, including Corrected Ca, WNL.  Renal: SCr low LFTs / TGs: AST/ALT slightly elevated 54/48. Tbili WNL. Alk Phos now WNL. TG 82 (8/27), 138 (8/30) Prealbumin / albumin: < 5/1.6 Intake / Output; MIVF: I/O 3740/5350, MIVF: NS at 75 ml/hr GI Imaging: Abd CT 8/25: pancreatic necrosis > 50%, gas noted > suspect infection, no overt cholecystitis, pelvic ascites decreased, hepatic steatosis Surgeries / Procedures:   Central access: 8/26 double lumen PICC placed in PM TPN start date: 8/27  Nutritional Goals: RD recommendations (8/27) Kcal 2200-2400, Protein 110-125 grams/day  TPN at goal rate of 95 ml/hr delivers 2225 Kcal, 123 grams protein  Current Nutrition: clear liquid diet, TPN, Boost/Resource Breeze TID (refusing most)  Plan:  Magnesium Sulfate 2g IV x 1 now  At 1800, continue TPN at goal rate of 95 mL/hr Electrolytes in TPN: 131mq/L of Na, 561m/L of K, 37m2mL of Ca, 14m106m of Mag, and 14mm31m of Phos. Cl:Ac ratio 2:1 Add standard MVI and trace elements to TPN Continue sensitive SSI q6h  MIVF per MD: NS at 737ml/61mMonitor TPN labs on Mon/Thurs CMET, Mag, Phos in AM   Gary Atkinson, BCPS Clinical Pharmacist  09/02/2019,7:56 AM

## 2019-09-02 NOTE — Progress Notes (Addendum)
Patient ID: Gary Atkinson, male   DOB: Oct 26, 1970, 49 y.o.   MRN: [id number]    Progress Note   Subjective   Day # 5  CC: necrotizing alcohol induced pancreatitis  Meropenem IV TPN  Labs-creatinine 0.44/albumin 1.6/AST 54/ALT 48 MG 1.6  Up in chair, taking clear liquids.  Says his pain is 7 out of 10 however he appears comfortable.  He did not want to stay on Dilaudid, secondary to issues with mentation.  Has switched over to morphine. Patient sister was on the phone while we were in the room.  He has been having ongoing problems with severe reflux, on twice daily Protonix here, had been on Dexilant at home.  This is a long-term issue. Also continues to have diarrhea which she had had at home prior to readmission.  He has not had any diarrhea this morning.  Has Imodium ordered as needed.   Objective   Vital signs in last 24 hours: Temp:  [98.3 F (36.8 C)-99.6 F (37.6 C)] 99.6 F (37.6 C) (08/31 0616) Pulse Rate:  [83-91] 91 (08/31 0616) Resp:  [16-23] 16 (08/31 0616) BP: (140-160)/(87-98) 144/87 (08/31 0616) SpO2:  [98 %-100 %] 98 % (08/31 0616) Weight:  [74.1 kg] 74.1 kg (08/31 0500) Last BM Date: 09/01/19 General:    Older white male in NAD, ill-appearing Heart:  Regular rate and rhythm; no murmurs Lungs: Respirations even and unlabored, lungs CTA bilaterally Abdomen:  Soft, tender across the upper abdomen, no rebound. Normal bowel sounds. Extremities:  Without edema. Neurologic:  Alert and oriented,  grossly normal neurologically. Psych:  Cooperative. Normal mood and affect.  Intake/Output from previous day: 08/30 0701 - 08/31 0700 In: 3740 [P.O.:600; I.V.:2940; IV Piggyback:200] Out: 5350 [Urine:5350] Intake/Output this shift: No intake/output data recorded.  Lab Results: Recent Labs    09/01/19 0354  WBC 4.5  HGB 8.4*  HCT 25.8*  PLT 296   BMET Recent Labs    08/31/19 0337 09/01/19 0354 09/02/19 0423  NA 134* 133* 132*  K 4.0 4.4 4.0  CL 100 99 97*   CO2 24 26 26   GLUCOSE 124* 130* 139*  BUN <5* 5* 7  CREATININE 0.45* 0.42* 0.44*  CALCIUM 8.0* 8.2* 8.2*   LFT Recent Labs    09/02/19 0423  PROT 6.0*  ALBUMIN 1.6*  AST 54*  ALT 48*  ALKPHOS 118  BILITOT 0.6     Assessment / Plan:    #98 49 year old white male with severe EtOH induced pancreatitis, necrotizing and felt to be infected, given changes on most recent CT.  CT on  admission shows necrosis of greater than 50% of the pancreas.  There is a 3.1 x 1.9 cm collection in the anterior pancreas with a small amount of gas compatible with walled off necrosis involving the pancreatic head and the pancreaticoduodenal groove, and walled off necrosis in the tail of the pancreas measuring 5.7 x 5 cm, previously 6.7 x 7.2.  In addition there is further necrosis of the gland. 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  Parameters improving on meropenem. He is clinically stable, but remains quite ill.  He will need eventual necrosectomy, or surgical pancreatic debridement.  Will discuss with Dr. 44, and ask him to review imaging for appropriateness of endoscopic necrosectomy, and timing.  This was discussed briefly with the patient today, and also mention possibility of surgery.  Will ask general surgery to see. Given degree of ongoing pain, and requirement for narcotics, would  not advance diet #2 protein calorie malnutrition-continue TPN #3 anemia multifactorial-continue to trend. He did receive IV Fe Feraheme 08/29/2019  #4 Chronic GERD-inadequately controlled on twice daily Protonix.  Unable to prescribe Dexilant at hospital.  Will add Carafate suspension 4 times daily.  Also help if patient can be out of bed during the day and upright. Expect that GERD has worsened given severity of pancreatitis, poor gastric emptying.  #5 diarrhea-this may be secondary to antibiotics, rule out pancreatic insufficiency, less likely infectious but will rule out.   Stool studies ordered today and fecal elastase, okay to continue Imodium as needed   Addendum-called by floor nurse regarding stool studies.  Apparently per ID/hospital protocol patient does not meet criteria for C. difficile quick screen.  Have canceled C. difficile and GI path panel, have asked nursing to record number of bowel movements per shift.  Principal Problem:   Pancreatitis, necrotizing Active Problems:   CAD (coronary artery disease), native coronary artery   Benign essential HTN   Pancreatic necrosis   Acute pancreatitis   Malnutrition of moderate degree    LOS: 6 days   Larwence Tu PA-C 09/02/2019, 8:36 AM      Attending Physician Note   I have taken an interval history, reviewed the chart and examined the patient. I agree with the Advanced Practitioner's note, impression and recommendations.   We had a long discussion mgmt with the patient and his sister (by phone) addressing his necrotizing pancreatitis, GERD, diarrhea and nutrition. Options being explored for mgmt of walled of pancreatic necrosis. Have asked Dr. Meridee Score to review for feasibility of endoscopy necrosectomy. Surgical consult for mgmt options.  Check stool studies and fecal elastase. Imodium prn.  Change GERD medication from Protonix which as not been effective. OOB to chair, ambulate and elevate HOB > 30 degrees at all times.   Claudette Head, MD [facility]

## 2019-09-03 DIAGNOSIS — K8689 Other specified diseases of pancreas: Secondary | ICD-10-CM

## 2019-09-03 DIAGNOSIS — I10 Essential (primary) hypertension: Secondary | ICD-10-CM

## 2019-09-03 DIAGNOSIS — I2510 Atherosclerotic heart disease of native coronary artery without angina pectoris: Secondary | ICD-10-CM

## 2019-09-03 LAB — COMPREHENSIVE METABOLIC PANEL
ALT: 94 U/L — ABNORMAL HIGH (ref 0–44)
AST: 130 U/L — ABNORMAL HIGH (ref 15–41)
Albumin: 1.6 g/dL — ABNORMAL LOW (ref 3.5–5.0)
Alkaline Phosphatase: 105 U/L (ref 38–126)
Anion gap: 6 (ref 5–15)
BUN: 8 mg/dL (ref 6–20)
CO2: 26 mmol/L (ref 22–32)
Calcium: 8 mg/dL — ABNORMAL LOW (ref 8.9–10.3)
Chloride: 100 mmol/L (ref 98–111)
Creatinine, Ser: 0.39 mg/dL — ABNORMAL LOW (ref 0.61–1.24)
GFR calc Af Amer: >60 mL/min (ref >60)
GFR calc non Af Amer: >60 mL/min (ref >60)
Glucose, Bld: 149 mg/dL — ABNORMAL HIGH (ref 70–99)
Potassium: 4.2 mmol/L (ref 3.5–5.1)
Sodium: 132 mmol/L — ABNORMAL LOW (ref 135–145)
Total Bilirubin: 0.4 mg/dL (ref 0.3–1.2)
Total Protein: 6.2 g/dL — ABNORMAL LOW (ref 6.5–8.1)

## 2019-09-03 LAB — C-REACTIVE PROTEIN: CRP: 4 mg/dL — ABNORMAL HIGH (ref <1.0)

## 2019-09-03 LAB — GLUCOSE, CAPILLARY
Glucose-Capillary: 108 mg/dL — ABNORMAL HIGH (ref 70–99)
Glucose-Capillary: 117 mg/dL — ABNORMAL HIGH (ref 70–99)
Glucose-Capillary: 123 mg/dL — ABNORMAL HIGH (ref 70–99)
Glucose-Capillary: 126 mg/dL — ABNORMAL HIGH (ref 70–99)

## 2019-09-03 LAB — MAGNESIUM: Magnesium: 1.8 mg/dL (ref 1.7–2.4)

## 2019-09-03 LAB — PHOSPHORUS: Phosphorus: 3.9 mg/dL (ref 2.5–4.6)

## 2019-09-03 MED ORDER — TRAVASOL 10 % IV SOLN
INTRAVENOUS | Status: AC
  Filled 2019-09-03: qty 1231.2

## 2019-09-03 NOTE — Progress Notes (Addendum)
Patient ID: Gary Atkinson, male   DOB: 02-10-1970, 49 y.o.   MRN: [id number]    Progress Note   Subjective   Day # 7  CC; necrotizing pancreatitis  Meropenem IV  Stool studies canceled yesterday except fecal elastase-pending  Creatinine 0.39 T bili 0.4/alk phos 105/ALT 94/AST 130  Patient was able to be up in a chair for about 3-1/2 hours yesterday, also able to ambulate in the hall x2 with assistance.  He is asking about bathing and possibly advancing diet.  He has not been using any narcotics during the day over the past couple of days though says his pain is still about a 6-7 out of 10.  No nausea or vomiting Only 1 loose bowel movement over the past 24 hours Reflux better with addition of Dexilant and Carafate   Objective   Vital signs in last 24 hours: Temp:  [98.1 F (36.7 C)-99.1 F (37.3 C)] 98.6 F (37 C) (09/01 0630) Pulse Rate:  [87-103] 95 (09/01 0630) Resp:  [14-20] 14 (09/01 0630) BP: (121-150)/(83-92) 144/83 (09/01 0630) SpO2:  [98 %-100 %] 98 % (09/01 0630) Last BM Date: 09/02/19 General:    white male in NAD, Heart:  Regular rate and rhythm; no murmurs Lungs: Respirations even and unlabored, lungs CTA bilaterally Abdomen:  Soft, tender across the upper abdomen no rebound nondistended. Normal bowel sounds. Extremities:  Without edema. Neurologic:  Alert and oriented,  grossly normal neurologically. Psych:  Cooperative. Normal mood and affect.  Intake/Output from previous day: 08/31 0701 - 09/01 0700 In: 4139.3 [P.O.:840; I.V.:2997.2; IV Piggyback:302.1] Out: 3225 [Urine:3225] Intake/Output this shift: Total I/O In: -  Out: 250 [Urine:250]  Lab Results: Recent Labs    09/01/19 0354  WBC 4.5  HGB 8.4*  HCT 25.8*  PLT 296   BMET Recent Labs    09/01/19 0354 09/02/19 0423 09/03/19 0345  NA 133* 132* 132*  K 4.4 4.0 4.2  CL 99 97* 100  CO2 _0 GLUCOSE 130* 139* 149*  BUN 5* 7 8  CREATININE 0.42* 0.44* 0.39*  CALCIUM 8.2* 8.2* 8.0*    LFT Recent Labs    09/03/19 0345  PROT 6.2*  ALBUMIN 1.6*  AST 130*  ALT 94*  ALKPHOS 105  BILITOT 0.4   PT/INR No results for input(s): LABPROT, INR in the last 72 hours.      Assessment / Plan:    #22 49 year old white male with recent severe necrotizing EtOH induced pancreatitis with prolonged hospital stay, complicated by EtOH withdrawal, who was readmitted 1 week ago, after developing fever at home to 102 and with complaints of generalized weakness, abdominal pain, nausea and weight loss.  Found on repeat imaging to have 2 areas of walled off pancreatic necrosis, and further necrosis of the gland involving at least 50% of the pancreas.  There are small locules of gas in the pancreatic head concerning for infected necrosis.  He has improved over the past week, placed on IV meropenem on admission.  He has been afebrile and WBC has normalized.  LFTs stable with AST 130/ALT 94  PICC line placed 8/26-on TPN and tolerating clears.  Surgery consulted yesterday, they do not feel that any surgical intervention/necrosectomy indicated Dr. Lind Covert biliary team has reviewed imaging, and does feel the patient will be a candidate for endoscopic necrosectomy in the coming weeks. We will plan repeat imaging at 2-week interval, as long as he remains clinically stable and improving.  Timing of endoscopic intervention could be  determined after repeat CT.   will trial low-fat full liquids today Plan meropenem at least 10 days  #2 diarrhea-mild, improved If fecal elastase consistent with pancreatic insufficiency will add enzyme supplement  #3 refractory GERD-added Dexilant yesterday which she had been on at home, and Carafate.  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 I personally interviewed and examined the patient.  Colbe is stable, and making some slow clinical gains.  He is able to tolerate a small amount of full liquid diet today, and I would not push beyond that at this point.  Regardless of the results of fecal elastase, if we do advance his diet further in the coming days I would just give him pancreatic enzyme supplements.  Advanced endoscopist to evaluate him in the near future for possible endoscopic necrosectomy.  Continue TPN and meropenem in the meantime as he is clinically improving, albeit slowly.  I expect he will remain hospitalized for least into early next week.  Total time 25 minutes.  Nelida Meuse III Office: [phone]

## 2019-09-03 NOTE — Progress Notes (Signed)
PROGRESS NOTE  Gary Atkinson  ZOX:[id number] DOB: [birth year] DOA: 08/27/2019 PCP: [facility]   Brief Narrative: Patient is a 49 year old male with history of coronary artery disease status post stenting, recently admitted for acute necrotizing pancreatitis and was discharged on 08/06/2019 presented with persistent abdominal pain.  After discharge, patient has not been able to tolerate the food.  He was complaining of worsening pain on the epigastric area, was vomiting and also diarrhea.    CT abdomen/pelvis done in the emergency department showed acute on chronic pancreatitis with possible infection/organized necrotic tissue with possible obstruction of the splenic vein.  GI consulted.  Started on IV antibiotics, IV fluids.  Started on TPN because of expected prolonged decreased oral intake.  Assessment & Plan: Principal Problem:   Pancreatitis, necrotizing Active Problems:   CAD (coronary artery disease), native coronary artery   Benign essential HTN   Pancreatic necrosis   Acute pancreatitis   Malnutrition of moderate degree  Acute, recurrent alcohol-induced necrotizing pancreatitis: Recently admitted for the management of alcoholic pancreatitis and was discharged on 08/06/2019. CT imaging concerning for walled off necrosis, pancreatic collections, occlusion of the splenic vein..  - Continue meropenem x10 days, IV fluids, (PICC placed 8/26) TPN, diet advanced to full liquids, but not likely to progress beyond that at this time. Appreciate GI consult, plans to pursue repeat CT at 2 weeks and possible endoscopic necrosectomy.  - Surgery consulted, though no open necrosectomy/debridement currently indicated.  - Continue IV pain medications, antiemetics - Once advancing diet to solids, would supplement with creon.   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  - Continue ASA   HTN: BP stable. - Continue as needed medication for hypertension.  History of alcohol abuse: No evidence of withdrawal during this admission.  - Continue thiamine, folic acid.  - Cessation recommended   Iron deficiency /normocytic anemia: Hemoglobin dropped in the range of 8.  He has been transfused with a unit of PRBC on 08/28/2019.  No history of bleeding, hematochezia, melena. FOBT is negative.  - s/p IV iron - Continue PPI BID.   Hypomagnesemia: Supplemented with magnesium.  - Continue to monitor.  Hyponatremia: Mild.  - Continue to monitor.  Severe protein calorie malnutrition:  - Continue TPN.  Albumin of 1.5.  Diarrhea: ?Exocrine deficiency.  - Continue prn iImodium.  DVT prophylaxis: Heparin Code Status: Full Family Communication: None at bedside Disposition Plan:   Status is: Inpatient  Remains inpatient appropriate because:Persistent severe electrolyte disturbances and IV treatments appropriate due to intensity of illness or inability to take PO   Dispo: The patient is from: Home              Anticipated d/c is to: Home              Anticipated d/c date is: > 3 days              Patient currently is not medically stable to d/c.  Consultants:   GI  General surgery  Procedures:   PICC insertion  Antimicrobials:  Meropenem   Subjective: Pain is in upper abdomen radiating to the back, controlled, not provoked by liquid diet. Having loose stools.   Objective: Vitals:   09/02/19 1434 09/02/19 2112 09/03/19 0630 09/03/19 1217  BP: 132/84 (!) 150/92 Marland Kitchen(!)  144/83 (!) 147/90  Pulse: 95 87 95 89  Resp: 16 16 14 16   Temp: 99.1 F (37.3 C) 98.1 F (36.7 C) 98.6 F (37 C) 99.2 F (37.3 C)  TempSrc:  Oral Oral Oral  SpO2: 98% 100% 98% 100%  Weight:      Height:        Intake/Output Summary (Last 24 hours) at 09/03/2019 1924 Last data  filed at 09/03/2019 1700 Gross per 24 hour  Intake 6426.27 ml  Output 2525 ml  Net 3901.27 ml   Filed Weights   08/30/19 0500 08/30/19 1100 09/02/19 0500  Weight: 72.3 kg 75.5 kg 74.1 kg    Gen: 49 y.o. male in no distress Pulm: Non-labored breathing room air. Clear to auscultation bilaterally.  CV: Regular rate and rhythm. No murmur, rub, or gallop. No JVD, no pedal edema. GI: Abdomen soft, tender most in epigastrium, non-distended, with normoactive bowel sounds. No organomegaly or masses felt. Ext: Warm, no deformities Skin: No rashes, lesions or ulcers Neuro: Alert and oriented. No focal neurological deficits. Psych: Judgement and insight appear normal. Mood & affect appropriate.   Data Reviewed: I have personally reviewed following labs and imaging studies  CBC: Recent Labs  Lab 08/27/19 2239 08/28/19 0417 08/28/19 0937 08/29/19 0556 09/01/19 0354  WBC 10.6* 4.5  --  4.4 4.5  NEUTROABS  --   --   --  3.0 2.9  HGB 11.0* 7.2* 7.3* 8.0* 8.4*  HCT 33.3* 21.6* 21.9* 23.7* 25.8*  MCV 91.2 90.0  --  88.4 90.8  PLT 507* 287  --  267 296   Basic Metabolic Panel: Recent Labs  Lab 08/30/19 0338 08/31/19 0337 09/01/19 0354 09/02/19 0423 09/03/19 0345  NA 129* 134* 133* 132* 132*  K 3.5 4.0 4.4 4.0 4.2  CL 94* 100 99 97* 100  CO2 26 24 26 26 26   GLUCOSE 125* 124* 130* 139* 149*  BUN <5* <5* 5* 7 8  CREATININE <0.30* 0.45* 0.42* 0.44* 0.39*  CALCIUM 7.3* 8.0* 8.2* 8.2* 8.0*  MG 1.6* 1.8 1.8 1.6* 1.8  PHOS 3.1 2.7 4.1 4.3 3.9   GFR: Estimated Creatinine Clearance: 114.5 mL/min (A) (by C-G formula based on SCr of 0.39 mg/dL (L)). Liver Function Tests: Recent Labs  Lab 08/29/19 0556 08/30/19 0338 09/01/19 0354 09/02/19 0423 09/03/19 0345  AST 31 64* 41 54* 130*  ALT 31 48* 41 48* 94*  ALKPHOS 95 190* 141* 118 105  BILITOT 1.2 0.9 0.3 0.6 0.4  PROT 5.7* 5.4* 5.9* 6.0* 6.2*  ALBUMIN 1.6* 1.5* 1.6* 1.6* 1.6*   No results for input(s): LIPASE, AMYLASE in the last  168 hours. No results for input(s): AMMONIA in the last 168 hours. Coagulation Profile: Recent Labs  Lab 08/27/19 2000  INR 1.2   Cardiac Enzymes: No results for input(s): CKTOTAL, CKMB, CKMBINDEX, TROPONINI in the last 168 hours. BNP (last 3 results) No results for input(s): PROBNP in the last 8760 hours. HbA1C: No results for input(s): HGBA1C in the last 72 hours. CBG: Recent Labs  Lab 09/02/19 1750 09/03/19 0008 09/03/19 0606 09/03/19 1151 09/03/19 1700  GLUCAP 117* 123* 126* 117* 108*   Lipid Profile: Recent Labs    09/01/19 0354  TRIG 138   Thyroid Function Tests: No results for input(s): TSH, T4TOTAL, FREET4, T3FREE, THYROIDAB in the last 72 hours. Anemia Panel: No results for input(s): VITAMINB12, FOLATE, FERRITIN, TIBC, IRON, RETICCTPCT in the last 72 hours. Urine analysis:    Component Value Date/Time  COLORURINE YELLOW 08/28/2019 0417   APPEARANCEUR CLEAR 08/28/2019 0417   LABSPEC 1.005 08/28/2019 0417   PHURINE 5.0 08/28/2019 0417   GLUCOSEU NEGATIVE 08/28/2019 0417   HGBUR NEGATIVE 08/28/2019 0417   BILIRUBINUR NEGATIVE 08/28/2019 0417   KETONESUR 5 (A) 08/28/2019 0417   PROTEINUR NEGATIVE 08/28/2019 0417   NITRITE NEGATIVE 08/28/2019 0417   LEUKOCYTESUR NEGATIVE 08/28/2019 0417   Recent Results (from the past 240 hour(s))  SARS Coronavirus 2 by RT PCR (hospital order, performed in [facility] hospital lab) Nasopharyngeal Nasopharyngeal Swab     Status: None   Collection Time: 08/27/19  7:19 PM   Specimen: Nasopharyngeal Swab  Result Value Ref Range Status   SARS Coronavirus 2 NEGATIVE NEGATIVE Final    Comment: (NOTE) SARS-CoV-2 target nucleic acids are NOT DETECTED.  The SARS-CoV-2 RNA is generally detectable in upper and lower respiratory specimens during the acute phase of infection. The lowest concentration of SARS-CoV-2 viral copies this assay can detect is 250 copies / mL. A negative result does not preclude SARS-CoV-2 infection and  should not be used as the sole basis for treatment or other patient management decisions.  A negative result may occur with improper specimen collection / handling, submission of specimen other than nasopharyngeal swab, presence of viral mutation(s) within the areas targeted by this assay, and inadequate number of viral copies (<250 copies / mL). A negative result must be combined with clinical observations, patient history, and epidemiological information.  Fact Sheet for Patients:   BoilerBrush.com.cy  Fact Sheet for Healthcare Providers: https://pope.com/  This test is not yet approved or  cleared by the [place] FDA and has been authorized for detection and/or diagnosis of SARS-CoV-2 by FDA under an Emergency Use Authorization (EUA).  This EUA will remain in effect (meaning this test can be used) for the duration of the COVID-19 declaration under Section 564(b)(1) of the Act, 21 U.S.C. section 360bbb-3(b)(1), unless the authorization is terminated or revoked sooner.  Performed at [facility], [address] [place], [place] [address]   Blood culture (routine single)     Status: None   Collection Time: 08/27/19  8:00 PM   Specimen: BLOOD  Result Value Ref Range Status   Specimen Description   Final    BLOOD RIGHT ANTECUBITAL Performed at [facility], [address] [place], [place] [address]    Special Requests   Final    BOTTLES DRAWN AEROBIC AND ANAEROBIC Blood Culture adequate volume Performed at [facility], [address], [place], [place] [address]    Culture   Final    NO GROWTH 5 DAYS Performed at [facility], [address] [place], [place] [address]    Report Status 09/01/2019 FINAL  Final  Culture, blood (single)     Status: None   Collection Time: 08/27/19 10:09 PM   Specimen: BLOOD  Result Value Ref Range Status   Specimen Description    Final    BLOOD RIGHT ANTECUBITAL Performed at [facility], [address] [place], [place] [address]    Special Requests   Final    BOTTLES DRAWN AEROBIC AND ANAEROBIC Blood Culture results may not be optimal due to an inadequate volume of blood received in culture bottles Performed at [facility], [address] [place], [place] [address]    Culture   Final    NO GROWTH 5 DAYS Performed at [facility], [address] [place], [place] [address]    Report Status 09/02/2019 FINAL  Final  Urine culture     Status: None   Collection Time: 08/28/19  4:17 AM   Specimen: In/Out Cath Urine  Result Value Ref Range Status   Specimen Description   Final    IN/OUT CATH URINE Performed at [facility], [address] [place], [place] [address]    Special Requests   Final    NONE Performed at [facility], [address], [place], [place] [address]    Culture   Final    NO GROWTH Performed at [facility], [address] [place], [place] [address]    Report Status 08/29/2019 FINAL  Final      Radiology Studies: No results found.  Scheduled Meds: . busPIRone  30 mg Oral QHS  . Chlorhexidine Gluconate Cloth  6 each Topical Daily  . dexlansoprazole  60 mg Oral Daily  . feeding supplement  1 Container Oral TID BM  . insulin aspart  0-9 Units Subcutaneous Q6H  . QUEtiapine  50 mg Oral QHS  . sodium chloride flush  10-40 mL Intracatheter Q12H  . sucralfate  1 g Oral Q6H   Continuous Infusions: . sodium chloride 75 mL/hr at 09/02/19 0300  . meropenem (MERREM) IV Stopped (09/03/19 1448)  . TPN ADULT (ION) 95 mL/hr at 09/03/19 1716     LOS: 7 days   Time spent: 25 minutes.  Tyrone Nine, MD [facility] [email or web address] 09/03/2019, 7:24 PM

## 2019-09-03 NOTE — Progress Notes (Signed)
PHARMACY - TOTAL PARENTERAL NUTRITION CONSULT NOTE   Indication: Severe pancreatitis  Patient Measurements: Height: 5' 11"  (180.3 cm) Weight: 74.1 kg (163 lb 5.8 oz) IBW/kg (Calculated) : 75.3   Body mass index is 22.78 kg/m.    Assessment:  27 y/oM with PMH significant for CAD, recent admission for necrotizing pancreatitis, discharged from the hospital on 08/06/19. Patient has had worsening epigastric pain and difficulty tolerating PO. Pharmacy consulted to initiate TPN.   Glucose / Insulin: No history DM. CBGs 117-135 (goal 100-150). 3 units SSI required over last 24 hours.   Electrolytes: Na+ still slightly low at 132. Mag improved after supplementation yesterday, now on lower end of normal range at 1.8. All others, including Corrected Ca, WNL.  Renal: SCr low LFTs / TGs: AST/ALT increased to 130/94. Tbili, Alk Phos WNL. TG 82 (8/27), 138 (8/30) Prealbumin / albumin: < 5/1.6 Intake / Output; MIVF: I/O 4139/3225, MIVF: NS at 75 ml/hr GI Imaging: Abd CT 8/25: pancreatic necrosis > 50%, gas noted > suspect infection, no overt cholecystitis, pelvic ascites decreased, hepatic steatosis Surgeries / Procedures:   Central access: 8/26 double lumen PICC placed in PM TPN start date: 8/27  Nutritional Goals: RD recommendations (8/27) Kcal 2200-2400, Protein 110-125 grams/day  TPN at goal rate of 95 ml/hr delivers 2225 Kcal, 123 grams protein  Current Nutrition: clear liquid diet, TPN, Boost/Resource Breeze TID (refusing most)  Plan:  At 1800, continue TPN at goal rate of 95 mL/hr Electrolytes in TPN: 163mq/L of Na (increased), 533m/L of K, 49m69mL of Ca, 12.49mE64m of Mag (increased), and 10mm46m of Phos. Cl:Ac ratio 2:1 Add standard MVI and trace elements to TPN Continue sensitive SSI q6h  MIVF per MD: NS at 749ml/52mMonitor TPN labs on Mon/Thurs   Madlynn Lundeen Lindell SparmD, BCPS Clinical Pharmacist  09/03/2019,7:29 AM

## 2019-09-04 DIAGNOSIS — R7401 Elevation of levels of liver transaminase levels: Secondary | ICD-10-CM

## 2019-09-04 LAB — COMPREHENSIVE METABOLIC PANEL
ALT: 104 U/L — ABNORMAL HIGH (ref 0–44)
AST: 109 U/L — ABNORMAL HIGH (ref 15–41)
Albumin: 1.6 g/dL — ABNORMAL LOW (ref 3.5–5.0)
Alkaline Phosphatase: 101 U/L (ref 38–126)
Anion gap: 4 — ABNORMAL LOW (ref 5–15)
BUN: 10 mg/dL (ref 6–20)
CO2: 27 mmol/L (ref 22–32)
Calcium: 7.9 mg/dL — ABNORMAL LOW (ref 8.9–10.3)
Chloride: 102 mmol/L (ref 98–111)
Creatinine, Ser: 0.36 mg/dL — ABNORMAL LOW (ref 0.61–1.24)
GFR calc Af Amer: >60 mL/min (ref >60)
GFR calc non Af Amer: >60 mL/min (ref >60)
Glucose, Bld: 129 mg/dL — ABNORMAL HIGH (ref 70–99)
Potassium: 4.2 mmol/L (ref 3.5–5.1)
Sodium: 133 mmol/L — ABNORMAL LOW (ref 135–145)
Total Bilirubin: 0.2 mg/dL — ABNORMAL LOW (ref 0.3–1.2)
Total Protein: 6.2 g/dL — ABNORMAL LOW (ref 6.5–8.1)

## 2019-09-04 LAB — CBC WITH DIFFERENTIAL/PLATELET
Abs Immature Granulocytes: 0.05 10*3/uL (ref 0.00–0.07)
Basophils Absolute: 0 10*3/uL (ref 0.0–0.1)
Basophils Relative: 0 %
Eosinophils Absolute: 0.2 10*3/uL (ref 0.0–0.5)
Eosinophils Relative: 3 %
HCT: 23.7 % — ABNORMAL LOW (ref 39.0–52.0)
Hemoglobin: 7.5 g/dL — ABNORMAL LOW (ref 13.0–17.0)
Immature Granulocytes: 1 %
Lymphocytes Relative: 18 %
Lymphs Abs: 1 10*3/uL (ref 0.7–4.0)
MCH: 29.1 pg (ref 26.0–34.0)
MCHC: 31.6 g/dL (ref 30.0–36.0)
MCV: 91.9 fL (ref 80.0–100.0)
Monocytes Absolute: 0.6 10*3/uL (ref 0.1–1.0)
Monocytes Relative: 11 %
Neutro Abs: 3.6 10*3/uL (ref 1.7–7.7)
Neutrophils Relative %: 67 %
Platelets: 250 10*3/uL (ref 150–400)
RBC: 2.58 MIL/uL — ABNORMAL LOW (ref 4.22–5.81)
RDW: 14.4 % (ref 11.5–15.5)
WBC: 5.4 10*3/uL (ref 4.0–10.5)
nRBC: 0 % (ref 0.0–0.2)

## 2019-09-04 LAB — IGG 4: IgG, Subclass 4: 50 mg/dL (ref 2–96)

## 2019-09-04 LAB — GLUCOSE, CAPILLARY
Glucose-Capillary: 110 mg/dL — ABNORMAL HIGH (ref 70–99)
Glucose-Capillary: 114 mg/dL — ABNORMAL HIGH (ref 70–99)
Glucose-Capillary: 119 mg/dL — ABNORMAL HIGH (ref 70–99)

## 2019-09-04 LAB — PANCREATIC ELASTASE, FECAL: Pancreatic Elastase-1, Stool: 128 ug Elast./g — ABNORMAL LOW (ref >200)

## 2019-09-04 LAB — MAGNESIUM: Magnesium: 1.9 mg/dL (ref 1.7–2.4)

## 2019-09-04 LAB — PHOSPHORUS: Phosphorus: 3.6 mg/dL (ref 2.5–4.6)

## 2019-09-04 MED ORDER — ENSURE ENLIVE PO LIQD: 237.0000 mL | Freq: Two times a day (BID) | ORAL | Status: DC

## 2019-09-04 MED ORDER — ACETAMINOPHEN 325 MG PO TABS
650.0000 mg | ORAL_TABLET | Freq: Four times a day (QID) | ORAL | Status: DC | PRN
  Administered 2019-09-04 – 2019-09-09 (×4): 650 mg via ORAL
  Filled 2019-09-04 (×4): qty 2

## 2019-09-04 MED ORDER — TRAVASOL 10 % IV SOLN
INTRAVENOUS | Status: AC
  Filled 2019-09-04: qty 1231.2

## 2019-09-04 MED ORDER — INSULIN ASPART 100 UNIT/ML ~~LOC~~ SOLN
0.0000 [IU] | Freq: Three times a day (TID) | SUBCUTANEOUS | Status: DC
  Administered 2019-09-05: 1 [IU] via SUBCUTANEOUS

## 2019-09-04 NOTE — Progress Notes (Signed)
    Progress Note   Subjective  Mild abdominal pain. Tried a small amount of full liquid diet without increased pain. Up in chair for hours at a time and ambulating in the halls.    Objective  Vital signs in last 24 hours: Temp:  [98.6 F (37 C)-99.4 F (37.4 C)] 99.4 F (37.4 C) (09/02 0626) Pulse Rate:  [85-90] 90 (09/02 0626) Resp:  [16-18] 18 (09/02 0626) BP: (133-147)/(84-90) 133/84 (09/02 0626) SpO2:  [97 %-100 %] 97 % (09/02 0626) Weight:  [70.8 kg] 70.8 kg (09/02 0626) Last BM Date: 09/02/19  General: Alert, well-developed, in NAD Heart:  Regular rate and rhythm; no murmurs Chest: Clear to ascultation bilaterally Abdomen:  Soft, mild upper abdominal tenderness and nondistended. Normal bowel sounds, without guarding, and without rebound.   Extremities:  Without edema. Neurologic:  Alert and  oriented x4; grossly normal neurologically. Psych:  Alert and cooperative. Normal mood and affect.  Intake/Output from previous day: 09/01 0701 - 09/02 0700 In: 4348.9 [P.O.:720; I.V.:3430.8; IV Piggyback:198.1] Out: 3675 [Urine:3675] Intake/Output this shift: Total I/O In: 687.8 [I.V.:587.8; IV Piggyback:100] Out: -   Lab Results: Recent Labs    09/04/19 0327  WBC 5.4  HGB 7.5*  HCT 23.7*  PLT 250   BMET Recent Labs    09/02/19 0423 09/03/19 0345 09/04/19 0327  NA 132* 132* 133*  K 4.0 4.2 4.2  CL 97* 100 102  CO2 26 26 27   GLUCOSE 139* 149* 129*  BUN 7 8 10   CREATININE 0.44* 0.39* 0.36*  CALCIUM 8.2* 8.0* 7.9*   LFT Recent Labs    09/04/19 0327  PROT 6.2*  ALBUMIN 1.6*  AST 109*  ALT 104*  ALKPHOS 101  BILITOT 0.2*      Assessment & Recommendations   1. Severe necrotizing etoh pancreatitis with 2 areas of walled off necrosis, slowly improving. Encouraged to try more of the full liquid diet as tolerated. Repeat CT AP in about 1 week. Dr. will review CT AP images and clinical progress next week to assess if amenable to endoscopic  necrosectomy. Continue antibiotics for at least 10-14 days, currently on Meropenum. Fecal elastase and IgG4 are both pending.  2. Malnutrition. TPN continues and full liquids as tolerated.   3. Elevated transaminases due to hepatic steatosis and recent increase could be TPN related. Trend.   4. Mild intermittent diarrhea. Await fecal elastase. Imodium prn.   5. Anemia, multifactorial. Trend CBC.   6. GERD, currently better controlled on Dexilant 60 mg qam.      LOS: 8 days   Daylee Delahoz T. 11/04/19 MD 09/04/2019, 10:11 AM

## 2019-09-04 NOTE — Progress Notes (Signed)
Nutrition Follow-up  DOCUMENTATION CODES:   Non-severe (moderate) malnutrition in context of acute illness/injury  INTERVENTION:  Continue TPN management per pharmacy  D/c Boost Breeze  Ensure Enlive po BID, each supplement provides 350 kcal and 20 grams of protein  NUTRITION DIAGNOSIS:   Moderate Malnutrition related to acute illness (necrotizing pancreatitis) as evidenced by energy intake < 75% for > 7 days, mild muscle depletion, mild fat depletion, percent weight loss, edema.  Ongoing  GOAL:   Patient will meet greater than or equal to 90% of their needs  Met with TPN  MONITOR:   Supplement acceptance, Skin, I & O's, Diet advancement, Other (Comment), PO intake, Labs, Weight trends (TPN)  REASON FOR ASSESSMENT:   Consult New TPN/TNA  ASSESSMENT:   Pt admitted with acute on chronic EtOH necrotizing pancreatitis with recent admission for EtOH necrotizing/hemorrhagic pancreatitis (discharged 3 weeks ago) complicated by EtOH withdrawal. PMH includes CAD s/p stenting 10 years ago, anxiety, depression, EtOH abuse, HTN.  8/27 TPN initiated  9/1 diet advanced to full liquids   Pt continues on TPN, currently at goal of 13m/hr (provides 2225 kcal, 123 grams protein). Diet was advanced to full liquids yesterday. Per MD, no plans to further advance diet at this time. Pt will have repeat CT in 2 weeks and possible endoscopic necrosectomy.   Pt with orders for Boost Breeze TID. Per RN, pt has been refusing these. Will transition pt to Ensure Enlive.   Pt with 50% average intake of clear liquid trays. No meal documentation available since diet was upgraded to full liquids.   Labs: Na 133 (L), CBGs 108-119 Medications: Novolog, Carafate  Diet Order:   Diet Order            Diet full liquid Room service appropriate? Yes; Fluid consistency: Thin  Diet effective now                 EDUCATION NEEDS:   No education needs have been identified at this time  Skin:  Skin  Assessment: Reviewed RN Assessment  Last BM:  8/31  Height:   Ht Readings from Last 1 Encounters:  08/28/19 5' 11"  (1.803 m)    Weight:   Wt Readings from Last 1 Encounters:  09/04/19 70.8 kg    BMI:  Body mass index is 21.77 kg/m.  Estimated Nutritional Needs:   Kcal:  2200-2400  Protein:  110-125 grams  Fluid:  >/=2.2L/d    ALarkin Ina MS, RD, LDN RD pager number and weekend/on-call pager number located in [place]

## 2019-09-04 NOTE — Progress Notes (Signed)
PROGRESS NOTE  Gary Atkinson  QPY:[id number] DOB: 31-Oct-1970 DOA: 08/27/2019 PCP: [facility]   Brief Narrative: Patient is a 49 year old male with history of coronary artery disease status post stenting, recently admitted for acute necrotizing pancreatitis and was discharged on 08/06/2019 presented with persistent abdominal pain.  After discharge, patient has not been able to tolerate the food.  He was complaining of worsening pain on the epigastric area, was vomiting and also diarrhea.    CT abdomen/pelvis done in the emergency department showed acute on chronic pancreatitis with possible infection/organized necrotic tissue with possible obstruction of the splenic vein.  GI consulted.  Started on IV antibiotics, IV fluids.  Started on TPN because of expected prolonged decreased oral intake.  Assessment & Plan: Principal Problem:   Pancreatitis, necrotizing Active Problems:   CAD (coronary artery disease), native coronary artery   Benign essential HTN   Pancreatic necrosis   Acute pancreatitis   Malnutrition of moderate degree  Acute, recurrent alcohol-induced necrotizing pancreatitis: Recently admitted for the management of alcoholic pancreatitis and was discharged on 08/06/2019. CT imaging concerning for walled off necrosis, pancreatic collections, occlusion of the splenic vein..  - Continue meropenem x10-14 days per GI, IV fluids, (PICC placed 8/26) TPN, diet advanced to full liquids, but not likely to progress beyond that at this time. Appreciate GI consult, plans to pursue repeat CT at 1-2 weeks and possible endoscopic necrosectomy.  - Surgery consulted, though no open necrosectomy/debridement currently indicated.  - Continue IV pain medications, antiemetics  - Remain on full liquids for the foreseeable future. Once advancing diet to solids, would supplement with creon.   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  - Continue ASA   HTN: BP stable. - Continue as needed medication for hypertension.  History of alcohol abuse: No evidence of withdrawal during this admission.  - Continue thiamine, folic acid.  - Cessation recommended   Iron deficiency /normocytic anemia: Hemoglobin dropped in the range of 8.  He has been transfused with a unit of PRBC on 08/28/2019.  No history of bleeding, hematochezia, melena. FOBT is negative.  - s/p IV iron 8/27. Ferritin 366, Iron 21, 21% sat indicative of active inflammation. - Continue PPI BID.   Hypomagnesemia: Supplemented with magnesium.  - Continue to monitor.  Hyponatremia: Mild.  - Continue to monitor.  Severe protein calorie malnutrition:  - Continue TPN.  Albumin of 1.5.  Diarrhea: ?Exocrine insufficiency - Continue prn iImodium.  DVT prophylaxis: Heparin Code Status: Full Family Communication: None at bedside Disposition Plan:   Status is: Inpatient  Remains inpatient appropriate because:Persistent severe electrolyte disturbances and IV treatments appropriate due to intensity of illness or inability to take PO  Dispo: The patient is from: Home              Anticipated d/c is to: Home              Anticipated d/c date is: > 3 days              Patient currently is not medically stable to d/c.  Consultants:   GI  General surgery  Procedures:   PICC insertion  Antimicrobials:  Meropenem   Subjective: Stable pain in upper abdomen radiating bilaterally that is constant, mild-moderate, no worse with liquids, didn't eat/drink  breakfast today or dinner yesterday. Nausea and vomiting improved Objective: Vitals:   09/03/19 1217 09/03/19 2131 09/04/19 0626 09/04/19 1426  BP: (!) 147/90 (!) 146/86 133/84 119/74  Pulse: 89 85 90 85  Resp: 16 18 18 14   Temp: 99.2 F (37.3 C) 98.6 F  (37 C) 99.4 F (37.4 C) 98 F (36.7 C)  TempSrc: Oral Oral Oral Oral  SpO2: 100% 100% 97% 98%  Weight:   70.8 kg   Height:        Intake/Output Summary (Last 24 hours) at 09/04/2019 1553 Last data filed at 09/04/2019 1513 Gross per 24 hour  Intake 5663.23 ml  Output 3825 ml  Net 1838.23 ml   Filed Weights   08/30/19 1100 09/02/19 0500 09/04/19 0626  Weight: 75.5 kg 74.1 kg 70.8 kg   Gen: [age] y.o. male in no distress Pulm: Nonlabored breathing room air. Clear. CV: Regular rate and rhythm. No murmur, rub, or gallop. No JVD, no dependent edema. GI: Abdomen soft, stable epigastric tenderness, non-distended, with normoactive bowel sounds.  Ext: Warm, no deformities Skin: No rashes, lesions or ulcers on visualized skin. Neuro: Alert and oriented. No focal neurological deficits. Psych: Judgement and insight appear fair. Mood euthymic & affect congruent. Behavior is appropriate.    Data Reviewed: I have personally reviewed following labs and imaging studies  CBC: Recent Labs  Lab 08/29/19 0556 09/01/19 0354 09/04/19 0327  WBC 4.4 4.5 5.4  NEUTROABS 3.0 2.9 3.6  HGB 8.0* 8.4* 7.5*  HCT 23.7* 25.8* 23.7*  MCV 88.4 90.8 91.9  PLT 267 296 250   Basic Metabolic Panel: Recent Labs  Lab 08/31/19 0337 09/01/19 0354 09/02/19 0423 09/03/19 0345 09/04/19 0327  NA 134* 133* 132* 132* 133*  K 4.0 4.4 4.0 4.2 4.2  CL 100 99 97* 100 102  CO2 24 26 26 26 27   GLUCOSE 124* 130* 139* 149* 129*  BUN <5* 5* 7 8 10   CREATININE 0.45* 0.42* 0.44* 0.39* 0.36*  CALCIUM 8.0* 8.2* 8.2* 8.0* 7.9*  MG 1.8 1.8 1.6* 1.8 1.9  PHOS 2.7 4.1 4.3 3.9 3.6   GFR: Estimated Creatinine Clearance: 109.4 mL/min (A) (by C-G formula based on SCr of 0.36 mg/dL (L)). Liver Function Tests: Recent Labs  Lab 08/30/19 0338 09/01/19 0354 09/02/19 0423 09/03/19 0345 09/04/19 0327  AST 64* 41 54* 130* 109*  ALT 48* 41 48* 94* 104*  ALKPHOS 190* 141* 118 105 101  BILITOT 0.9 0.3 0.6 0.4 0.2*  PROT 5.4*  5.9* 6.0* 6.2* 6.2*  ALBUMIN 1.5* 1.6* 1.6* 1.6* 1.6*   No results for input(s): LIPASE, AMYLASE in the last 168 hours. No results for input(s): AMMONIA in the last 168 hours. Coagulation Profile: No results for input(s): INR, PROTIME in the last 168 hours. Cardiac Enzymes: No results for input(s): CKTOTAL, CKMB, CKMBINDEX, TROPONINI in the last 168 hours. BNP (last 3 results) No results for input(s): PROBNP in the last 8760 hours. HbA1C: No results for input(s): HGBA1C in the last 72 hours. CBG: Recent Labs  Lab 09/03/19 0606 09/03/19 1151 09/03/19 1700 09/04/19 0009 09/04/19 0624  GLUCAP 126* 117* 108* 110* 119*   Lipid Profile: No results for input(s): CHOL, HDL, LDLCALC, TRIG, CHOLHDL, LDLDIRECT in the last 72 hours. Thyroid Function Tests: No results for input(s): TSH, T4TOTAL, FREET4, T3FREE, THYROIDAB in the last 72 hours. Anemia Panel: No results for input(s): VITAMINB12, FOLATE, FERRITIN, TIBC, IRON, RETICCTPCT in the last 72 hours. Urine analysis:    Component Value Date/Time  COLORURINE YELLOW 08/28/2019 0417   APPEARANCEUR CLEAR 08/28/2019 0417   LABSPEC 1.005 08/28/2019 0417   PHURINE 5.0 08/28/2019 0417   GLUCOSEU NEGATIVE 08/28/2019 0417   HGBUR NEGATIVE 08/28/2019 0417   BILIRUBINUR NEGATIVE 08/28/2019 0417   KETONESUR 5 (A) 08/28/2019 0417   PROTEINUR NEGATIVE 08/28/2019 0417   NITRITE NEGATIVE 08/28/2019 0417   LEUKOCYTESUR NEGATIVE 08/28/2019 0417   Recent Results (from the past 240 hour(s))  SARS Coronavirus 2 by RT PCR (hospital order, performed in [facility] hospital lab) Nasopharyngeal Nasopharyngeal Swab     Status: None   Collection Time: 08/27/19  7:19 PM   Specimen: Nasopharyngeal Swab  Result Value Ref Range Status   SARS Coronavirus 2 NEGATIVE NEGATIVE Final    Comment: (NOTE) SARS-CoV-2 target nucleic acids are NOT DETECTED.  The SARS-CoV-2 RNA is generally detectable in upper and lower respiratory specimens during the acute phase  of infection. The lowest concentration of SARS-CoV-2 viral copies this assay can detect is 250 copies / mL. A negative result does not preclude SARS-CoV-2 infection and should not be used as the sole basis for treatment or other patient management decisions.  A negative result may occur with improper specimen collection / handling, submission of specimen other than nasopharyngeal swab, presence of viral mutation(s) within the areas targeted by this assay, and inadequate number of viral copies (<250 copies / mL). A negative result must be combined with clinical observations, patient history, and epidemiological information.  Fact Sheet for Patients:   BoilerBrush.com.cy  Fact Sheet for Healthcare Providers: https://pope.com/  This test is not yet approved or  cleared by the [place] FDA and has been authorized for detection and/or diagnosis of SARS-CoV-2 by FDA under an Emergency Use Authorization (EUA).  This EUA will remain in effect (meaning this test can be used) for the duration of the COVID-19 declaration under Section 564(b)(1) of the Act, 21 U.S.C. section 360bbb-3(b)(1), unless the authorization is terminated or revoked sooner.  Performed at [facility], [address], [place], [place] [address]   Blood culture (routine single)     Status: None   Collection Time: 08/27/19  8:00 PM   Specimen: BLOOD  Result Value Ref Range Status   Specimen Description   Final    BLOOD RIGHT ANTECUBITAL Performed at [facility], [address], [place], [place] [address]    Special Requests   Final    BOTTLES DRAWN AEROBIC AND ANAEROBIC Blood Culture adequate volume Performed at [facility], [address], [place], [place] [address]    Culture   Final    NO GROWTH 5 DAYS Performed at [facility], [address] [place], [place] [address]    Report Status  09/01/2019 FINAL  Final  Culture, blood (single)     Status: None   Collection Time: 08/27/19 10:09 PM   Specimen: BLOOD  Result Value Ref Range Status   Specimen Description   Final    BLOOD RIGHT ANTECUBITAL Performed at [facility], [address], [place], [place] [address]    Special Requests   Final    BOTTLES DRAWN AEROBIC AND ANAEROBIC Blood Culture results may not be optimal due to an inadequate volume of blood received in culture bottles Performed at [facility], [address] [place], [place] [address]    Culture   Final    NO GROWTH 5 DAYS Performed at [facility], [address], [place], [place] [address]    Report Status 09/02/2019 FINAL  Final  Urine culture     Status: None   Collection Time: 08/28/19  4:17 AM   Specimen: In/Out Cath Urine  Result Value Ref Range Status   Specimen Description   Final    IN/OUT CATH URINE Performed at [facility], [address] [place], [place] [address]    Special Requests   Final    NONE Performed at [facility], [address], [place], [place] [address]    Culture   Final    NO GROWTH Performed at [facility], [address] [place], [place] [address]    Report Status 08/29/2019 FINAL  Final      Radiology Studies: No results found.  Scheduled Meds: . busPIRone  30 mg Oral QHS  . Chlorhexidine Gluconate Cloth  6 each Topical Daily  . dexlansoprazole  60 mg Oral Daily  . feeding supplement (ENSURE ENLIVE)  237 mL Oral BID BM  . insulin aspart  0-9 Units Subcutaneous Q8H  . sodium chloride flush  10-40 mL Intracatheter Q12H  . sucralfate  1 g Oral Q6H   Continuous Infusions: . sodium chloride 75 mL/hr at 09/04/19 1244  . meropenem (MERREM) IV 1 g (09/04/19 1415)  . TPN ADULT (ION) 95 mL/hr at 09/03/19 1716  . TPN ADULT (ION)       LOS: 8 days   Time spent: 25 minutes.  Tyrone Nine, MD [facility] [email or web address] 09/04/2019, 3:53 PM

## 2019-09-04 NOTE — Progress Notes (Signed)
PHARMACY - TOTAL PARENTERAL NUTRITION CONSULT NOTE   Indication: Severe pancreatitis  Patient Measurements: Height: _0  (180.3 cm) Weight: 70.8 kg (156 lb 1.4 oz) IBW/kg (Calculated) : 75.3   Body mass index is 21.77 kg/m.    Assessment:  39 y/oM with PMH significant for CAD, recent admission for necrotizing pancreatitis, discharged from the hospital on 08/06/19. Patient has had worsening epigastric pain and difficulty tolerating PO. Pharmacy consulted to initiate TPN.   Glucose / Insulin: No history DM. CBGs 108-149 (goal 100-150). No SSI required over last 24 hours.   Electrolytes: Na (133) remains slightly low, Mg (1.9) on lower end of normal. All other lytes WNL including CorrCa (9.8) Renal: SCr low/stable, BUN WNL LFTs / TGs: AST/ALT (109/104) slightly elevated; Tbili 0.2, Alk Phos WNL. TG 82 (8/27) > 138 (8/30) Prealbumin / albumin: < 5/1.6; both low Intake / Output; MIVF: UOP 3675 mL; I/O net: +673 mL. MIVF: NS at 75 ml/hr GI Imaging:  -Abd CT 8/25: pancreatic necrosis > 50%, gas noted > suspect infection, no overt cholecystitis, pelvic ascites decreased, hepatic steatosis Surgeries / Procedures:   Central access: 8/26 double lumen PICC placed in PM TPN start date: 8/27  Nutritional Goals: RD recommendations (9/2): Kcal 2200-2400, Protein 110-125 grams/day, fluid >/= 2.2L/day  TPN at goal rate of 95 ml/hr delivers 2225 Kcal, 123 grams protein  Current Nutrition: Full liquid diet, TPN, Ensure Enlive BID (refused all Boost on 9/1)  Plan:   At 1800, continue TPN at goal rate of 95 mL/hr  Electrolytes in TPN: Slightly increase Mg  186mq/L of Na, 532m/L of K, 15m715mL of Ca, 62m315m of Mag, and 10mm58m of Phos.   Cl:Ac ratio 2:1  Add standard MVI and trace elements to TPN  Decrease frequency of sensitive SSI to q8h  MIVF per MD: NS at 715ml/96m Monitor TPN labs on Mon/Thurs, recheck electrolytes with AM labs tomorrow  Khalilah Hoke MLenis NoonmD 09/04/19 7:45  AM

## 2019-09-05 LAB — BASIC METABOLIC PANEL
Anion gap: 6 (ref 5–15)
BUN: 9 mg/dL (ref 6–20)
CO2: 25 mmol/L (ref 22–32)
Calcium: 7.9 mg/dL — ABNORMAL LOW (ref 8.9–10.3)
Chloride: 104 mmol/L (ref 98–111)
Creatinine, Ser: 0.33 mg/dL — ABNORMAL LOW (ref 0.61–1.24)
GFR calc Af Amer: >60 mL/min (ref >60)
GFR calc non Af Amer: >60 mL/min (ref >60)
Glucose, Bld: 132 mg/dL — ABNORMAL HIGH (ref 70–99)
Potassium: 4.1 mmol/L (ref 3.5–5.1)
Sodium: 135 mmol/L (ref 135–145)

## 2019-09-05 LAB — PHOSPHORUS: Phosphorus: 3.8 mg/dL (ref 2.5–4.6)

## 2019-09-05 LAB — GLUCOSE, CAPILLARY
Glucose-Capillary: 117 mg/dL — ABNORMAL HIGH (ref 70–99)
Glucose-Capillary: 119 mg/dL — ABNORMAL HIGH (ref 70–99)
Glucose-Capillary: 124 mg/dL — ABNORMAL HIGH (ref 70–99)

## 2019-09-05 LAB — MAGNESIUM: Magnesium: 1.9 mg/dL (ref 1.7–2.4)

## 2019-09-05 MED ORDER — TRAVASOL 10 % IV SOLN
INTRAVENOUS | Status: AC
  Filled 2019-09-05: qty 1231.2

## 2019-09-05 MED ORDER — PANCRELIPASE (LIP-PROT-AMYL) 12000-38000 UNITS PO CPEP
24000.0000 [IU] | ORAL_CAPSULE | Freq: Three times a day (TID) | ORAL | Status: DC
  Administered 2019-09-05 – 2019-09-10 (×15): 24000 [IU] via ORAL
  Filled 2019-09-05 (×15): qty 2

## 2019-09-05 NOTE — Progress Notes (Signed)
Nutrition Brief Note  RD consulted for 48 hour calorie count. Ordered for 9/3-9/4. Diet just advanced to soft this morning.  Results will be available 9/6.   If other nutrition issues arise, please consult RD.   Tilda Franco, MS, RD, LDN Inpatient Clinical Dietitian Contact information available via Amion

## 2019-09-05 NOTE — Progress Notes (Signed)
PHARMACY - TOTAL PARENTERAL NUTRITION CONSULT NOTE   Indication: Severe pancreatitis  Patient Measurements: Height: _0  (180.3 cm) Weight: 70.8 kg (156 lb 1.4 oz) IBW/kg (Calculated) : 75.3   Body mass index is 21.77 kg/m.    Assessment:  27 y/oM with PMH significant for CAD, recent admission for necrotizing pancreatitis, discharged from the hospital on 08/06/19. Patient has had worsening epigastric pain and difficulty tolerating PO. Pharmacy consulted to initiate TPN.   Glucose / Insulin: No history DM. CBGs within goal range  (goal 100-150). 1 unit  SSI required over last 24 hours.   Electrolytes: Na improves to 135 (max Na in TPN), Mg (1.9) on lower end of normal. All other lytes WNL including CorrCa (9.8) Renal: SCr low/stable, BUN WNL LFTs / TGs: AST/ALT (109/104) slightly elevated; Tbili 0.2, Alk Phos WNL. TG 82 (8/27) > 138 (8/30) Prealbumin / albumin: < 5/1.6; both low Intake / Output; MIVF: I/O +2593 mL - MIVF: NS at 75 ml/hr GI Imaging:  -Abd CT 8/25: pancreatic necrosis > 50%, gas noted > suspect infection, no overt cholecystitis, pelvic ascites decreased, hepatic steatosis Surgeries / Procedures:   Central access: 8/26 double lumen PICC placed in PM TPN start date: 8/27  Nutritional Goals: RD recommendations (9/2): Kcal 2200-2400, Protein 110-125 grams/day, fluid >/= 2.2L/day  TPN at goal rate of 95 ml/hr delivers 2225 Kcal, 123 grams protein  Current Nutrition: Full liquid diet, TPN, Ensure Enlive BID (refusing)  Plan:   At 1800, continue TPN at goal rate of 95 mL/hr  Electrolytes in TPN:   192mq/L of Na, 574m/L of K, 40m52mL of Ca, 140m63m of Mag, and 10mm52m of Phos.   Cl:Ac ratio 2:1  Add standard MVI and trace elements to TPN  Continue sensitive SSI to q8h  MIVF per MD: NS at 740ml/21m Monitor TPN labs on Mon/Thurs  BMET, phos, Mag on 9/4  Maryori Weide, Lynelle DoctormD 09/05/19 9:30 AM

## 2019-09-05 NOTE — Progress Notes (Signed)
Patient ID: Gary Atkinson, male   DOB: 1970/06/26, 49 y.o.   MRN: [id number]    Progress Note   Subjective   day # 9  CC; necrotizing pancreatitis  Meropenem IV-day 8 TPN  09/04/2019 WBC 5.4, hemoglobin 7.5 IgG4 within normal limits 9/1-CR P 4.0 Fecal elastase 128-consistent with moderate insufficiency  Patient states his pain is at a 5-6, doing well trying to avoid analgesics.  No nausea or vomiting.  Has been able to tolerate some full liquids.  Diarrhea generally just once daily and less liquid.  Reflux improved on Dexilant.  Patient has been able to ambulate in the hall with assistance   Objective   Vital signs in last 24 hours: Temp:  [98 F (36.7 C)-98.7 F (37.1 C)] 98.6 F (37 C) (09/03 0516) Pulse Rate:  [79-85] 85 (09/03 0516) Resp:  [14-18] 18 (09/03 0516) BP: (119-155)/(74-94) 140/85 (09/03 0516) SpO2:  [98 %-100 %] 98 % (09/03 0516) Weight:  [70.8 kg] 70.8 kg (09/02 1326) Last BM Date: 09/04/19 General:    white male in NAd, chronically ill-appearing Heart:  Regular rate and rhythm; no murmurs Lungs: Respirations even and unlabored, lungs CTA bilaterally Abdomen:  Soft, tender across the upper abdomen no rebound nondistended. Normal bowel sounds. Extremities:  Without edema. Neurologic:  Alert and oriented,  grossly normal neurologically. Psych:  Cooperative. Normal mood and affect.  Intake/Output from previous day: 09/02 0701 - 09/03 0700 In: 4418.4 [I.V.:4118.4; IV Piggyback:300] Out: 1825 [Urine:1825] Intake/Output this shift: No intake/output data recorded.  Lab Results: Recent Labs    09/04/19 0327  WBC 5.4  HGB 7.5*  HCT 23.7*  PLT 250   BMET Recent Labs    09/03/19 0345 09/04/19 0327 09/05/19 0403  NA 132* 133* 135  K 4.2 4.2 4.1  CL 100 102 104  CO2 26 27 25   GLUCOSE 149* 129* 132*  BUN 8 10 9   CREATININE 0.39* 0.36* 0.33*  CALCIUM 8.0* 7.9* 7.9*   LFT Recent Labs    09/04/19 0327  PROT 6.2*  ALBUMIN 1.6*  AST 109*  ALT  104*  ALKPHOS 101  BILITOT 0.2*   PT/INR No results for input(s): LABPROT, INR in the last 72 hours.      Assessment / Plan:    #37 49 year old white male with severe necrotizing alcohol induced pancreatitis, with significant increase in necrosis on imaging at the time of readmit.  He has 2 areas of walled off necrosis.  He had gas bubbles in the pancreatic head collection on admit consistent with infected necrosis. He has had significant improvement over the past week on IV meropenem.  Pain has decreased and he has been able to gradually increase p.o. intake.  #2 malnutrition-on TPN and full liquids #3 mild diarrhea-fecal elastase consistent with moderate pancreatic insufficiency #4 anemia multifactorial-stable #5 history of severe GERD/refractory-improved on Dexilant and Carafate #6 debilitation-improving and able to ambulate  Plan; will advance to soft low-fat diet, continue Ensure as tolerated. Start calorie counts If able to tolerate solid diet, can wean off TPN over the next 2 to 3 days Continue IV meropenem-plan for 10 to 14-day course prior to transitioning to oral antibiotics  Plan is for repeat CT imaging early next week, prior to discharge, to assure  areas of necrosis, all are stable and no evidence for infected necrosis.  He may need eventual necrosectomy endoscopically, timing to be determined based on follow-up imaging.  Patient will not be ready for discharge prior to follow-up CT which  we will plan for Monday or Tuesday.  will add Creon AC. DC Protonix-continue Dexilant and Carafate    Principal Problem:   Pancreatitis, necrotizing Active Problems:   CAD (coronary artery disease), native coronary artery   Benign essential HTN   Pancreatic necrosis   Acute pancreatitis   Malnutrition of moderate degree     LOS: 9 days   Adean Milosevic PA-C 09/05/2019, 8:51 AM

## 2019-09-05 NOTE — Progress Notes (Signed)
PROGRESS NOTE  Gary Atkinson  ZOX:[id number] DOB: 07-13-1970 DOA: 08/27/2019 PCP: [facility]   Brief Narrative: Patient is a 49 year old male with history of coronary artery disease status post stenting, recently admitted for acute necrotizing pancreatitis and was discharged on 08/06/2019 presented with persistent abdominal pain.  After discharge, patient has not been able to tolerate the food.  He was complaining of worsening pain on the epigastric area, was vomiting and also diarrhea.    CT abdomen/pelvis done in the emergency department showed acute on chronic pancreatitis with possible infection/organized necrotic tissue with possible obstruction of the splenic vein.  GI consulted.  Started on IV antibiotics, IV fluids.  Started on TPN because of expected prolonged decreased oral intake.  Assessment & Plan: Principal Problem:   Pancreatitis, necrotizing Active Problems:   CAD (coronary artery disease), native coronary artery   Benign essential HTN   Pancreatic necrosis   Acute pancreatitis   Malnutrition of moderate degree  Acute, recurrent alcohol-induced necrotizing pancreatitis: Recently admitted for the management of alcoholic pancreatitis and was discharged on 08/06/2019. CT imaging concerning for walled off necrosis, pancreatic collections, occlusion of the splenic vein..  - Continue meropenem x10-14 days per GI, IV fluids, (PICC placed 8/26) TPN, diet advanced to full liquids, but not likely to progress beyond that at this time. Appreciate GI consult, plans to pursue repeat CT early next week and possible endoscopic necrosectomy.  - Surgery consulted, though no open necrosectomy/debridement currently indicated.  - Continue IV pain medications, antiemetics  - Remain on full liquids for the foreseeable future.  Creon has been added by GI.  Per GI, patient is not ready for discharge yet.  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  - Continue ASA   HTN: BP stable. - Continue as needed medication for hypertension.  History of alcohol abuse: No evidence of withdrawal during this admission.  - Continue thiamine, folic acid.  - Cessation recommended   Iron deficiency /normocytic anemia: Hemoglobin dropped in the range of 8.  He has been transfused with a unit of PRBC on 08/28/2019.  No history of bleeding, hematochezia, melena. FOBT is negative.  Hemoglobin dropped to 7.5 yesterday.  Will recheck in the morning. - s/p IV iron 8/27. Ferritin 366, Iron 21, 21% sat indicative of active inflammation. - Continue PPI BID.   Hypomagnesemia:  Supplemented and now resolved.  - Continue to monitor.  Hyponatremia: Resolved - Continue to monitor.  Severe protein calorie malnutrition:  - Continue TPN.   Diarrhea: ?Exocrine insufficiency - Continue prn iImodium.  DVT prophylaxis: Heparin Code Status: Full Family Communication: None at bedside Disposition Plan:   Status is: Inpatient  Remains inpatient appropriate because:Persistent severe electrolyte disturbances and IV treatments appropriate due to intensity of illness or inability to take PO  Dispo: The patient is from: Home              Anticipated d/c is to: Home              Anticipated d/c date is: > 3 days              Patient currently is not medically stable to d/c.  Consultants:   GI  General surgery  Procedures:   PICC insertion  Antimicrobials:  Meropenem   Subjective: Patient  seen and examined.  Continues to complain of abdominal pain but no worse than yesterday.  No nausea.  Tolerating full liquid diet.  No other complaint.  Objective: Vitals:   09/04/19 1326 09/04/19 1426 09/04/19 2021 09/05/19 0516  BP:  119/74 (!) 155/94 140/85  Pulse:  85 79 85  Resp:  14 18 18    Temp:  98 F (36.7 C) 98.7 F (37.1 C) 98.6 F (37 C)  TempSrc:  Oral Oral Oral  SpO2:  98% 100% 98%  Weight: 70.8 kg     Height:        Intake/Output Summary (Last 24 hours) at 09/05/2019 1157 Last data filed at 09/05/2019 1009 Gross per 24 hour  Intake 4537.67 ml  Output 1075 ml  Net 3462.67 ml   Filed Weights   09/02/19 0500 09/04/19 0626 09/04/19 1326  Weight: 74.1 kg 70.8 kg 70.8 kg   General exam: Appears calm and comfortable  Respiratory system: Clear to auscultation. Respiratory effort normal. Cardiovascular system: S1 & S2 heard, RRR. No JVD, murmurs, rubs, gallops or clicks. No pedal edema. Gastrointestinal system: Abdomen is nondistended, soft and tender at periumbilical, left middle abdomen and epigastric and right upper quadrant area. No organomegaly or masses felt. Normal bowel sounds heard. Central nervous system: Alert and oriented. No focal neurological deficits. Extremities: Symmetric 5 x 5 power. Skin: No rashes, lesions or ulcers.  Psychiatry: Judgement and insight appear normal. Mood & affect appropriate.    Data Reviewed: I have personally reviewed following labs and imaging studies  CBC: Recent Labs  Lab 09/01/19 0354 09/04/19 0327  WBC 4.5 5.4  NEUTROABS 2.9 3.6  HGB 8.4* 7.5*  HCT 25.8* 23.7*  MCV 90.8 91.9  PLT 296 250   Basic Metabolic Panel: Recent Labs  Lab 09/01/19 0354 09/02/19 0423 09/03/19 0345 09/04/19 0327 09/05/19 0403  NA 133* 132* 132* 133* 135  K 4.4 4.0 4.2 4.2 4.1  CL 99 97* 100 102 104  CO2 26 26 26 27 25   GLUCOSE 130* 139* 149* 129* 132*  BUN 5* 7 8 10 9   CREATININE 0.42* 0.44* 0.39* 0.36* 0.33*  CALCIUM 8.2* 8.2* 8.0* 7.9* 7.9*  MG 1.8 1.6* 1.8 1.9 1.9  PHOS 4.1 4.3 3.9 3.6 3.8   GFR: Estimated Creatinine Clearance: 109.4 mL/min (A) (by C-G formula based on SCr of 0.33 mg/dL (L)). Liver Function Tests: Recent Labs  Lab 08/30/19 0338 09/01/19 0354 09/02/19 0423 09/03/19 0345 09/04/19 0327  AST 64* 41  54* 130* 109*  ALT 48* 41 48* 94* 104*  ALKPHOS 190* 141* 118 105 101  BILITOT 0.9 0.3 0.6 0.4 0.2*  PROT 5.4* 5.9* 6.0* 6.2* 6.2*  ALBUMIN 1.5* 1.6* 1.6* 1.6* 1.6*   No results for input(s): LIPASE, AMYLASE in the last 168 hours. No results for input(s): AMMONIA in the last 168 hours. Coagulation Profile: No results for input(s): INR, PROTIME in the last 168 hours. Cardiac Enzymes: No results for input(s): CKTOTAL, CKMB, CKMBINDEX, TROPONINI in the last 168 hours. BNP (last 3 results) No results for input(s): PROBNP in the last 8760 hours. HbA1C: No results for input(s): HGBA1C in the last 72 hours. CBG: Recent Labs  Lab 09/04/19 0009 09/04/19 0624 09/04/19 1645 09/05/19 0034 09/05/19 0729  GLUCAP 110* 119* 114* 124* 119*   Lipid Profile: No results for input(s): CHOL, HDL, LDLCALC, TRIG, CHOLHDL, LDLDIRECT in the last 72 hours. Thyroid Function Tests: No results for input(s): TSH, T4TOTAL, FREET4, T3FREE, THYROIDAB in the last 72 hours. Anemia  Panel: No results for input(s): VITAMINB12, FOLATE, FERRITIN, TIBC, IRON, RETICCTPCT in the last 72 hours. Urine analysis:    Component Value Date/Time   COLORURINE YELLOW 08/28/2019 0417   APPEARANCEUR CLEAR 08/28/2019 0417   LABSPEC 1.005 08/28/2019 0417   PHURINE 5.0 08/28/2019 0417   GLUCOSEU NEGATIVE 08/28/2019 0417   HGBUR NEGATIVE 08/28/2019 0417   BILIRUBINUR NEGATIVE 08/28/2019 0417   KETONESUR 5 (A) 08/28/2019 0417   PROTEINUR NEGATIVE 08/28/2019 0417   NITRITE NEGATIVE 08/28/2019 0417   LEUKOCYTESUR NEGATIVE 08/28/2019 0417   Recent Results (from the past 240 hour(s))  SARS Coronavirus 2 by RT PCR (hospital order, performed in [facility] hospital lab) Nasopharyngeal Nasopharyngeal Swab     Status: None   Collection Time: 08/27/19  7:19 PM   Specimen: Nasopharyngeal Swab  Result Value Ref Range Status   SARS Coronavirus 2 NEGATIVE NEGATIVE Final    Comment: (NOTE) SARS-CoV-2 target nucleic acids are NOT  DETECTED.  The SARS-CoV-2 RNA is generally detectable in upper and lower respiratory specimens during the acute phase of infection. The lowest concentration of SARS-CoV-2 viral copies this assay can detect is 250 copies / mL. A negative result does not preclude SARS-CoV-2 infection and should not be used as the sole basis for treatment or other patient management decisions.  A negative result may occur with improper specimen collection / handling, submission of specimen other than nasopharyngeal swab, presence of viral mutation(s) within the areas targeted by this assay, and inadequate number of viral copies (<250 copies / mL). A negative result must be combined with clinical observations, patient history, and epidemiological information.  Fact Sheet for Patients:   BoilerBrush.com.cy  Fact Sheet for Healthcare Providers: https://pope.com/  This test is not yet approved or  cleared by the [place] FDA and has been authorized for detection and/or diagnosis of SARS-CoV-2 by FDA under an Emergency Use Authorization (EUA).  This EUA will remain in effect (meaning this test can be used) for the duration of the COVID-19 declaration under Section 564(b)(1) of the Act, 21 U.S.C. section 360bbb-3(b)(1), unless the authorization is terminated or revoked sooner.  Performed at [facility], [address], [place], [place] [address]   Blood culture (routine single)     Status: None   Collection Time: 08/27/19  8:00 PM   Specimen: BLOOD  Result Value Ref Range Status   Specimen Description   Final    BLOOD RIGHT ANTECUBITAL Performed at [facility], [address], [place], [place] [address]    Special Requests   Final    BOTTLES DRAWN AEROBIC AND ANAEROBIC Blood Culture adequate volume Performed at [facility], [address], [place], [place] [address]    Culture   Final     NO GROWTH 5 DAYS Performed at [facility], [address] [place], [place] [address]    Report Status 09/01/2019 FINAL  Final  Culture, blood (single)     Status: None   Collection Time: 08/27/19 10:09 PM   Specimen: BLOOD  Result Value Ref Range Status   Specimen Description   Final    BLOOD RIGHT ANTECUBITAL Performed at [facility], [address], [place], [place] [address]    Special Requests   Final    BOTTLES DRAWN AEROBIC AND ANAEROBIC Blood Culture results may not be optimal due to an inadequate volume of blood received in culture bottles Performed at [facility], [address]  [place], [place] [address]    Culture   Final    NO GROWTH 5 DAYS Performed at [facility], [address] [place], [place] [address]    Report Status 09/02/2019 FINAL  Final  Urine culture     Status: None   Collection Time: 08/28/19  4:17 AM   Specimen: In/Out Cath Urine  Result Value Ref Range Status   Specimen Description   Final    IN/OUT CATH URINE Performed at [facility], [address], [place], [place] [address]    Special Requests   Final    NONE Performed at [facility], [address] [place], [place] [address]    Culture   Final    NO GROWTH Performed at [facility], [address], [place], [place] [address]    Report Status 08/29/2019 FINAL  Final      Radiology Studies: No results found.  Scheduled Meds: . busPIRone  30 mg Oral QHS  . Chlorhexidine Gluconate Cloth  6 each Topical Daily  . dexlansoprazole  60 mg Oral Daily  . feeding supplement (ENSURE ENLIVE)  237 mL Oral BID BM  . insulin aspart  0-9 Units Subcutaneous Q8H  . lipase/protease/amylase  24,000 Units Oral TID AC  . sodium chloride flush  10-40 mL Intracatheter Q12H  . sucralfate  1 g Oral Q6H   Continuous Infusions: . sodium chloride 75 mL/hr at 09/05/19 0400  . meropenem (MERREM) IV 1 g  (09/05/19 1700)  . TPN ADULT (ION) 95 mL/hr at 09/04/19 1739  . TPN ADULT (ION)       LOS: 9 days   Time spent: 29 minutes.  Hughie Closs, MD [facility] [email or web address] 09/05/2019, 11:57 AM

## 2019-09-06 LAB — BASIC METABOLIC PANEL
Anion gap: 6 (ref 5–15)
BUN: 9 mg/dL (ref 6–20)
CO2: 25 mmol/L (ref 22–32)
Calcium: 7.8 mg/dL — ABNORMAL LOW (ref 8.9–10.3)
Chloride: 103 mmol/L (ref 98–111)
Creatinine, Ser: 0.3 mg/dL — ABNORMAL LOW (ref 0.61–1.24)
GFR calc Af Amer: >60 mL/min (ref >60)
GFR calc non Af Amer: >60 mL/min (ref >60)
Glucose, Bld: 117 mg/dL — ABNORMAL HIGH (ref 70–99)
Potassium: 4.1 mmol/L (ref 3.5–5.1)
Sodium: 134 mmol/L — ABNORMAL LOW (ref 135–145)

## 2019-09-06 LAB — CBC WITH DIFFERENTIAL/PLATELET
Abs Immature Granulocytes: 0.06 10*3/uL (ref 0.00–0.07)
Basophils Absolute: 0 10*3/uL (ref 0.0–0.1)
Basophils Relative: 0 %
Eosinophils Absolute: 0.2 10*3/uL (ref 0.0–0.5)
Eosinophils Relative: 3 %
HCT: 23.1 % — ABNORMAL LOW (ref 39.0–52.0)
Hemoglobin: 7.3 g/dL — ABNORMAL LOW (ref 13.0–17.0)
Immature Granulocytes: 1 %
Lymphocytes Relative: 18 %
Lymphs Abs: 1 10*3/uL (ref 0.7–4.0)
MCH: 29.4 pg (ref 26.0–34.0)
MCHC: 31.6 g/dL (ref 30.0–36.0)
MCV: 93.1 fL (ref 80.0–100.0)
Monocytes Absolute: 0.5 10*3/uL (ref 0.1–1.0)
Monocytes Relative: 9 %
Neutro Abs: 3.6 10*3/uL (ref 1.7–7.7)
Neutrophils Relative %: 69 %
Platelets: 230 10*3/uL (ref 150–400)
RBC: 2.48 MIL/uL — ABNORMAL LOW (ref 4.22–5.81)
RDW: 14.3 % (ref 11.5–15.5)
WBC: 5.3 10*3/uL (ref 4.0–10.5)
nRBC: 0 % (ref 0.0–0.2)

## 2019-09-06 LAB — PHOSPHORUS: Phosphorus: 3.9 mg/dL (ref 2.5–4.6)

## 2019-09-06 LAB — GLUCOSE, CAPILLARY
Glucose-Capillary: 109 mg/dL — ABNORMAL HIGH (ref 70–99)
Glucose-Capillary: 111 mg/dL — ABNORMAL HIGH (ref 70–99)
Glucose-Capillary: 118 mg/dL — ABNORMAL HIGH (ref 70–99)

## 2019-09-06 LAB — MAGNESIUM: Magnesium: 1.8 mg/dL (ref 1.7–2.4)

## 2019-09-06 MED ORDER — TRAVASOL 10 % IV SOLN
INTRAVENOUS | Status: AC
  Filled 2019-09-06: qty 1231.2

## 2019-09-06 MED ORDER — MAGNESIUM SULFATE 2 GM/50ML IV SOLN
2.0000 g | Freq: Once | INTRAVENOUS | Status: AC
  Administered 2019-09-06: 2 g via INTRAVENOUS
  Filled 2019-09-06: qty 50

## 2019-09-06 MED ORDER — DIPHENHYDRAMINE HCL 25 MG PO CAPS
25.0000 mg | ORAL_CAPSULE | Freq: Three times a day (TID) | ORAL | Status: DC | PRN
  Administered 2019-09-06 – 2019-09-10 (×10): 25 mg via ORAL
  Filled 2019-09-06 (×11): qty 1

## 2019-09-06 NOTE — Progress Notes (Signed)
    Progress Note   Subjective  Increase in LUQ pain overnight. GERD under better control.    Objective  Vital signs in last 24 hours: Temp:  [97.8 F (36.6 C)-99.4 F (37.4 C)] 99.4 F (37.4 C) (09/04 0654) Pulse Rate:  [80-90] 86 (09/04 0654) Resp:  [20-21] 20 (09/03 2136) BP: (142-149)/(83-95) 142/87 (09/04 0654) SpO2:  [97 %-100 %] 97 % (09/04 0654) Weight:  [76.4 kg] 76.4 kg (09/04 0655) Last BM Date: 09/04/19  General: Alert, well-developed, in NAD Heart:  Regular rate and rhythm; no murmurs Chest: Clear to ascultation bilaterally Abdomen:  Soft, upper abdominal tenderness and nondistended. Normal bowel sounds, without guarding, and without rebound.   Extremities:  Without edema. Neurologic:  Alert and  oriented x4; grossly normal neurologically. Psych:  Alert and cooperative. Normal mood and affect.  Intake/Output from previous day: 09/03 0701 - 09/04 0700 In: 4272.1 [P.O.:240; I.V.:3932.1; IV Piggyback:100] Out: 3200 [Urine:3200] Intake/Output this shift: Total I/O In: 120 [P.O.:120] Out: 400 [Urine:400]  Lab Results: Recent Labs    09/04/19 0327 09/06/19 0325  WBC 5.4 5.3  HGB 7.5* 7.3*  HCT 23.7* 23.1*  PLT 250 230   BMET Recent Labs    09/04/19 0327 09/05/19 0403 09/06/19 0325  NA 133* 135 134*  K 4.2 4.1 4.1  CL 102 104 103  CO2 27 25 25   GLUCOSE 129* 132* 117*  BUN 10 9 9   CREATININE 0.36* 0.33* 0.30*  CALCIUM 7.9* 7.9* 7.8*   LFT Recent Labs    09/04/19 0327  PROT 6.2*  ALBUMIN 1.6*  AST 109*  ALT 104*  ALKPHOS 101  BILITOT 0.2*      Assessment & Recommendations   1. Severe necrotizing pancreatitis with 2 areas of walled off infected necrosis. Increase in LUQ pain. Advised to go slow on po diet today. Continue IV meropenem. Creon ac added yesterday. Repeat CT AP pancreatic protocol on Monday or Tuesday.    2. Elevated LFTs. Etoh liver disease and possible TPN effect. Trend LFTs.   3. GERD. Continue Dexilant and Carafate.      LOS: 10 days   Sayler Mickiewicz T. Sunday MD 09/06/2019, 9:43 AM

## 2019-09-06 NOTE — Progress Notes (Signed)
PROGRESS NOTE  Gary Atkinson  AUQ:[id number] DOB: [birth year] DOA: 08/27/2019 PCP: [facility]   Brief Narrative: Patient is a 49 year old male with history of coronary artery disease status post stenting, recently admitted for acute necrotizing pancreatitis and was discharged on 08/06/2019 presented with persistent abdominal pain.  After discharge, patient has not been able to tolerate the food.  He was complaining of worsening pain on the epigastric area, was vomiting and also diarrhea.    CT abdomen/pelvis done in the emergency department showed acute on chronic pancreatitis with possible infection/organized necrotic tissue with possible obstruction of the splenic vein.  GI consulted.  Started on IV antibiotics, IV fluids.  Started on TPN because of expected prolonged decreased oral intake.  Assessment & Plan: Principal Problem:   Pancreatitis, necrotizing Active Problems:   CAD (coronary artery disease), native coronary artery   Benign essential HTN   Pancreatic necrosis   Acute pancreatitis   Malnutrition of moderate degree  Acute, recurrent alcohol-induced necrotizing pancreatitis: Recently admitted for the management of alcoholic pancreatitis and was discharged on 08/06/2019. CT imaging concerning for walled off necrosis, pancreatic collections, occlusion of the splenic vein..  - Continue meropenem x10-14 days per GI, IV fluids, (PICC placed 8/26) TPN, diet advanced to full liquids, but not likely to progress beyond that at this time. Appreciate GI consult, plans to pursue repeat CT early next week and possible endoscopic necrosectomy.  - Surgery consulted, though no open necrosectomy/debridement currently indicated.  - Continue IV pain medications, antiemetics  - Remain on full liquids for the foreseeable future.  Creon has been added by GI.  Per GI, patient is not ready for discharge yet.  Has slightly more pain in the left lower and middle abdomen.  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  - Continue ASA   HTN: BP stable. - Continue as needed medication for hypertension.  History of alcohol abuse: No evidence of withdrawal during this admission.  - Continue thiamine, folic acid.  - Cessation recommended   Iron deficiency /normocytic anemia: Hemoglobin dropped in the range of 8.  He has been transfused with a unit of PRBC on 08/28/2019.  No history of bleeding, hematochezia, melena. FOBT is negative.  Hemoglobin dropped to 7.5 yesterday.  Will recheck in the morning. - s/p IV iron 8/27. Ferritin 366, Iron 21, 21% sat indicative of active inflammation. - Continue PPI BID.   Hypomagnesemia:  Supplemented and now resolved.  - Continue to monitor.  Hyponatremia: Resolved - Continue to monitor.  Severe protein calorie malnutrition:  - Continue TPN.   Diarrhea: ?Exocrine insufficiency - Continue prn iImodium.  DVT prophylaxis: Heparin Code Status: Full Family Communication: None at bedside Disposition Plan:   Status is: Inpatient  Remains inpatient appropriate because:Persistent severe electrolyte disturbances and IV treatments appropriate due to intensity of illness or inability to take PO  Dispo: The patient is from: Home              Anticipated d/c is to: Home              Anticipated d/c date is: > 3 days              Patient currently is not medically stable to d/c.  Consultants:   GI  General surgery  Procedures:  PICC insertion  Antimicrobials:  Meropenem   Subjective: Seen and examined.  Has slightly more pain on the left midline lower abdomen today.  No nausea.  Tolerating liquid diet.  Objective: Vitals:   09/05/19 1249 09/05/19 2136 09/06/19 0654 09/06/19 0655  BP: (!) 142/83 (!) 149/95 (!) 142/87    Pulse: 80 90 86   Resp: (!) 21 20    Temp: 97.8 F (36.6 C) 98.8 F (37.1 C) 99.4 F (37.4 C)   TempSrc: Oral Oral Oral   SpO2: 100% 99% 97%   Weight:    76.4 kg  Height:        Intake/Output Summary (Last 24 hours) at 09/06/2019 1127 Last data filed at 09/06/2019 0923 Gross per 24 hour  Intake 3585.01 ml  Output 3400 ml  Net 185.01 ml   Filed Weights   09/04/19 0626 09/04/19 1326 09/06/19 0655  Weight: 70.8 kg 70.8 kg 76.4 kg   General exam: Appears calm and comfortable  Respiratory system: Clear to auscultation. Respiratory effort normal. Cardiovascular system: S1 & S2 heard, RRR. No JVD, murmurs, rubs, gallops or clicks. No pedal edema. Gastrointestinal system: Abdomen is nondistended, soft and epigastric, right upper quadrant, left periumbilical tenderness. No organomegaly or masses felt. Normal bowel sounds heard. Central nervous system: Alert and oriented. No focal neurological deficits. Extremities: Symmetric 5 x 5 power. Skin: No rashes, lesions or ulcers.  Psychiatry: Judgement and insight appear normal. Mood & affect appropriate.   Data Reviewed: I have personally reviewed following labs and imaging studies  CBC: Recent Labs  Lab 09/01/19 0354 09/04/19 0327 09/06/19 0325  WBC 4.5 5.4 5.3  NEUTROABS 2.9 3.6 3.6  HGB 8.4* 7.5* 7.3*  HCT 25.8* 23.7* 23.1*  MCV 90.8 91.9 93.1  PLT 296 250 230   Basic Metabolic Panel: Recent Labs  Lab 09/02/19 0423 09/03/19 0345 09/04/19 0327 09/05/19 0403 09/06/19 0325  NA 132* 132* 133* 135 134*  K 4.0 4.2 4.2 4.1 4.1  CL 97* 100 102 104 103  CO2 26 26 27 25 25   GLUCOSE 139* 149* 129* 132* 117*  BUN 7 8 10 9 9   CREATININE 0.44* 0.39* 0.36* 0.33* 0.30*  CALCIUM 8.2* 8.0* 7.9* 7.9* 7.8*  MG 1.6* 1.8 1.9 1.9 1.8  PHOS 4.3 3.9 3.6 3.8 3.9   GFR: Estimated Creatinine Clearance: 116.3 mL/min (A) (by C-G formula based on SCr of 0.3 mg/dL (L)). Liver Function Tests: Recent Labs  Lab 09/01/19 0354 09/02/19 0423  09/03/19 0345 09/04/19 0327  AST 41 54* 130* 109*  ALT 41 48* 94* 104*  ALKPHOS 141* 118 105 101  BILITOT 0.3 0.6 0.4 0.2*  PROT 5.9* 6.0* 6.2* 6.2*  ALBUMIN 1.6* 1.6* 1.6* 1.6*   No results for input(s): LIPASE, AMYLASE in the last 168 hours. No results for input(s): AMMONIA in the last 168 hours. Coagulation Profile: No results for input(s): INR, PROTIME in the last 168 hours. Cardiac Enzymes: No results for input(s): CKTOTAL, CKMB, CKMBINDEX, TROPONINI in the last 168 hours. BNP (last 3 results) No results for input(s): PROBNP in the last 8760 hours. HbA1C: No results for input(s): HGBA1C in the last 72 hours. CBG: Recent Labs  Lab 09/04/19 1645 09/05/19 0034 09/05/19 0729 09/05/19 1609 09/06/19 0007  GLUCAP 114* 124* 119* 117* 109*   Lipid Profile: No results for input(s): CHOL, HDL, LDLCALC, TRIG, CHOLHDL, LDLDIRECT in the last 72 hours. Thyroid Function Tests: No results for input(s): TSH, T4TOTAL, FREET4, T3FREE, THYROIDAB in the last 72 hours. Anemia  Panel: No results for input(s): VITAMINB12, FOLATE, FERRITIN, TIBC, IRON, RETICCTPCT in the last 72 hours. Urine analysis:    Component Value Date/Time   COLORURINE YELLOW 08/28/2019 0417   APPEARANCEUR CLEAR 08/28/2019 0417   LABSPEC 1.005 08/28/2019 0417   PHURINE 5.0 08/28/2019 0417   GLUCOSEU NEGATIVE 08/28/2019 0417   HGBUR NEGATIVE 08/28/2019 0417   BILIRUBINUR NEGATIVE 08/28/2019 0417   KETONESUR 5 (A) 08/28/2019 0417   PROTEINUR NEGATIVE 08/28/2019 0417   NITRITE NEGATIVE 08/28/2019 0417   LEUKOCYTESUR NEGATIVE 08/28/2019 0417   Recent Results (from the past 240 hour(s))  SARS Coronavirus 2 by RT PCR (hospital order, performed in [facility] hospital lab) Nasopharyngeal Nasopharyngeal Swab     Status: None   Collection Time: 08/27/19  7:19 PM   Specimen: Nasopharyngeal Swab  Result Value Ref Range Status   SARS Coronavirus 2 NEGATIVE NEGATIVE Final    Comment: (NOTE) SARS-CoV-2 target nucleic  acids are NOT DETECTED.  The SARS-CoV-2 RNA is generally detectable in upper and lower respiratory specimens during the acute phase of infection. The lowest concentration of SARS-CoV-2 viral copies this assay can detect is 250 copies / mL. A negative result does not preclude SARS-CoV-2 infection and should not be used as the sole basis for treatment or other patient management decisions.  A negative result may occur with improper specimen collection / handling, submission of specimen other than nasopharyngeal swab, presence of viral mutation(s) within the areas targeted by this assay, and inadequate number of viral copies (<250 copies / mL). A negative result must be combined with clinical observations, patient history, and epidemiological information.  Fact Sheet for Patients:   BoilerBrush.com.cy  Fact Sheet for Healthcare Providers: https://pope.com/  This test is not yet approved or  cleared by the [place] FDA and has been authorized for detection and/or diagnosis of SARS-CoV-2 by FDA under an Emergency Use Authorization (EUA).  This EUA will remain in effect (meaning this test can be used) for the duration of the COVID-19 declaration under Section 564(b)(1) of the Act, 21 U.S.C. section 360bbb-3(b)(1), unless the authorization is terminated or revoked sooner.  Performed at [facility], [address] [place], [place] [address]   Blood culture (routine single)     Status: None   Collection Time: 08/27/19  8:00 PM   Specimen: BLOOD  Result Value Ref Range Status   Specimen Description   Final    BLOOD RIGHT ANTECUBITAL Performed at [facility], [address] [place], [place] [address]    Special Requests   Final    BOTTLES DRAWN AEROBIC AND ANAEROBIC Blood Culture adequate volume Performed at [facility], [address], [place], [place] [address]     Culture   Final    NO GROWTH 5 DAYS Performed at [facility], [address] [place], [place] [address]    Report Status 09/01/2019 FINAL  Final  Culture, blood (single)     Status: None   Collection Time: 08/27/19 10:09 PM   Specimen: BLOOD  Result Value Ref Range Status   Specimen Description   Final    BLOOD RIGHT ANTECUBITAL Performed at [facility], [address], [place], [place] [address]    Special Requests   Final    BOTTLES DRAWN AEROBIC AND ANAEROBIC Blood Culture results may not be optimal due to an inadequate volume of blood received in culture bottles Performed at [facility], [address]  [place], [place] [address]    Culture   Final    NO GROWTH 5 DAYS Performed at [facility], [address], [place], [place] [address]    Report Status 09/02/2019 FINAL  Final  Urine culture     Status: None   Collection Time: 08/28/19  4:17 AM   Specimen: In/Out Cath Urine  Result Value Ref Range Status   Specimen Description   Final    IN/OUT CATH URINE Performed at [facility], [address], [place], [place] [address]    Special Requests   Final    NONE Performed at [facility], [address], [place], [place] [address]    Culture   Final    NO GROWTH Performed at [facility], [address] [place], [place] [address]    Report Status 08/29/2019 FINAL  Final      Radiology Studies: No results found.  Scheduled Meds: . busPIRone  30 mg Oral QHS  . Chlorhexidine Gluconate Cloth  6 each Topical Daily  . dexlansoprazole  60 mg Oral Daily  . feeding supplement (ENSURE ENLIVE)  237 mL Oral BID BM  . insulin aspart  0-9 Units Subcutaneous Q8H  . lipase/protease/amylase  24,000 Units Oral TID AC  . sodium chloride flush  10-40 mL Intracatheter Q12H  . sucralfate  1 g Oral Q6H   Continuous Infusions: . sodium chloride 75 mL/hr at 09/06/19 0649  . magnesium  sulfate bolus IVPB    . meropenem (MERREM) IV 1 g (09/06/19 0651)  . TPN ADULT (ION) 95 mL/hr at 09/05/19 1818  . TPN ADULT (ION)       LOS: 10 days   Time spent: 27 minutes.  Hughie Closs, MD [facility] [email or web address] 09/06/2019, 11:27 AM

## 2019-09-06 NOTE — Progress Notes (Signed)
PHARMACY - TOTAL PARENTERAL NUTRITION CONSULT NOTE   Indication: Severe pancreatitis  Patient Measurements: Height: _0  (180.3 cm) Weight: 76.4 kg (168 lb 6.9 oz) IBW/kg (Calculated) : 75.3   Body mass index is 23.49 kg/m.    Assessment:  44 y/oM with PMH significant for CAD, recent admission for necrotizing pancreatitis, discharged from the hospital on 08/06/19. Patient has had worsening epigastric pain and difficulty tolerating PO. Pharmacy consulted to initiate TPN.   Glucose / Insulin: No history DM. CBGs within goal range  (goal 100-150).  - has not required any insulin from [facility] over last 24 hours.   Electrolytes: Na slightly low at 134 (max Na in TPN), Mg (1.8) on lower end of normal. All other lytes WNL including CorrCa Renal: SCr low/stable, BUN WNL LFTs / TGs: AST/ALT (109/104) slightly elevated; Tbili 0.2, Alk Phos WNL. TG 82 (8/27) > 138 (8/30) Prealbumin / albumin: < 5/1.6; both low Intake / Output; MIVF: I/O +1072 mL - MIVF: NS at 75 ml/hr GI Imaging:  -Abd CT 8/25: pancreatic necrosis > 50%, gas noted > suspect infection, no overt cholecystitis, pelvic ascites decreased, hepatic steatosis Surgeries / Procedures:   Central access: 8/26 double lumen PICC placed in PM TPN start date: 8/27  Nutritional Goals: RD recommendations (9/2): Kcal 2200-2400, Protein 110-125 grams/day, fluid >/= 2.2L/day  TPN at goal rate of 95 ml/hr delivers 2225 Kcal, 123 grams protein  Current Nutrition: Full liquid diet, TPN, Ensure Enlive BID (refusing) - 9/3: adv to soft diet; calorie count for 48 hrs  Plan:   Now: -  magnesium sulfate 2gm IV x1   At 1800, continue TPN at goal rate of 95 mL/hr  Electrolytes in TPN:   168mq/L of Na, 5867m/L of K, 67m38mL of Ca, 167m24m of Mag, and 10mm11m of Phos.   Cl:Ac ratio 2:1  Add standard MVI and trace elements to TPN  Continue sensitive SSI to q8h  MIVF per MD: NS at 767ml/52m Monitor TPN labs on Mon/Thurs  BMET, phos, Mag  on 9/5  Demico Ploch, Lynelle DoctormD 09/06/19 8:31 AM

## 2019-09-07 LAB — MAGNESIUM: Magnesium: 2 mg/dL (ref 1.7–2.4)

## 2019-09-07 LAB — COMPREHENSIVE METABOLIC PANEL
ALT: 80 U/L — ABNORMAL HIGH (ref 0–44)
AST: 52 U/L — ABNORMAL HIGH (ref 15–41)
Albumin: 1.7 g/dL — ABNORMAL LOW (ref 3.5–5.0)
Alkaline Phosphatase: 92 U/L (ref 38–126)
Anion gap: 7 (ref 5–15)
BUN: 10 mg/dL (ref 6–20)
CO2: 26 mmol/L (ref 22–32)
Calcium: 8 mg/dL — ABNORMAL LOW (ref 8.9–10.3)
Chloride: 103 mmol/L (ref 98–111)
Creatinine, Ser: 0.37 mg/dL — ABNORMAL LOW (ref 0.61–1.24)
GFR calc Af Amer: >60 mL/min (ref >60)
GFR calc non Af Amer: >60 mL/min (ref >60)
Glucose, Bld: 141 mg/dL — ABNORMAL HIGH (ref 70–99)
Potassium: 3.9 mmol/L (ref 3.5–5.1)
Sodium: 136 mmol/L (ref 135–145)
Total Bilirubin: 0.3 mg/dL (ref 0.3–1.2)
Total Protein: 6 g/dL — ABNORMAL LOW (ref 6.5–8.1)

## 2019-09-07 LAB — GLUCOSE, CAPILLARY
Glucose-Capillary: 118 mg/dL — ABNORMAL HIGH (ref 70–99)
Glucose-Capillary: 130 mg/dL — ABNORMAL HIGH (ref 70–99)

## 2019-09-07 LAB — PHOSPHORUS: Phosphorus: 3.8 mg/dL (ref 2.5–4.6)

## 2019-09-07 MED ORDER — TRAVASOL 10 % IV SOLN
INTRAVENOUS | Status: AC
  Filled 2019-09-07: qty 1231.2

## 2019-09-07 NOTE — Progress Notes (Signed)
PROGRESS NOTE  Gary Atkinson  ZOX:[id number] DOB: 1970/03/05 DOA: 08/27/2019 PCP: [facility]   Brief Narrative: Patient is a 49 year old male with history of coronary artery disease status post stenting, recently admitted for acute necrotizing pancreatitis and was discharged on 08/06/2019 presented with persistent abdominal pain.  After discharge, patient has not been able to tolerate the food.  He was complaining of worsening pain on the epigastric area, was vomiting and also diarrhea.    CT abdomen/pelvis done in the emergency department showed acute on chronic pancreatitis with possible infection/organized necrotic tissue with possible obstruction of the splenic vein.  GI consulted.  Started on IV antibiotics, IV fluids.  Started on TPN because of expected prolonged decreased oral intake.  Assessment & Plan: Principal Problem:   Pancreatitis, necrotizing Active Problems:   CAD (coronary artery disease), native coronary artery   Benign essential HTN   Pancreatic necrosis   Acute pancreatitis   Malnutrition of moderate degree  Acute, recurrent alcohol-induced necrotizing pancreatitis: Recently admitted for the management of alcoholic pancreatitis and was discharged on 08/06/2019. CT imaging concerning for walled off necrosis, pancreatic collections, occlusion of the splenic vein..  - Continue meropenem x10-14 days per GI, IV fluids, (PICC placed 8/26) TPN, diet advanced to full liquids, but not likely to progress beyond that at this time. Appreciate GI consult, plans to pursue repeat CT early next week and possible endoscopic necrosectomy.  - Surgery consulted, though no open necrosectomy/debridement currently indicated.  - Continue IV pain medications, antiemetics  -Now tolerating soft diet.  Creon has been added by GI.  Per GI, patient is not ready for discharge yet.  Abdomen pain and tenderness has improved compared to yesterday.  Splenic vein occlusion: CT also showed a  splenic vein occlusion with narrowing of the main portal vein with upper abdominal vein collaterals - Not on anticoagulation with increased risk of GI bleed secondary to gastric varices  CAD: s/p PCI.  - Continue ASA   HTN: BP stable. - Continue as needed medication for hypertension.  History of alcohol abuse: No evidence of withdrawal during this admission.  - Continue thiamine, folic acid.  - Cessation recommended   Iron deficiency /normocytic anemia: Hemoglobin dropped in the range of 8.  He has been transfused with a unit of PRBC on 08/28/2019.  No history of bleeding, hematochezia, melena. FOBT is negative.  Hemoglobin dropped to 7.5 yesterday.  Will recheck in the morning. - s/p IV iron 8/27. Ferritin 366, Iron 21, 21% sat indicative of active inflammation. - Continue PPI BID.   Hypomagnesemia:  Supplemented and now resolved.  - Continue to monitor.  Hyponatremia: Resolved - Continue to monitor.  Severe protein calorie malnutrition:  - Continue TPN.   Diarrhea: ?Exocrine insufficiency - Continue prn iImodium.  DVT prophylaxis: Heparin Code Status: Full Family Communication: None at bedside Disposition Plan:   Status is: Inpatient  Remains inpatient appropriate because:Persistent severe electrolyte disturbances and IV treatments appropriate due to intensity of illness or inability to take PO  Dispo: The patient is from: Home              Anticipated d/c is to: Home              Anticipated d/c date is: 09/09/2019              Patient currently is not medically stable to d/c.  Consultants:   GI  General surgery  Procedures:   PICC insertion  Antimicrobials:  Meropenem  Subjective: Seen and examined.  Abdominal pain much better than yesterday.  Tolerating soft diet.  No new complaint.  Objective: Vitals:   09/06/19 0655 09/06/19 1309 09/06/19 2042 09/07/19 0537  BP:  (!) 146/87 (!) 149/95 (!) 145/82  Pulse:  79 79 83  Resp:  18 19 18   Temp:   98.4 F (36.9 C) 98.2 F (36.8 C) 98.3 F (36.8 C)  TempSrc:   Oral Oral  SpO2:  100% 99% 97%  Weight: 76.4 kg   75.5 kg  Height:        Intake/Output Summary (Last 24 hours) at 09/07/2019 1206 Last data filed at 09/07/2019 1008 Gross per 24 hour  Intake 5104.83 ml  Output 4150 ml  Net 954.83 ml   Filed Weights   09/04/19 1326 09/06/19 0655 09/07/19 0537  Weight: 70.8 kg 76.4 kg 75.5 kg   General exam: Appears calm and comfortable  Respiratory system: Clear to auscultation. Respiratory effort normal. Cardiovascular system: S1 & S2 heard, RRR. No JVD, murmurs, rubs, gallops or clicks. No pedal edema. Gastrointestinal system: Abdomen is nondistended, soft and minimal epigastric tenderness. No organomegaly or masses felt. Normal bowel sounds heard. Central nervous system: Alert and oriented. No focal neurological deficits. Extremities: Symmetric 5 x 5 power. Skin: No rashes, lesions or ulcers.  Psychiatry: Judgement and insight appear normal. Mood & affect appropriate.    Data Reviewed: I have personally reviewed following labs and imaging studies  CBC: Recent Labs  Lab 09/01/19 0354 09/04/19 0327 09/06/19 0325  WBC 4.5 5.4 5.3  NEUTROABS 2.9 3.6 3.6  HGB 8.4* 7.5* 7.3*  HCT 25.8* 23.7* 23.1*  MCV 90.8 91.9 93.1  PLT 296 250 230   Basic Metabolic Panel: Recent Labs  Lab 09/03/19 0345 09/04/19 0327 09/05/19 0403 09/06/19 0325 09/07/19 0330  NA 132* 133* 135 134* 136  K 4.2 4.2 4.1 4.1 3.9  CL 100 102 104 103 103  CO2 26 27 25 25 26   GLUCOSE 149* 129* 132* 117* 141*  BUN 8 10 9 9 10   CREATININE 0.39* 0.36* 0.33* 0.30* 0.37*  CALCIUM 8.0* 7.9* 7.9* 7.8* 8.0*  MG 1.8 1.9 1.9 1.8 2.0  PHOS 3.9 3.6 3.8 3.9 3.8   GFR: Estimated Creatinine Clearance: 116.3 mL/min (A) (by C-G formula based on SCr of 0.37 mg/dL (L)). Liver Function Tests: Recent Labs  Lab 09/01/19 0354 09/02/19 0423 09/03/19 0345 09/04/19 0327 09/07/19 0330  AST 41 54* 130* 109* 52*  ALT 41  48* 94* 104* 80*  ALKPHOS 141* 118 105 101 92  BILITOT 0.3 0.6 0.4 0.2* 0.3  PROT 5.9* 6.0* 6.2* 6.2* 6.0*  ALBUMIN 1.6* 1.6* 1.6* 1.6* 1.7*   No results for input(s): LIPASE, AMYLASE in the last 168 hours. No results for input(s): AMMONIA in the last 168 hours. Coagulation Profile: No results for input(s): INR, PROTIME in the last 168 hours. Cardiac Enzymes: No results for input(s): CKTOTAL, CKMB, CKMBINDEX, TROPONINI in the last 168 hours. BNP (last 3 results) No results for input(s): PROBNP in the last 8760 hours. HbA1C: No results for input(s): HGBA1C in the last 72 hours. CBG: Recent Labs  Lab 09/06/19 0007 09/06/19 1132 09/06/19 1831 09/07/19 0006 09/07/19 1108  GLUCAP 109* 111* 118* 118* 130*   Lipid Profile: No results for input(s): CHOL, HDL, LDLCALC, TRIG, CHOLHDL, LDLDIRECT in the last 72 hours. Thyroid Function Tests: No results for input(s): TSH, T4TOTAL, FREET4, T3FREE, THYROIDAB in the last 72 hours. Anemia Panel: No results for input(s): VITAMINB12, FOLATE,  FERRITIN, TIBC, IRON, RETICCTPCT in the last 72 hours. Urine analysis:    Component Value Date/Time   COLORURINE YELLOW 08/28/2019 0417   APPEARANCEUR CLEAR 08/28/2019 0417   LABSPEC 1.005 08/28/2019 0417   PHURINE 5.0 08/28/2019 0417   GLUCOSEU NEGATIVE 08/28/2019 0417   HGBUR NEGATIVE 08/28/2019 0417   BILIRUBINUR NEGATIVE 08/28/2019 0417   KETONESUR 5 (A) 08/28/2019 0417   PROTEINUR NEGATIVE 08/28/2019 0417   NITRITE NEGATIVE 08/28/2019 0417   LEUKOCYTESUR NEGATIVE 08/28/2019 0417   No results found for this or any previous visit (from the past 240 hour(s)).    Radiology Studies: No results found.  Scheduled Meds: . busPIRone  30 mg Oral QHS  . Chlorhexidine Gluconate Cloth  6 each Topical Daily  . dexlansoprazole  60 mg Oral Daily  . feeding supplement (ENSURE ENLIVE)  237 mL Oral BID BM  . lipase/protease/amylase  24,000 Units Oral TID AC  . sodium chloride flush  10-40 mL  Intracatheter Q12H  . sucralfate  1 g Oral Q6H   Continuous Infusions: . sodium chloride 75 mL/hr at 09/06/19 2312  . meropenem (MERREM) IV 1 g (09/07/19 0552)  . TPN ADULT (ION) 95 mL/hr at 09/06/19 1718  . TPN ADULT (ION)       LOS: 11 days   Time spent: 26 minutes.  Hughie Closs, MD [facility] [email or web address] 09/07/2019, 12:06 PM

## 2019-09-07 NOTE — Progress Notes (Signed)
PHARMACY - TOTAL PARENTERAL NUTRITION CONSULT NOTE   Indication: Severe pancreatitis  Patient Measurements: Height: _0  (180.3 cm) Weight: 75.5 kg (166 lb 7.2 oz) IBW/kg (Calculated) : 75.3   Body mass index is 23.21 kg/m.    Assessment:  16 y/oM with PMH significant for CAD, recent admission for necrotizing pancreatitis, discharged from the hospital on 08/06/19. Patient has had worsening epigastric pain and difficulty tolerating PO. Pharmacy consulted to initiate TPN.   Glucose / Insulin: No history DM. CBGs within goal range  (goal 100-150).  - has not required any insulin from SSI over last 24 hours.   Electrolytes: Na 136 (max Na in TPN)l All other lytes WNL including CorrCa Renal: SCr low/stable, BUN WNL LFTs / TGs: AST/ALT done 52/80; Tbili 0.3, Alk Phos WNL. TG 82 (8/27) > 138 (8/30) Prealbumin / albumin: < 5/1.6; both low Intake / Output; MIVF: I/O -914 mL - MIVF: NS at 75 ml/hr GI Imaging:  -Abd CT 8/25: pancreatic necrosis > 50%, gas noted > suspect infection, no overt cholecystitis, pelvic ascites decreased, hepatic steatosis Surgeries / Procedures:   Central access: 8/26 double lumen PICC placed in PM TPN start date: 8/27  Nutritional Goals: RD recommendations (9/2): Kcal 2200-2400, Protein 110-125 grams/day, fluid >/= 2.2L/day  TPN at goal rate of 95 ml/hr delivers 2225 Kcal, 123 grams protein  Current Nutrition: Full liquid diet, TPN, Ensure Enlive BID (refusing) - 9/3: adv to soft diet; calorie count for 48 hrs  Plan:   At 1800, continue TPN at goal rate of 95 mL/hr  Electrolytes in TPN:   181mq/L of Na, 536m/L of K, 60m47mL of Ca, 160m47m of Mag, and 10mm24m of Phos.   Cl:Ac ratio 2:1  Add standard MVI and trace elements to TPN  D/c sensitive SSI to q8h on 9/5  MIVF per MD: NS at 760ml/50m Monitor TPN labs on Mon/Thurs  Dariel Betzer, Lynelle DoctormD 09/07/19 8:09 AM

## 2019-09-07 NOTE — Progress Notes (Addendum)
    Progress Note   Subjective  Abdominal pain improved. Taking small amounts of food PO. Finds much of the food available is not appetizing. One loose stool per day.    Objective  Vital signs in last 24 hours: Temp:  [98.2 F (36.8 C)-98.4 F (36.9 C)] 98.3 F (36.8 C) (09/05 0537) Pulse Rate:  [79-83] 83 (09/05 0537) Resp:  [18-19] 18 (09/05 0537) BP: (145-149)/(82-95) 145/82 (09/05 0537) SpO2:  [97 %-100 %] 97 % (09/05 0537) Weight:  [75.5 kg] 75.5 kg (09/05 0537) Last BM Date: 09/05/19  General: Alert, well-developed, in NAD Heart:  Regular rate and rhythm; no murmurs Chest: Clear to ascultation bilaterally Abdomen:  Soft, nontender and nondistended. Normal bowel sounds, without guarding, and without rebound.   Extremities:  Without edema. Neurologic:  Alert and  oriented x4; grossly normal neurologically. Psych:  Alert and cooperative. Normal mood and affect.  Intake/Output from previous day: 09/04 0701 - 09/05 0700 In: 5224.8 [P.O.:1040; I.V.:3734.8; IV Piggyback:450] Out: 3800 [Urine:3800] Intake/Output this shift: No intake/output data recorded.  Lab Results: Recent Labs    09/06/19 0325  WBC 5.3  HGB 7.3*  HCT 23.1*  PLT 230   BMET Recent Labs    09/05/19 0403 09/06/19 0325 09/07/19 0330  NA 135 134* 136  K 4.1 4.1 3.9  CL 104 103 103  CO2 25 25 26   GLUCOSE 132* 117* 141*  BUN 9 9 10   CREATININE 0.33* 0.30* 0.37*  CALCIUM 7.9* 7.8* 8.0*   LFT Recent Labs    09/07/19 0330  PROT 6.0*  ALBUMIN 1.7*  AST 52*  ALT 80*  ALKPHOS 92  BILITOT 0.3      Assessment & Recommendations   1. Severe, necrotizing pancreatitis with 2 areas of walled off infected necrosis. Advance diet as tolerated. Continue IV meropenem. Continue Creon ac. Repeat pancreatic protocol CT on Tuesday.  2. Elevated LFTs improved. Monitor.   3. Malnutrition. Continue TPN and encourage oral intake.   4. Anemia. Hgb is slowly drifting downward. Monitor.   5. GERD.  Symptoms controled on Dexilant and Carafae.     LOS: 11 days   Delvin Hedeen T. 11/07/19 MD 09/07/2019, 9:39 AM

## 2019-09-08 LAB — CBC
HCT: 23.3 % — ABNORMAL LOW (ref 39.0–52.0)
Hemoglobin: 7.7 g/dL — ABNORMAL LOW (ref 13.0–17.0)
MCH: 30.1 pg (ref 26.0–34.0)
MCHC: 33 g/dL (ref 30.0–36.0)
MCV: 91 fL (ref 80.0–100.0)
Platelets: 281 10*3/uL (ref 150–400)
RBC: 2.56 MIL/uL — ABNORMAL LOW (ref 4.22–5.81)
RDW: 14.5 % (ref 11.5–15.5)
WBC: 6.7 10*3/uL (ref 4.0–10.5)
nRBC: 0 % (ref 0.0–0.2)

## 2019-09-08 LAB — COMPREHENSIVE METABOLIC PANEL
ALT: 63 U/L — ABNORMAL HIGH (ref 0–44)
AST: 36 U/L (ref 15–41)
Albumin: 1.6 g/dL — ABNORMAL LOW (ref 3.5–5.0)
Alkaline Phosphatase: 85 U/L (ref 38–126)
Anion gap: 6 (ref 5–15)
BUN: 13 mg/dL (ref 6–20)
CO2: 26 mmol/L (ref 22–32)
Calcium: 7.9 mg/dL — ABNORMAL LOW (ref 8.9–10.3)
Chloride: 100 mmol/L (ref 98–111)
Creatinine, Ser: 0.4 mg/dL — ABNORMAL LOW (ref 0.61–1.24)
GFR calc Af Amer: >60 mL/min (ref >60)
GFR calc non Af Amer: >60 mL/min (ref >60)
Glucose, Bld: 129 mg/dL — ABNORMAL HIGH (ref 70–99)
Potassium: 4.1 mmol/L (ref 3.5–5.1)
Sodium: 132 mmol/L — ABNORMAL LOW (ref 135–145)
Total Bilirubin: 0.3 mg/dL (ref 0.3–1.2)
Total Protein: 6.1 g/dL — ABNORMAL LOW (ref 6.5–8.1)

## 2019-09-08 LAB — GLUCOSE, CAPILLARY
Glucose-Capillary: 111 mg/dL — ABNORMAL HIGH (ref 70–99)
Glucose-Capillary: 124 mg/dL — ABNORMAL HIGH (ref 70–99)

## 2019-09-08 LAB — MAGNESIUM: Magnesium: 1.8 mg/dL (ref 1.7–2.4)

## 2019-09-08 LAB — PREALBUMIN: Prealbumin: 9.6 mg/dL — ABNORMAL LOW (ref 18–38)

## 2019-09-08 LAB — TRIGLYCERIDES: Triglycerides: 107 mg/dL (ref <150)

## 2019-09-08 LAB — PHOSPHORUS: Phosphorus: 4.1 mg/dL (ref 2.5–4.6)

## 2019-09-08 MED ORDER — BENEPROTEIN PO POWD
1.0000 | Freq: Three times a day (TID) | ORAL | Status: DC
  Administered 2019-09-09: 6 g via ORAL
  Filled 2019-09-08 (×3): qty 227

## 2019-09-08 MED ORDER — TRAVASOL 10 % IV SOLN
INTRAVENOUS | Status: AC
  Filled 2019-09-08: qty 1231.2

## 2019-09-08 NOTE — Progress Notes (Signed)
PHARMACY - TOTAL PARENTERAL NUTRITION CONSULT NOTE   Indication: Severe pancreatitis  Patient Measurements: Height: _0  (180.3 cm) Weight: 76.8 kg (169 lb 5 oz) IBW/kg (Calculated) : 75.3   Body mass index is 23.61 kg/m.    Assessment:  48 y/oM with PMH significant for CAD, recent admission for necrotizing pancreatitis, discharged from the hospital on 08/06/19. Patient has had worsening epigastric pain and difficulty tolerating PO. Pharmacy consulted to initiate TPN.   Glucose / Insulin: No history DM. CBGs within goal range  (goal 100-150).  - has not required any insulin from SSI over last 24 hours, discontinued SSI on 9/5   Electrolytes: Na 132 (max Na in TPN)l All other lytes WNL including CorrCa Renal: SCr low/stable, BUN WNL LFTs / TGs: AST/ALT done 52/80; Tbili 0.3, Alk Phos WNL. TG 82 (8/27) > 138 (8/30) Prealbumin / albumin: < 5/1.6; both low Intake / Output; MIVF: I/O -914 mL - MIVF: NS at 75 ml/hr GI Imaging:  -Abd CT 8/25: pancreatic necrosis > 50%, gas noted > suspect infection, no overt cholecystitis, pelvic ascites decreased, hepatic steatosis Surgeries / Procedures:   Central access: 8/26 double lumen PICC placed in PM TPN start date: 8/27  Nutritional Goals: RD recommendations (9/2): Kcal 2200-2400, Protein 110-125 grams/day, fluid >/= 2.2L/day  TPN at goal rate of 95 ml/hr delivers 2225 Kcal, 123 grams protein  Current Nutrition: Full liquid diet, TPN, Ensure Enlive BID (refused all doses) - 9/3: adv to soft diet; calorie count for 48 hrs  Plan:   At 1800, continue TPN at goal rate of 95 mL/hr  Electrolytes in TPN:   144mq/L of Na, 550m/L of K, 29m629mL of Ca, 129m6m of Mag, and 10mm31m of Phos.   Cl:Ac ratio 2:1  Add standard MVI and trace elements to TPN  MIVF per MD: NS at 729ml/10m Monitor TPN labs on Mon/Thurs  Mayerli Kirst,Minda DittomD 09/08/19 9:12 AM

## 2019-09-08 NOTE — Progress Notes (Signed)
PROGRESS NOTE  Gary Atkinson  WER:[id number] DOB: 1970/11/06 DOA: 08/27/2019 PCP: [facility]   Brief Narrative: Patient is a 49 year old male with history of coronary artery disease status post stenting, recently admitted for acute necrotizing pancreatitis and was discharged on 08/06/2019 presented with persistent abdominal pain.  After discharge, patient has not been able to tolerate the food.  He was complaining of worsening pain on the epigastric area, was vomiting and also diarrhea.    CT abdomen/pelvis done in the emergency department showed acute on chronic pancreatitis with possible infection/organized necrotic tissue with possible obstruction of the splenic vein.  GI consulted.  Started on IV antibiotics, IV fluids.  Started on TPN because of expected prolonged decreased oral intake.  Assessment & Plan: Principal Problem:   Pancreatitis, necrotizing Active Problems:   CAD (coronary artery disease), native coronary artery   Benign essential HTN   Pancreatic necrosis   Acute pancreatitis   Malnutrition of moderate degree  Acute, recurrent alcohol-induced necrotizing pancreatitis: Recently admitted for the management of alcoholic pancreatitis and was discharged on 08/06/2019. CT imaging concerning for walled off necrosis, pancreatic collections, occlusion of the splenic vein..  - Continue meropenem x10-14 days per GI, IV fluids, (PICC placed 8/26) TPN, diet advanced to full liquids, but not likely to progress beyond that at this time. Appreciate GI consult, plans to pursue repeat CT early next week and possible endoscopic necrosectomy.  - Surgery consulted, though no open necrosectomy/debridement currently indicated.  - Continue IV pain medications, antiemetics  -Now tolerating soft diet.  Creon has been added by GI.  Abdomen pain and tenderness has improved compared to yesterday.  He is scheduled to have CT abdomen, pancreatic protocol tomorrow.  GI has touched base with  pharmacy to wean off TPN now that he is tolerating diet.  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  - Continue ASA   HTN: BP stable. - Continue as needed medication for hypertension.  History of alcohol abuse: No evidence of withdrawal during this admission.  - Continue thiamine, folic acid.  - Cessation recommended   Iron deficiency /normocytic anemia: Hemoglobin dropped in the range of 8.  He has been transfused with a unit of PRBC on 08/28/2019.  No history of bleeding, hematochezia, melena. FOBT is negative.  Hemoglobin dropped to 7.7 today.   - s/p IV iron 8/27. Ferritin 366, Iron 21, 21% sat indicative of active inflammation. - Continue PPI BID.   Hypomagnesemia:  Supplemented and now resolved.  - Continue to monitor.  Hyponatremia: Resolved - Continue to monitor.  Severe protein calorie malnutrition:  - Continue TPN.   Diarrhea: ?Exocrine insufficiency - Continue prn iImodium.  DVT prophylaxis: Heparin Code Status: Full Family Communication: None at bedside Disposition Plan:   Status is: Inpatient  Remains inpatient appropriate because:Persistent severe electrolyte disturbances and IV treatments appropriate due to intensity of illness or inability to take PO  Dispo: The patient is from: Home              Anticipated d/c is to: Home              Anticipated d/c date is: 09/09/2019              Patient currently is not medically stable to d/c.  Consultants:   GI  General surgery  Procedures:   PICC insertion  Antimicrobials:  Meropenem   Subjective: Seen and examined.  Doing well.  Could not tolerate solid yesterday.  No abdominal pain or any other complaint.  Objective: Vitals:   09/07/19 1255 09/07/19 2106 09/08/19 0552 09/08/19 0553  BP: (!)  149/86 (!) 150/98 (!) 155/98 (!) 155/98  Pulse: 71 87 93 93  Resp: 18 (!) 24 18 18   Temp: 98.4 F (36.9 C) 99.8 F (37.7 C) 100.1 F (37.8 C) 100.1 F (37.8 C)  TempSrc:  Oral Oral Oral  SpO2: 98% 98% 96%   Weight:    76.8 kg  Height:        Intake/Output Summary (Last 24 hours) at 09/08/2019 1118 Last data filed at 09/08/2019 0924 Gross per 24 hour  Intake 3638.63 ml  Output 2450 ml  Net 1188.63 ml   Filed Weights   09/06/19 0655 09/07/19 0537 09/08/19 0553  Weight: 76.4 kg 75.5 kg 76.8 kg   General exam: Appears calm and comfortable  Respiratory system: Clear to auscultation. Respiratory effort normal. Cardiovascular system: S1 & S2 heard, RRR. No JVD, murmurs, rubs, gallops or clicks. No pedal edema. Gastrointestinal system: Abdomen is nondistended, soft and nontender. No organomegaly or masses felt. Normal bowel sounds heard. Central nervous system: Alert and oriented. No focal neurological deficits. Extremities: Symmetric 5 x 5 power. Skin: No rashes, lesions or ulcers.  Psychiatry: Judgement and insight appear normal. Mood & affect appropriate.   Data Reviewed: I have personally reviewed following labs and imaging studies  CBC: Recent Labs  Lab 09/04/19 0327 09/06/19 0325 09/08/19 0415  WBC 5.4 5.3 6.7  NEUTROABS 3.6 3.6  --   HGB 7.5* 7.3* 7.7*  HCT 23.7* 23.1* 23.3*  MCV 91.9 93.1 91.0  PLT 250 230 281   Basic Metabolic Panel: Recent Labs  Lab 09/04/19 0327 09/05/19 0403 09/06/19 0325 09/07/19 0330 09/08/19 0415 09/08/19 0816  NA 133* 135 134* 136  --  132*  K 4.2 4.1 4.1 3.9  --  4.1  CL 102 104 103 103  --  100  CO2 27 25 25 26   --  26  GLUCOSE 129* 132* 117* 141*  --  129*  BUN 10 9 9 10   --  13  CREATININE 0.36* 0.33* 0.30* 0.37*  --  0.40*  CALCIUM 7.9* 7.9* 7.8* 8.0*  --  7.9*  MG 1.9 1.9 1.8 2.0 1.8  --   PHOS 3.6 3.8 3.9 3.8 4.1  --    GFR: Estimated Creatinine Clearance: 116.3 mL/min (A) (by C-G formula based on SCr of 0.4 mg/dL  (L)). Liver Function Tests: Recent Labs  Lab 09/02/19 0423 09/03/19 0345 09/04/19 0327 09/07/19 0330 09/08/19 0816  AST 54* 130* 109* 52* 36  ALT 48* 94* 104* 80* 63*  ALKPHOS 118 105 101 92 85  BILITOT 0.6 0.4 0.2* 0.3 0.3  PROT 6.0* 6.2* 6.2* 6.0* 6.1*  ALBUMIN 1.6* 1.6* 1.6* 1.7* 1.6*   No results for input(s): LIPASE, AMYLASE in the last 168 hours. No results for input(s): AMMONIA in the last 168 hours. Coagulation Profile: No results for input(s): INR, PROTIME in the last 168 hours. Cardiac Enzymes: No results for input(s): CKTOTAL, CKMB, CKMBINDEX, TROPONINI in the last 168 hours. BNP (last 3 results) No results for input(s): PROBNP in the last 8760 hours. HbA1C: No results for input(s): HGBA1C in the last 72 hours. CBG: Recent Labs  Lab 09/06/19 1831 09/07/19 0006 09/07/19 1108 09/08/19 0000 09/08/19 11/07/19  GLUCAP 118* 118* 130* 111* 124*   Lipid Profile: Recent Labs    09/08/19 0415  TRIG 107   Thyroid Function Tests: No results for input(s): TSH, T4TOTAL, FREET4, T3FREE, THYROIDAB in the last 72 hours. Anemia Panel: No results for input(s): VITAMINB12, FOLATE, FERRITIN, TIBC, IRON, RETICCTPCT in the last 72 hours. Urine analysis:    Component Value Date/Time   COLORURINE YELLOW 08/28/2019 0417   APPEARANCEUR CLEAR 08/28/2019 0417   LABSPEC 1.005 08/28/2019 0417   PHURINE 5.0 08/28/2019 0417   GLUCOSEU NEGATIVE 08/28/2019 0417   HGBUR NEGATIVE 08/28/2019 0417   BILIRUBINUR NEGATIVE 08/28/2019 0417   KETONESUR 5 (A) 08/28/2019 0417   PROTEINUR NEGATIVE 08/28/2019 0417   NITRITE NEGATIVE 08/28/2019 0417   LEUKOCYTESUR NEGATIVE 08/28/2019 0417   No results found for this or any previous visit (from the past 240 hour(s)).    Radiology Studies: No results found.  Scheduled Meds: . busPIRone  30 mg Oral QHS  . Chlorhexidine Gluconate Cloth  6 each Topical Daily  . dexlansoprazole  60 mg Oral Daily  . feeding supplement (ENSURE ENLIVE)  237 mL  Oral BID BM  . lipase/protease/amylase  24,000 Units Oral TID AC  . sodium chloride flush  10-40 mL Intracatheter Q12H  . sucralfate  1 g Oral Q6H   Continuous Infusions: . sodium chloride 75 mL/hr at 09/08/19 0555  . meropenem (MERREM) IV 1 g (09/08/19 0517)  . TPN ADULT (ION) 95 mL/hr at 09/08/19 0555  . TPN ADULT (ION)       LOS: 12 days   Time spent: 25 minutes.  Hughie Closs, MD [facility] [email or web address] 09/08/2019, 11:18 AM

## 2019-09-08 NOTE — Progress Notes (Signed)
Nutrition Follow-up  DOCUMENTATION CODES:   Non-severe (moderate) malnutrition in context of acute illness/injury  INTERVENTION:   -TPN management per Pharmacy -D/c Ensure -Beneprotein powder TID with meals, each provides 25 kcals and 6g protein  NUTRITION DIAGNOSIS:   Moderate Malnutrition related to acute illness (necrotizing pancreatitis) as evidenced by energy intake < 75% for > 7 days, mild muscle depletion, mild fat depletion, percent weight loss, edema.  Ongoing.  GOAL:   Patient will meet greater than or equal to 90% of their needs  Not meeting.  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 PMH includes CAD s/p stenting 10 years ago, anxiety, depression, EtOH abuse, HTN.  48 hour Calorie count was ordered on 9/3.  No envelope on door. Results from documentation in flowsheets.  Calorie Count results:  9/3: no documentation  9/4  B: 300 kcals, 12g protein L: refused tray, 110 kcals (from Sprite) D: did not order dinner tray, 75 kcals, 1 g protein (from broth w/ crackers)  9/5 B: nothing ordered, 110 kcals (from Sprite) L: 400 kcals, 23g protein D: nothing ordered  Patient is not drinking Ensure supplements. Pt did not order b-fast this morning and lunch is consisting of soup and a banana.   TPN continuing at 95 ml/hr, providing 2225 kcals and 123g protein.  Admission weight: 152 lbs Current weight: 169 lbs  Medications: Carafate,  Creon  Diet Order:   Diet Order            DIET SOFT Room service appropriate? Yes; Fluid consistency: Thin  Diet effective now                 EDUCATION NEEDS:   No education needs have been identified at this time  Skin:  Skin  Assessment: Reviewed RN Assessment  Last BM:  9/4  Height:   Ht Readings from Last 1 Encounters:  08/28/19 5\' 11"  (1.803 m)    Weight:   Wt Readings from Last 1 Encounters:  09/08/19 76.8 kg   BMI:  Body mass index is 23.61 kg/m.  Estimated Nutritional Needs:   Kcal:  2200-2400  Protein:  110-125 grams  Fluid:  >/=2.2L/d  11/08/19, MS, RD, LDN Inpatient Clinical Dietitian Contact information available via Amion

## 2019-09-08 NOTE — Progress Notes (Addendum)
Patient ID: Gary Atkinson, male   DOB: 1970/12/22, 49 y.o.   MRN: [id number]    Progress Note   Subjective   Day # 12 CC necrotizing pancreatitis  Meropenem #11 TPN  Today's labs-hemoglobin 7.7/hematocrit 23.3/WBC 6.7 Prealbumin 9.6  Patient continues to improve, he has been tolerating solid food over the past 24 hours and able to eat most of his tray. No fever. Reflux controlled on Dexilant No complaints of diarrhea   Objective   Vital signs in last 24 hours: Temp:  [98.4 F (36.9 C)-100.1 F (37.8 C)] 100.1 F (37.8 C) (09/06 0553) Pulse Rate:  [71-93] 93 (09/06 0553) Resp:  [18-24] 18 (09/06 0553) BP: (149-155)/(86-98) 155/98 (09/06 0553) SpO2:  [96 %-98 %] 96 % (09/06 0552) Weight:  [76.8 kg] 76.8 kg (09/06 0553) Last BM Date: 09/06/19 General:    white male  in NAD Heart:  Regular rate and rhythm; no murmurs Lungs: Respirations even and unlabored, lungs CTA bilaterally Abdomen:  Soft, mild tenderness across the upper abdomen, no guarding and nondistended. Normal bowel sounds. Extremities:  Without edema. Neurologic:  Alert and oriented,  grossly normal neurologically. Psych:  Cooperative. Normal mood and affect.  Intake/Output from previous day: 09/05 0701 - 09/06 0700 In: 3878.6 [P.O.:720; I.V.:2858.6; IV Piggyback:300] Out: 2500 [Urine:2500] Intake/Output this shift: Total I/O In: -  Out: 400 [Urine:400]  Lab Results: Recent Labs    09/06/19 0325 09/08/19 0415  WBC 5.3 6.7  HGB 7.3* 7.7*  HCT 23.1* 23.3*  PLT 230 281   BMET Recent Labs    09/06/19 0325 09/07/19 0330  NA 134* 136  K 4.1 3.9  CL 103 103  CO2 25 26  GLUCOSE 117* 141*  BUN 9 10  CREATININE 0.30* 0.37*  CALCIUM 7.8* 8.0*   LFT Recent Labs    09/07/19 0330  PROT 6.0*  ALBUMIN 1.7*  AST 52*  ALT 80*  ALKPHOS 92  BILITOT 0.3     Assessment / Plan:    #30 49 year old white male with severe necrotizing pancreatitis, last imaging showing 2 areas of walled off necrosis,  and increased necrosis of the gland.  He has had significant improvement since admission. Completing 14 days of meropenem-day 11 today He is now tolerating solid food, will asked pharmacy to wean TPN over the next 24 hours CT of the abdomen and pelvis/pancreatic protocol scheduled for tomorrow  If CT scan improved, expect patient will be able to be discharged home this coming week.  Possible necrosectomy at some point in the near future, Dr. Meridee Score to determine after reviewing follow-up CT  #2 GERD/refractory-now stable on Dexilant and Carafate #3 mild diarrhea-improved with addition of Creon, continue on discharge #4 malnutrition    LOS: 12 days   Amy Esterwood PA-C 09/08/2019, 8:54 AM     Attending Physician Note   I have taken an interval history, reviewed the chart and examined the patient. I agree with the Advanced Practitioner's note, impression and recommendations.   * Severe necrotizing pancreatitis, resolving. PO intake has improved. Dr. Meridee Score to review CT imaging and clinical course tomorrow for further plans on mgmt of pancreatic necrosis.  * Malnutrition. PO intake has improved. Ask pharmacy about weaning TPN. * GERD. Controlled on Dexliant and Carafate.  * Diarrhea improved. Continue Creon.  Claudette Head, MD [facility]

## 2019-09-09 ENCOUNTER — Inpatient Hospital Stay (HOSPITAL_COMMUNITY): Payer: 59

## 2019-09-09 ENCOUNTER — Encounter (HOSPITAL_COMMUNITY): Payer: Self-pay | Admitting: Internal Medicine

## 2019-09-09 LAB — GLUCOSE, CAPILLARY
Glucose-Capillary: 97 mg/dL (ref 70–99)
Glucose-Capillary: 122 mg/dL — ABNORMAL HIGH (ref 70–99)
Glucose-Capillary: 110 mg/dL — ABNORMAL HIGH (ref 70–99)

## 2019-09-09 MED ORDER — IOHEXOL 300 MG/ML  SOLN
100.0000 mL | Freq: Once | INTRAMUSCULAR | Status: AC | PRN
  Administered 2019-09-09: 100 mL via INTRAVENOUS

## 2019-09-09 MED ORDER — RESOURCE INSTANT PROTEIN PO PWD PACKET
1.0000 | Freq: Three times a day (TID) | ORAL | Status: DC
  Filled 2019-09-09: qty 6

## 2019-09-09 NOTE — Progress Notes (Signed)
Progress Note  Chief Complaint:  Necrotizing pancreatiits       ASSESSMENT / PLAN:    # Severe necrotizing pancreatitis with walled off necrosis.  --Overall his pain is controlled. Requiring Morphine only twice, three at the most times / day.  --On day # 14 of Meropenem.  --On TNA. Calorie count in progress. Patient says he is eating 50-75% of soft diet --At present, no plans for drainage / debridement of WOPN. Will see what today's follow up CT scan shows.      SUBJECTIVE:   Tolerating at least 50 % of soft diet.  No significant abdominal pain today just occasional LUQ discomfort. He didn't sleep well, staff turning on his lights but also anxious about "next step". Tired of being in hospital, wants to go home.    OBJECTIVE:    Scheduled inpatient medications:   busPIRone  30 mg Oral QHS   Chlorhexidine Gluconate Cloth  6 each Topical Daily   dexlansoprazole  60 mg Oral Daily   lipase/protease/amylase  24,000 Units Oral TID AC   protein supplement  1 Scoop Oral TID WC   sodium chloride flush  10-40 mL Intracatheter Q12H   sucralfate  1 g Oral Q6H   Continuous inpatient infusions:   sodium chloride Stopped (09/09/19 0845)   meropenem (MERREM) IV 1 g (09/09/19 0548)   TPN ADULT (ION) 95 mL/hr at 09/08/19 1704   PRN inpatient medications: acetaminophen, clonazePAM, diphenhydrAMINE, labetalol, loperamide, morphine injection, nitroGLYCERIN, ondansetron **OR** ondansetron (ZOFRAN) IV, sodium chloride flush  Vital signs in last 24 hours: Temp:  [98.5 F (36.9 C)-99.4 F (37.4 C)] 98.5 F (36.9 C) (09/07 0551) Pulse Rate:  [85-105] 100 (09/07 0551) Resp:  [16-18] 18 (09/07 0551) BP: (145-159)/(79-99) 145/87 (09/07 0551) SpO2:  [95 %-99 %] 97 % (09/07 0551) Weight:  [78 kg] 78 kg (09/07 0500) Last BM Date: 09/08/19  Intake/Output Summary (Last 24 hours) at 09/09/2019 0923 Last data filed at 09/09/2019 0905 Gross per 24 hour  Intake 5073.71 ml  Output 4660 ml   Net 413.71 ml     Physical Exam:   General: Alert, male in NAD  Heart:  Regular rate and rhythm. No murmur. No lower extremity edema  Pulmonary: Normal respiratory effort. Trace bilateral pedal edema  Abdomen: Soft, nondistended, Nontender. Normal bowel sounds. No masses felt.  Neurologic: Alert and oriented  Psych: Pleasant. Cooperative.   Filed Weights   09/07/19 0537 09/08/19 0553 09/09/19 0500  Weight: 75.5 kg 76.8 kg 78 kg    Intake/Output from previous day: 09/06 0701 - 09/07 0700 In: 4833.7 [P.O.:760; I.V.:3873.7; IV Piggyback:200] Out: 4560 [Urine:4560] Intake/Output this shift: Total I/O In: 240 [P.O.:240] Out: 500 [Urine:500]    Lab Results: Recent Labs    09/08/19 0415  WBC 6.7  HGB 7.7*  HCT 23.3*  PLT 281   BMET Recent Labs    09/07/19 0330 09/08/19 0816  NA 136 132*  K 3.9 4.1  CL 103 100  CO2 26 26  GLUCOSE 141* 129*  BUN 10 13  CREATININE 0.37* 0.40*  CALCIUM 8.0* 7.9*   LFT Recent Labs    09/08/19 0816  PROT 6.1*  ALBUMIN 1.6*  AST 36  ALT 63*  ALKPHOS 85  BILITOT 0.3     Principal Problem:   Pancreatitis, necrotizing Active Problems:   CAD (coronary artery disease), native coronary artery   Benign essential HTN   Pancreatic necrosis   Acute pancreatitis   Malnutrition of moderate  degree     LOS: 13 days   Willette Cluster ,NP 09/09/2019, 9:23 AM

## 2019-09-09 NOTE — Progress Notes (Signed)
PHARMACY - TOTAL PARENTERAL NUTRITION CONSULT NOTE   Indication: Severe pancreatitis  Patient Measurements: Height: 5\' 11"  (180.3 cm) Weight: 78 kg (172 lb) IBW/kg (Calculated) : 75.3   Body mass index is 23.99 kg/m.    Assessment:  84 y/oM with PMH significant for CAD, recent admission for necrotizing pancreatitis, discharged from the hospital on 08/06/19. Patient has had worsening epigastric pain and difficulty tolerating PO. Pharmacy consulted to initiate TPN.   Glucose / Insulin: No history DM. CBGs within goal range  (goal 100-150).   discontinued SSI on 9/5   Electrolytes: no labs today Renal: SCr low/stable, BUN WNL LFTs / TGs: LFTs improved. TG 82 (8/27) > 138 (8/30) Prealbumin / albumin: < 5 up to 9.6/1.6; Intake / Output; MIVF:  - MIVF: NS at 75 ml/hr per MD GI Imaging:  -Abd CT 8/25: pancreatic necrosis > 50%, gas noted > suspect infection, no overt cholecystitis, pelvic ascites decreased, hepatic steatosis Surgeries / Procedures:   Central access: 8/26 double lumen PICC placed in PM TPN start date: 8/27  Nutritional Goals: RD recommendations (9/2): Kcal 2200-2400, Protein 110-125 grams/day, fluid >/= 2.2L/day  TPN at goal rate of 95 ml/hr delivers 2225 Kcal, 123 grams protein  Current Nutrition: soft diet & TPN  Plan:  GI's note 9/6 said "will asked pharmacy to wean TPN over the next 24 hours" but pharmacy was not contacted  Will dc TPN after this bag finished today DC TPN labs IVF per MD Pharmacy to sign off  06-20-2003, Pharm.D 09/09/2019 8:57 AM

## 2019-09-09 NOTE — Progress Notes (Signed)
PROGRESS NOTE  Gary Atkinson  WUJ:[id number] DOB: 17-Nov-1970 DOA: 08/27/2019 PCP: [facility]   Brief Narrative: Patient is a 49 year old male with history of coronary artery disease status post stenting, recently admitted for acute necrotizing pancreatitis and was discharged on 08/06/2019 presented with persistent abdominal pain.  After discharge, patient has not been able to tolerate the food.  He was complaining of worsening pain on the epigastric area, was vomiting and also diarrhea.    CT abdomen/pelvis done in the emergency department showed acute on chronic pancreatitis with possible infection/organized necrotic tissue with possible obstruction of the splenic vein.  GI consulted.  Started on IV antibiotics, IV fluids.  Started on TPN because of expected prolonged decreased oral intake.  Assessment & Plan: Principal Problem:   Pancreatitis, necrotizing Active Problems:   CAD (coronary artery disease), native coronary artery   Benign essential HTN   Pancreatic necrosis   Acute pancreatitis   Malnutrition of moderate degree  Acute, recurrent alcohol-induced necrotizing pancreatitis: Recently admitted for the management of alcoholic pancreatitis and was discharged on 08/06/2019. CT imaging concerning for walled off necrosis, pancreatic collections, occlusion of the splenic vein..  - Continue meropenem x10-14 days per GI, IV fluids, (PICC placed 8/26) TPN, diet advanced to full liquids, but not likely to progress beyond that at this time. Appreciate GI consult, plans to pursue repeat CT early next week and possible endoscopic necrosectomy.  - Surgery consulted, though no open necrosectomy/debridement currently indicated.  - Continue IV pain medications, antiemetics  -Now tolerating soft diet.  Creon has been added by GI.  Has slightly more abdominal pain and tenderness today.  He is scheduled to have CT abdomen, pancreatic protocol today.  TPN weaning off in process by  pharmacy.  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  - Continue ASA   HTN: BP stable. - Continue as needed medication for hypertension.  History of alcohol abuse: No evidence of withdrawal during this admission.  - Continue thiamine, folic acid.  - Cessation recommended   Iron deficiency /normocytic anemia: Hemoglobin dropped in the range of 8.  He has been transfused with a unit of PRBC on 08/28/2019.  No history of bleeding, hematochezia, melena. FOBT is negative.  Hemoglobin dropped to 7.7 today.   - s/p IV iron 8/27. Ferritin 366, Iron 21, 21% sat indicative of active inflammation. - Continue PPI BID.   Hypomagnesemia:  Supplemented and now resolved.  - Continue to monitor.  Hyponatremia: Resolved - Continue to monitor.  Severe protein calorie malnutrition:  - Continue TPN.   Diarrhea: ?Exocrine insufficiency - Continue prn iImodium.  DVT prophylaxis: Heparin Code Status: Full Family Communication: None at bedside Disposition Plan:   Status is: Inpatient  Remains inpatient appropriate because:Persistent severe electrolyte disturbances and IV treatments appropriate due to intensity of illness or inability to take PO  Dispo: The patient is from: Home              Anticipated d/c is to: Home              Anticipated d/c date is: 09/10/2019              Patient currently is not medically stable to d/c.  Consultants:   GI  General surgery  Procedures:   PICC insertion  Antimicrobials:  Meropenem   Subjective: Seen and examined.  He now has left middle and lower abdominal pain again.  Although no nausea or vomiting tolerating diet well.  No other complaint.  Objective: Vitals:   09/09/19 0426 09/09/19 0500 09/09/19 0551 09/09/19 1353  BP: (!) 159/99   (!) 145/87 (!) 144/96  Pulse: (!) 105  100 91  Resp: 18  18 18   Temp: 99.4 F (37.4 C)  98.5 F (36.9 C) 97.7 F (36.5 C)  TempSrc: Oral  Oral Oral  SpO2: 95%  97% 100%  Weight:  78 kg    Height:        Intake/Output Summary (Last 24 hours) at 09/09/2019 1406 Last data filed at 09/09/2019 1352 Gross per 24 hour  Intake 3299.22 ml  Output 3810 ml  Net -510.78 ml   Filed Weights   09/07/19 0537 09/08/19 0553 09/09/19 0500  Weight: 75.5 kg 76.8 kg 78 kg   General exam: Appears calm and comfortable  Respiratory system: Clear to auscultation. Respiratory effort normal. Cardiovascular system: S1 & S2 heard, RRR. No JVD, murmurs, rubs, gallops or clicks. No pedal edema. Gastrointestinal system: Abdomen is nondistended, soft and slightly tender at left middle and lower abdomen. No organomegaly or masses felt. Normal bowel sounds heard. Central nervous system: Alert and oriented. No focal neurological deficits. Extremities: Symmetric 5 x 5 power. Skin: No rashes, lesions or ulcers.  Psychiatry: Judgement and insight appear normal. Mood & affect appropriate.    Data Reviewed: I have personally reviewed following labs and imaging studies  CBC: Recent Labs  Lab 09/04/19 0327 09/06/19 0325 09/08/19 0415  WBC 5.4 5.3 6.7  NEUTROABS 3.6 3.6  --   HGB 7.5* 7.3* 7.7*  HCT 23.7* 23.1* 23.3*  MCV 91.9 93.1 91.0  PLT 250 230 281   Basic Metabolic Panel: Recent Labs  Lab 09/04/19 0327 09/05/19 0403 09/06/19 0325 09/07/19 0330 09/08/19 0415 09/08/19 0816  NA 133* 135 134* 136  --  132*  K 4.2 4.1 4.1 3.9  --  4.1  CL 102 104 103 103  --  100  CO2 27 25 25 26   --  26  GLUCOSE 129* 132* 117* 141*  --  129*  BUN 10 9 9 10   --  13  CREATININE 0.36* 0.33* 0.30* 0.37*  --  0.40*  CALCIUM 7.9* 7.9* 7.8* 8.0*  --  7.9*  MG 1.9 1.9 1.8 2.0 1.8  --   PHOS 3.6 3.8 3.9 3.8 4.1  --    GFR: Estimated Creatinine Clearance: 116.3 mL/min (A) (by C-G formula based on SCr of 0.4 mg/dL  (L)). Liver Function Tests: Recent Labs  Lab 09/03/19 0345 09/04/19 0327 09/07/19 0330 09/08/19 0816  AST 130* 109* 52* 36  ALT 94* 104* 80* 63*  ALKPHOS 105 101 92 85  BILITOT 0.4 0.2* 0.3 0.3  PROT 6.2* 6.2* 6.0* 6.1*  ALBUMIN 1.6* 1.6* 1.7* 1.6*   No results for input(s): LIPASE, AMYLASE in the last 168 hours. No results for input(s): AMMONIA in the last 168 hours. Coagulation Profile: No results for input(s): INR, PROTIME in the last 168 hours. Cardiac Enzymes: No results for input(s): CKTOTAL, CKMB, CKMBINDEX, TROPONINI in the last 168 hours. BNP (last 3 results) No results for input(s): PROBNP in the last 8760 hours. HbA1C: No results for input(s): HGBA1C in the last 72 hours. CBG: Recent Labs  Lab 09/08/19 0000 09/08/19 0746 09/08/19 2307 09/09/19 0552 09/09/19 1149  GLUCAP 111* 124* 97 122*  110*   Lipid Profile: Recent Labs    09/08/19 0415  TRIG 107   Thyroid Function Tests: No results for input(s): TSH, T4TOTAL, FREET4, T3FREE, THYROIDAB in the last 72 hours. Anemia Panel: No results for input(s): VITAMINB12, FOLATE, FERRITIN, TIBC, IRON, RETICCTPCT in the last 72 hours. Urine analysis:    Component Value Date/Time   COLORURINE YELLOW 08/28/2019 0417   APPEARANCEUR CLEAR 08/28/2019 0417   LABSPEC 1.005 08/28/2019 0417   PHURINE 5.0 08/28/2019 0417   GLUCOSEU NEGATIVE 08/28/2019 0417   HGBUR NEGATIVE 08/28/2019 0417   BILIRUBINUR NEGATIVE 08/28/2019 0417   KETONESUR 5 (A) 08/28/2019 0417   PROTEINUR NEGATIVE 08/28/2019 0417   NITRITE NEGATIVE 08/28/2019 0417   LEUKOCYTESUR NEGATIVE 08/28/2019 0417   No results found for this or any previous visit (from the past 240 hour(s)).    Radiology Studies: No results found.  Scheduled Meds: . busPIRone  30 mg Oral QHS  . Chlorhexidine Gluconate Cloth  6 each Topical Daily  . dexlansoprazole  60 mg Oral Daily  . lipase/protease/amylase  24,000 Units Oral TID AC  . protein supplement  1 Scoop Oral  TID WC  . sodium chloride flush  10-40 mL Intracatheter Q12H  . sucralfate  1 g Oral Q6H   Continuous Infusions: . sodium chloride Stopped (09/09/19 0845)  . meropenem (MERREM) IV 1 g (09/09/19 0548)  . TPN ADULT (ION) 95 mL/hr at 09/08/19 1704     LOS: 13 days   Time spent: 26 minutes.  Hughie Closs, MD [facility] [email or web address] 09/09/2019, 2:06 PM

## 2019-09-10 ENCOUNTER — Ambulatory Visit: Payer: Self-pay | Admitting: Gastroenterology

## 2019-09-10 DIAGNOSIS — I82890 Acute embolism and thrombosis of other specified veins: Secondary | ICD-10-CM

## 2019-09-10 LAB — CBC WITH DIFFERENTIAL/PLATELET
Abs Immature Granulocytes: 0.04 10*3/uL (ref 0.00–0.07)
Basophils Absolute: 0 10*3/uL (ref 0.0–0.1)
Basophils Relative: 0 %
Eosinophils Absolute: 0.2 10*3/uL (ref 0.0–0.5)
Eosinophils Relative: 3 %
HCT: 23.8 % — ABNORMAL LOW (ref 39.0–52.0)
Hemoglobin: 7.7 g/dL — ABNORMAL LOW (ref 13.0–17.0)
Immature Granulocytes: 1 %
Lymphocytes Relative: 11 %
Lymphs Abs: 0.8 10*3/uL (ref 0.7–4.0)
MCH: 28.8 pg (ref 26.0–34.0)
MCHC: 32.4 g/dL (ref 30.0–36.0)
MCV: 89.1 fL (ref 80.0–100.0)
Monocytes Absolute: 0.4 10*3/uL (ref 0.1–1.0)
Monocytes Relative: 6 %
Neutro Abs: 5.8 10*3/uL (ref 1.7–7.7)
Neutrophils Relative %: 79 %
Platelets: 252 10*3/uL (ref 150–400)
RBC: 2.67 MIL/uL — ABNORMAL LOW (ref 4.22–5.81)
RDW: 14.7 % (ref 11.5–15.5)
WBC: 7.3 10*3/uL (ref 4.0–10.5)
nRBC: 0 % (ref 0.0–0.2)

## 2019-09-10 LAB — COMPREHENSIVE METABOLIC PANEL
ALT: 49 U/L — ABNORMAL HIGH (ref 0–44)
AST: 28 U/L (ref 15–41)
Albumin: 1.8 g/dL — ABNORMAL LOW (ref 3.5–5.0)
Alkaline Phosphatase: 86 U/L (ref 38–126)
Anion gap: 7 (ref 5–15)
BUN: 11 mg/dL (ref 6–20)
CO2: 25 mmol/L (ref 22–32)
Calcium: 8.2 mg/dL — ABNORMAL LOW (ref 8.9–10.3)
Chloride: 100 mmol/L (ref 98–111)
Creatinine, Ser: 0.53 mg/dL — ABNORMAL LOW (ref 0.61–1.24)
GFR calc Af Amer: >60 mL/min (ref >60)
GFR calc non Af Amer: >60 mL/min (ref >60)
Glucose, Bld: 129 mg/dL — ABNORMAL HIGH (ref 70–99)
Potassium: 4 mmol/L (ref 3.5–5.1)
Sodium: 132 mmol/L — ABNORMAL LOW (ref 135–145)
Total Bilirubin: 0.2 mg/dL — ABNORMAL LOW (ref 0.3–1.2)
Total Protein: 6.4 g/dL — ABNORMAL LOW (ref 6.5–8.1)

## 2019-09-10 MED ORDER — RESOURCE INSTANT PROTEIN PO PWD PACKET
1.0000 | Freq: Three times a day (TID) | ORAL | Status: DC
  Filled 2019-09-10 (×3): qty 6

## 2019-09-10 MED ORDER — AMOXICILLIN-POT CLAVULANATE 875-125 MG PO TABS
1.0000 | ORAL_TABLET | Freq: Two times a day (BID) | ORAL | Status: DC
  Administered 2019-09-11: 1 via ORAL
  Filled 2019-09-10: qty 1

## 2019-09-10 MED ORDER — PANCRELIPASE (LIP-PROT-AMYL) 12000-38000 UNITS PO CPEP
48000.0000 [IU] | ORAL_CAPSULE | Freq: Three times a day (TID) | ORAL | Status: DC
  Administered 2019-09-10 – 2019-09-11 (×3): 48000 [IU] via ORAL
  Filled 2019-09-10 (×3): qty 4

## 2019-09-10 NOTE — Progress Notes (Addendum)
[facility] Progress Note  CC:  Necrotizing pancreatitis  Subjective:  CT scan shows improvement in fluid collections and necrosis.  He is feeling much better.  Looks much better clinically.  Eating well.  Objective:  Vital signs in last 24 hours: Temp:  [97.7 F (36.5 C)-98.9 F (37.2 C)] 98.5 F (36.9 C) (09/08 0509) Pulse Rate:  [91-95] 95 (09/08 0509) Resp:  [18] 18 (09/08 0509) BP: (144-148)/(94-96) 145/94 (09/08 0509) SpO2:  [95 %-100 %] 95 % (09/08 0509) Weight:  [74.7 kg] 74.7 kg (09/08 0513) Last BM Date: 09/08/19 General:  Alert, Well-developed, in NAD Heart:  Regular rate and rhythm; no murmurs Pulm:  CTAB.  No increased WOB. Abdomen:  Soft, non-distended.  BS present.  Non-tender. Extremities:  Without edema. Neurologic:  Alert and oriented x 4;  grossly normal neurologically. Psych:  Alert and cooperative. Normal mood and affect.  Intake/Output from previous day: 09/07 0701 - 09/08 0700 In: 2544.8 [P.O.:960; I.V.:1384.8; IV Piggyback:200] Out: 1500 [Urine:1500]  Lab Results: Recent Labs    09/08/19 0415  WBC 6.7  HGB 7.7*  HCT 23.3*  PLT 281   BMET Recent Labs    09/08/19 0816  NA 132*  K 4.1  CL 100  CO2 26  GLUCOSE 129*  BUN 13  CREATININE 0.40*  CALCIUM 7.9*   LFT Recent Labs    09/08/19 0816  PROT 6.1*  ALBUMIN 1.6*  AST 36  ALT 63*  ALKPHOS 85  BILITOT 0.3   CT ABDOMEN PELVIS W CONTRAST  Result Date: 09/09/2019 CLINICAL DATA:  Necrotizing pancreatitis EXAM: CT ABDOMEN AND PELVIS WITH CONTRAST TECHNIQUE: Multidetector CT imaging of the abdomen and pelvis was performed using the standard protocol following bolus administration of intravenous contrast. CONTRAST:  OMNIPAQUE IOHEXOL 300 MG/ML  SOLN COMPARISON:  08/27/2019 FINDINGS: Lower chest: Small bilateral pleural effusions noted with left base atelectasis. Hepatobiliary: No suspicious focal abnormality within the liver parenchyma. Mild pericholecystic edema  evident. No intrahepatic or extrahepatic biliary dilation. Pancreas: Pancreatic head fluid collection measured previously at 3.2 x 1.9 cm has decreased in the interval, measuring approximately 1.9 x 1.0 cm today and containing more gas than fluid. This collection tract anterior to the body of pancreas previously which also appears decreased but again contains gas. Rim enhancing fluid collection in the tail of pancreas measured previously at 5.7 x 5.0 cm now measures 4.8 x 4.0 cm. This collection tracks anteriorly to the posterior wall the stomach which appears thickened. And also tracks into the splenic hilum as before. Spleen: Probable splenomegaly.  No focal mass lesion. Adrenals/Urinary Tract: No adrenal nodule or mass. Kidneys unremarkable. No evidence for hydroureter. The urinary bladder appears normal for the degree of distention. Stomach/Bowel: Wall thickening noted in the posterior wall of the greater curvature or is contiguous with the pancreatic tail collection. Substantial edema/inflammation noted around the duodenum. No small bowel wall thickening. No small bowel dilatation. The terminal ileum is normal. The appendix is not visualized, but there is no edema or inflammation in the region of the cecum. No gross colonic mass. No colonic wall thickening. Vascular/Lymphatic: There is abdominal aortic atherosclerosis without aneurysm. IVC is patent. Main portal vein is patent although there is marked attenuation of the portal splenic confluence. SMV is patent. 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 There is no gastrohepatic or hepatoduodenal ligament lymphadenopathy. No retroperitoneal or mesenteric lymphadenopathy. No pelvic sidewall lymphadenopathy. Reproductive: The prostate gland and seminal vesicles are unremarkable. Other: Extensive retroperitoneal edema noted in  the abdomen, as before. Small volume free fluid in the pelvis is minimally increased compared to prior. Musculoskeletal: No worrisome lytic or sclerotic  osseous abnormality. IMPRESSION: 1. Interval decrease in size of the pancreatic head fluid collection. No appreciable fluid in this collection today although there is residual gas. This collection also tracks anterior to the body of pancreas and also appears decreased with some residual gas. 2. Interval decrease in size of the rim enhancing fluid collection in the tail of pancreas, now measuring 4.8 x 4.0 cm. This collection tracks anteriorly to the posterior wall of the stomach which appears thickened and into the splenic hilum. 3. 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 There is marked attenuation on the portal splenic confluence although portal vein and superior mesenteric vein are patent. 4. Marked edema/inflammation around the duodenum and pancreas. 5. Small volume free fluid in the pelvis is minimally increased compared to prior. 6. Small bilateral pleural effusions with left base atelectasis. 7. Probable splenomegaly. 8. Aortic Atherosclerosis (ICD10-I70.0). Electronically Signed   By: Kennith Center M.D.   On: 09/09/2019 16:13   Assessment / Plan: #  Severe necrotizing pancreatitis with walled off necrosis.  --Overall his pain is controlled. Only got Morphine once yesterday and none so far today. --On day #15 of Meropenem.  --TNA discontinued 9/7. Calorie count in progress. --At present, no plans for drainage / debridement of WOPN especially with improvement on CT scan yesterday. --Will complete meropenem today and transition to Augmentin BID tomorrow, 9/9.  Will need to complete a month of antibiotics so should receive a 2 week course of Augmentin.  #  Splenic vein occlusion/thrombosis:CT also showed a splenic vein occlusion with narrowing of the main portal vein with upper abdominal vein collaterals. --Not on anticoagulation with increased risk of GI bleed.  #  CAD: s/p PCI:  Was on Plavix at home.  #  Iron deficiency/normocytic anemia:Hemoglobin dropped in therange of 8 grams. He was transfused  with a unit of PRBC on 08/28/2019.  FOBT was negative although he did report black stools at home PTA.  Hemoglobin stable at 7.7 grams today.   --s/p IV iron 8/27 --Continue PPI BID.   Diarrhea:  Likely due to EPI as pancreatic elastase fecal study low. --Is on Creon 24000 units with meals.  Will increase to 48000 units with meals as he has not noticed a difference with current dose and seems that it is being underdosed.  Upon discharge should be given prescription enough for 18299 units with three meals and 24000 units for 2 snacks per day.    LOS: 14 days   Princella Pellegrini. Doni Widmer  09/10/2019, 9:15 AM

## 2019-09-10 NOTE — Progress Notes (Signed)
PROGRESS NOTE    Gary Atkinson  TDV:[id number] DOB: 1970-12-24 DOA: 08/27/2019 PCP: [facility]  Brief Narrative:  49 year old white male CAD status post stent 2011?  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 epigastric pain and tenderness  T-max 102 CT abdomen pelvis = acute superimposed on chronic pancreatitis with possible infection?  Obstruction splenic vein   Assessment & Plan:   Principal Problem:   Pancreatitis, necrotizing Active Problems:   CAD (coronary artery disease), native coronary artery   Benign essential HTN   Pancreatic necrosis   Acute pancreatitis   Malnutrition of moderate degree \  1. Acute superimposed on necrotizing pancreatitis from 08/2019 a. General surgery/GI consulted-finishing meropenem 9/9 and then 2 more weeks of Augmentin b. Repeat CT 9/7 shows decrease in pancreatic head fluid collection, interval decrease rim-enhancing collection tail of pancreas 4.8 X4.0 currently c. Possible?  Endoscopic necrosectomy in the outpatient setting by GI-General surgery did not feel any need for open debridement d. TPN discontinued-tolerating soft diet currently e. Cut back saline from 75 to 50 cc/H 2. Splenic vein thrombosis and occlusion a. Defer to GI for further planning-may benefit once all procedures are performed from 3 to 6 months course of NOAC depending on their preferences b. Note patient is also on aspirin 3. Esophagitis? a. Continue Dexilant 60 daily, Carafate 1 g every 6 hourly b. Tolerating well 4. CAD status 2 stents 2010 a. Continue aspirin 81  mg Plavix from home being held 5. HTN a. Uncontrolled at this time-prior to admission on Inderal 20 daily, lisinopril 10-we will resume Inderal.today b.  6. History of occasional binge drinking (admitted this to prior GI physician 7/22 a. Will need outpatient counseling regarding the same 7. Iron deficiency anemia a. Hemoglobin persistently low at 7 b. Saturation ratios performed 826 showed iron 21 saturation of 21 in addition c. Repeat in a.m. 8. Depression 9. Continue BuSpar 30 at bedtime, Klonopin 1 mg at bedtime, Seroquel 50 at bedtime 10. prior protein energy malnutrition BMI 22 11. Exocrine diarrhea a. Defer to GI-increased to 48,000 3 times daily, 24,000 for snacks  DVT prophylaxis: SCD Code Status: Presumed full none Family Communication: None Disposition: Inpatient  Status is: Inpatient  Remains inpatient appropriate because:Persistent severe electrolyte disturbances, Altered mental status and IV treatments appropriate due to intensity of illness or inability to take PO   Dispo: The patient is from: Home              Anticipated d/c is to: Home              Anticipated d/c date is: 1 day              Patient currently is medically stable to d/c.       Consultants:   GI  Procedures: Multiple including CT as above  Antimicrobials: Currently meropenem transitioned to Augmentin   Subjective: Doing well feels a little swollen in his feet no chest pain no fever no chills no nausea no vomiting Tolerating soft diet fairly well Previously had some itching in his lower extremities Tells me he does not drink heavily although it is noted in chart previously he has  drank heavily at times No fever no chills   Objective: Vitals:   09/09/19 1353 09/09/19 2048 09/10/19 0509 09/10/19 0513  BP: (!) 144/96 (!) 148/96 (!) 145/94   Pulse: 91 95 95   Resp: 18 18 18    Temp: 97.7 F (36.5 C) 98.9 F (37.2 C) 98.5 F (36.9 C)   TempSrc: Oral Oral Oral   SpO2: 100% 99%  95%   Weight:    74.7 kg  Height:        Intake/Output Summary (Last 24 hours) at 09/10/2019 1052 Last data filed at 09/10/2019 0954 Gross per 24 hour  Intake 1672.27 ml  Output 1500 ml  Net 172.27 ml   Filed Weights   09/08/19 0553 09/09/19 0500 09/10/19 0513  Weight: 76.8 kg 78 kg 74.7 kg    Examination:  General exam: Flat affect pleasant EOMI NCAT. Respiratory system: Clinically clear no added good sound no rales no rhonchi Cardiovascular system: S1-S2 no murmur rub or gallop Gastrointestinal system: Soft nontender no rebound no guarding no tenderness in epigastrium. Central nervous system: Intact no focal deficit Extremities: Mild edema in feet Skin: No rash no swelling Psychiatry: Flat  Data Reviewed: I have personally reviewed following labs and imaging studies  Sodium 132 BUN/creatinine 13/0.4-11/0.5 Hemoglobin 7.7, WBC  Radiology Studies: CT ABDOMEN PELVIS W CONTRAST  Result Date: 09/09/2019 CLINICAL DATA:  Necrotizing pancreatitis EXAM: CT ABDOMEN AND PELVIS WITH CONTRAST TECHNIQUE: Multidetector CT imaging of the abdomen and pelvis was performed using the standard protocol following bolus administration of intravenous contrast. CONTRAST:  11/09/2019 OMNIPAQUE IOHEXOL 300 MG/ML  SOLN COMPARISON:  08/27/2019 FINDINGS: Lower chest: Small bilateral pleural effusions noted with left base atelectasis. Hepatobiliary: No suspicious focal abnormality within the liver parenchyma. Mild pericholecystic edema evident. No intrahepatic or extrahepatic biliary dilation. Pancreas: Pancreatic head fluid collection measured previously at 3.2 x 1.9 cm has decreased in the interval, measuring approximately 1.9 x 1.0 cm today and containing more gas than fluid. This collection tract anterior to the body of pancreas previously which also appears decreased but again contains gas. Rim enhancing fluid collection in the tail of pancreas measured previously at 5.7 x 5.0 cm now measures 4.8 x 4.0 cm. This  collection tracks anteriorly to the posterior wall the stomach which appears thickened. And also tracks into the splenic hilum as before. Spleen: Probable splenomegaly.  No focal mass lesion. Adrenals/Urinary Tract: No adrenal nodule or mass. Kidneys unremarkable. No evidence for hydroureter. The urinary bladder appears normal for the degree of distention. Stomach/Bowel: Wall thickening noted in the posterior wall of the greater curvature or is contiguous with the pancreatic tail collection. Substantial edema/inflammation noted around the duodenum. No small bowel wall thickening. No small bowel dilatation. The terminal ileum is normal. The appendix is not visualized, but there is no edema or inflammation in the region of the cecum. No gross colonic mass. No colonic wall thickening. Vascular/Lymphatic: There is abdominal aortic atherosclerosis without aneurysm. IVC is patent. Main portal vein is patent although there is marked attenuation of the portal splenic confluence. SMV is patent. 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 There is no gastrohepatic or hepatoduodenal ligament lymphadenopathy. No retroperitoneal or mesenteric lymphadenopathy. No pelvic sidewall lymphadenopathy. Reproductive: The prostate gland and seminal vesicles are unremarkable. Other: Extensive retroperitoneal edema noted in the abdomen, as before. Small volume free fluid in the pelvis is minimally increased compared to prior. Musculoskeletal: No worrisome lytic or sclerotic osseous abnormality. IMPRESSION: 1. Interval decrease in size of the pancreatic head fluid collection.  No appreciable fluid in this collection today although there is residual gas. This collection also tracks anterior to the body of pancreas and also appears decreased with some residual gas. 2. Interval decrease in size of the rim enhancing fluid collection in the tail of pancreas, now measuring 4.8 x 4.0 cm. This collection tracks anteriorly to the posterior wall of the stomach which  appears thickened and into the splenic hilum. 3. 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 There is marked attenuation on the portal splenic confluence although portal vein and superior mesenteric vein are patent. 4. Marked edema/inflammation around the duodenum and pancreas. 5. Small volume free fluid in the pelvis is minimally increased compared to prior. 6. Small bilateral pleural effusions with left base atelectasis. 7. Probable splenomegaly. 8. Aortic Atherosclerosis (ICD10-I70.0). Electronically Signed   By: Kennith Center M.D.   On: 09/09/2019 16:13     Scheduled Meds: . [START ON 09/11/2019] amoxicillin-clavulanate  1 tablet Oral Q12H  . busPIRone  30 mg Oral QHS  . Chlorhexidine Gluconate Cloth  6 each Topical Daily  . dexlansoprazole  60 mg Oral Daily  . lipase/protease/amylase  48,000 Units Oral TID AC  . protein supplement  1 Scoop Oral TID WC  . sodium chloride flush  10-40 mL Intracatheter Q12H  . sucralfate  1 g Oral Q6H   Continuous Infusions: . sodium chloride Stopped (09/09/19 0845)  . meropenem (MERREM) IV 1 g (09/10/19 0626)     LOS: 14 days    Time spent: 45  Rhetta Mura, MD [facility] To contact the attending provider between 7A-7P or the covering provider during after hours 7P-7A, please log into the web site [email or web address] and access using universal [place] password for that web site. If you do not have the password, please call the hospital operator.  09/10/2019, 10:52 AM

## 2019-09-10 NOTE — Hospital Course (Signed)
IMPRESSION: 1. Interval decrease in size of the pancreatic head fluid collection. No appreciable fluid in this collection today although there is residual gas. This collection also tracks anterior to the body of pancreas and also appears decreased with some residual gas. 2. Interval decrease in size of the rim enhancing fluid collection in the tail of pancreas, now measuring 4.8 x 4.0 cm. This collection tracks anteriorly to the posterior wall of the stomach which appears thickened and into the splenic hilum. 3. 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 There is marked attenuation on the portal splenic confluence although portal vein and superior mesenteric vein are patent. 4. Marked edema/inflammation around the duodenum and pancreas. 5. Small volume free fluid in the pelvis is minimally increased compared to prior. 6. Small bilateral pleural effusions with left base atelectasis. 7. Probable splenomegaly. 8. Aortic Atherosclerosis (ICD10-I70.0).

## 2019-09-11 ENCOUNTER — Telehealth: Payer: Self-pay

## 2019-09-11 LAB — CBC WITH DIFFERENTIAL/PLATELET
Abs Immature Granulocytes: 0.04 10*3/uL (ref 0.00–0.07)
Basophils Absolute: 0 10*3/uL (ref 0.0–0.1)
Basophils Relative: 1 %
Eosinophils Absolute: 0.2 10*3/uL (ref 0.0–0.5)
Eosinophils Relative: 4 %
HCT: 23.2 % — ABNORMAL LOW (ref 39.0–52.0)
Hemoglobin: 7.6 g/dL — ABNORMAL LOW (ref 13.0–17.0)
Immature Granulocytes: 1 %
Lymphocytes Relative: 17 %
Lymphs Abs: 1.1 10*3/uL (ref 0.7–4.0)
MCH: 29.3 pg (ref 26.0–34.0)
MCHC: 32.8 g/dL (ref 30.0–36.0)
MCV: 89.6 fL (ref 80.0–100.0)
Monocytes Absolute: 0.5 10*3/uL (ref 0.1–1.0)
Monocytes Relative: 8 %
Neutro Abs: 4.6 10*3/uL (ref 1.7–7.7)
Neutrophils Relative %: 69 %
Platelets: 245 10*3/uL (ref 150–400)
RBC: 2.59 MIL/uL — ABNORMAL LOW (ref 4.22–5.81)
RDW: 14.6 % (ref 11.5–15.5)
WBC: 6.6 10*3/uL (ref 4.0–10.5)
nRBC: 0 % (ref 0.0–0.2)

## 2019-09-11 LAB — IRON AND TIBC
Iron: 25 ug/dL — ABNORMAL LOW (ref 45–182)
Saturation Ratios: 15 % — ABNORMAL LOW (ref 17.9–39.5)
TIBC: 165 ug/dL — ABNORMAL LOW (ref 250–450)
UIBC: 140 ug/dL

## 2019-09-11 LAB — COMPREHENSIVE METABOLIC PANEL
ALT: 42 U/L (ref 0–44)
AST: 22 U/L (ref 15–41)
Albumin: 1.7 g/dL — ABNORMAL LOW (ref 3.5–5.0)
Alkaline Phosphatase: 79 U/L (ref 38–126)
Anion gap: 8 (ref 5–15)
BUN: 9 mg/dL (ref 6–20)
CO2: 24 mmol/L (ref 22–32)
Calcium: 7.8 mg/dL — ABNORMAL LOW (ref 8.9–10.3)
Chloride: 102 mmol/L (ref 98–111)
Creatinine, Ser: 0.46 mg/dL — ABNORMAL LOW (ref 0.61–1.24)
GFR calc Af Amer: >60 mL/min (ref >60)
GFR calc non Af Amer: >60 mL/min (ref >60)
Glucose, Bld: 92 mg/dL (ref 70–99)
Potassium: 3.9 mmol/L (ref 3.5–5.1)
Sodium: 134 mmol/L — ABNORMAL LOW (ref 135–145)
Total Bilirubin: 0.5 mg/dL (ref 0.3–1.2)
Total Protein: 5.8 g/dL — ABNORMAL LOW (ref 6.5–8.1)

## 2019-09-11 LAB — RETICULOCYTES
Immature Retic Fract: 24 % — ABNORMAL HIGH (ref 2.3–15.9)
RBC.: 2.59 MIL/uL — ABNORMAL LOW (ref 4.22–5.81)
Retic Count, Absolute: 60.1 10*3/uL (ref 19.0–186.0)
Retic Ct Pct: 2.3 % (ref 0.4–3.1)

## 2019-09-11 LAB — VITAMIN B12: Vitamin B-12: 286 pg/mL (ref 180–914)

## 2019-09-11 LAB — FERRITIN: Ferritin: 606 ng/mL — ABNORMAL HIGH (ref 24–336)

## 2019-09-11 LAB — FOLATE: Folate: 14.5 ng/mL (ref >5.9)

## 2019-09-11 MED ORDER — LOPERAMIDE HCL 2 MG PO CAPS: 2.0000 mg | ORAL_CAPSULE | ORAL | 0 refills | Status: AC | PRN

## 2019-09-11 MED ORDER — SODIUM CHLORIDE 0.9 % IV SOLN
510.0000 mg | Freq: Once | INTRAVENOUS | Status: AC
  Administered 2019-09-11: 510 mg via INTRAVENOUS
  Filled 2019-09-11: qty 510

## 2019-09-11 MED ORDER — SUCRALFATE 1 G PO TABS: 1.0000 g | ORAL_TABLET | Freq: Four times a day (QID) | ORAL | 0 refills | Status: DC

## 2019-09-11 MED ORDER — PANCRELIPASE (LIP-PROT-AMYL) 24000-76000 UNITS PO CPEP: 48000.0000 [IU] | ORAL_CAPSULE | Freq: Three times a day (TID) | ORAL | 6 refills | Status: DC

## 2019-09-11 MED ORDER — OXYCODONE HCL 5 MG PO TABS: 5.0000 mg | ORAL_TABLET | Freq: Three times a day (TID) | ORAL | 0 refills | Status: AC | PRN

## 2019-09-11 MED ORDER — AMOXICILLIN-POT CLAVULANATE 875-125 MG PO TABS: 1.0000 | ORAL_TABLET | Freq: Two times a day (BID) | ORAL | 0 refills | Status: DC

## 2019-09-11 NOTE — Progress Notes (Signed)
PICC line removal: Pt instructed to remain flat in bed for 30 minutes. Site unremarkable. Discussed risk for bleeding and infection at site. Keep dressing clean dry and intact for 24 hours. Pt voiced understanding.

## 2019-09-11 NOTE — Progress Notes (Signed)
Pt discharged to home in stable condition. PICC line removal site intact. AVS teaching completed and understanding verbalized by patient. Signed oxycodone prescription provided to patient. Denies further questions or concerns at this time.  Ardyth Gal, RN 09/11/2019

## 2019-09-11 NOTE — Telephone Encounter (Signed)
The pt had a CT scan on 9/7.  Does he need a repeat in 3-4 weeks?

## 2019-09-11 NOTE — Telephone Encounter (Signed)
Yes repeat scan at 3-4 weeks (before clinic visit). Thanks. GM

## 2019-09-11 NOTE — Discharge Summary (Signed)
Physician Discharge Summary  Gary Atkinson ZOX:[id number] DOB: 10/29/1970 DOA: 08/27/2019  PCP: [name]  Admit date: 08/27/2019 Discharge date: 09/11/2019  Time spent: 35 minutes  Recommendations for Outpatient Follow-up:  1. Requires repeat CT scan in about a month-have CCed gastroenterology to ensure they are aware--- he will need consideration after the CT to determine if he is a candidate for systemic anticoagulation given splenic vein thrombosis and has been held off of aspirin at this time 2. New medications Carafate, Imodium, low-dose oxycodone as well as higher dose of Creon on discharge 3. Complete another 14 days of Augmentin-patient cautioned to use probiotics and yogurt to help replace with flora 4. Consider resuming lisinopril as an outpatient I have held upon discharge  Discharge Diagnoses:  Principal Problem:   Pancreatitis, necrotizing Active Problems:   CAD (coronary artery disease), native coronary artery   Benign essential HTN   Pancreatic necrosis   Acute pancreatitis   Malnutrition of moderate degree   Splenic vein thrombosis   Discharge Condition: Improved  Diet recommendation: Soft  Filed Weights   09/09/19 0500 09/10/19 0513 09/11/19 0500  Weight: 78 kg 74.7 kg 73 kg    History of present illness:  49 year old white male CAD status post stent 2011?  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 epigastric pain and tenderness  T-max 102 CT abdomen pelvis = acute superimposed on chronic pancreatitis with possible infection?  Obstruction splenic  vein  Hospital Course:   1. Acute superimposed on necrotizing pancreatitis from 08/2019 a. General surgery/GI consulted-finishing meropenem 9/9 and then 2 more weeks of Augmentin b. Repeat CT 9/7 shows decrease in pancreatic head fluid collection, interval decrease rim-enhancing collection tail of pancreas 4.8 X4.0 currently c. Possible?  Endoscopic necrosectomy in the outpatient setting by GI-General surgery did not feel any need for open debridement on 9/8 d. TPN discontinued on 9/8 and currently the patient-tolerating soft diet currently 2. Splenic vein thrombosis and occlusion a. Defer to GI for further planning-may benefit once all procedures are performed from 3 to 6 months course of NOAC depending on their preferences or if there is extension into SMV b. Note patient is also on aspirin which has been held 3. Esophagitis? a. Continue Dexilant 60 daily, Carafate 1 g every 6 hourly b. Tolerating well 4. CAD status 2 stents 2010 a. Continue aspirin 81 mg--- he no longer takes Plavix  5. HTN a. Uncontrolled at this time-prior to admission on Inderal 20 daily, lisinopril 10-we will resume Inderal on discharge and may resume as an outpatient and his lisinopril 6. History of occasional binge drinking (admitted this to prior GI physician 7/22 a. Will need outpatient counseling regarding the same 7. Iron deficiency anemia a. Hemoglobin persistently low at 7 b. Saturation ratios performed 826 showed iron 21 saturation of 21 in addition c. He received Feraheme 1 dose on 9/9 prior to discharge 8. Depression 1. Continue BuSpar 30 at bedtime, Klonopin 1 mg at bedtime, Seroquel 50 at bedtime 9. prior protein energy malnutrition BMI 22 10. Exocrine diarrhea a. Defer to GI-increased to 48,000 3 times daily, 24,000 for snacks  Procedures:  Multiple  Consultations:  GI  Discharge  Exam: Vitals:   09/10/19 2100 09/11/19 0544  BP: (!) 155/104 (!) 140/95  Pulse: 100 93  Resp: 18 20  Temp:  98.9 F (37.2 C) 98.7 F (37.1 C)  SpO2: 98% 94%    General: Awake coherent alert pleasant no distress eating drinking without issue no chest pain no fever no chills no nausea no vomiting Cardiovascular: S1-S2 no murmur rub or gallop although slight tachycardia Respiratory: Clinically clear no added sound no rales no rhonchi Abdomen soft nontender no rebound no guarding not distended Neurologically intact however flat affect  Discharge Instructions   Discharge Instructions    Diet - low sodium heart healthy   Complete by: As directed    Discharge instructions   Complete by: As directed    Please take your medications as indicated I would hold off on your lisinopril and aspirin for now and ask your doctor about them We will refill certain meds including Imodium You will need to complete Augmentin for another 14 days as discussed In addition I would recommend continuing Carafate to help coat your esophagus and allow you to eat and have called in tablets for you Please get labs in the outpatient setting with your regular physician in about 1 to 2 weeks Gastroenterology will follow you up in the outpatient setting as discussed with you   Increase activity slowly   Complete by: As directed      Allergies as of 09/11/2019   No Known Allergies     Medication List    STOP taking these medications   aspirin 81 MG tablet   lisinopril 10 MG tablet Commonly known as: ZESTRIL   topiramate 25 MG tablet Commonly known as: TOPAMAX     TAKE these medications   acetaminophen 325 MG tablet Commonly known as: TYLENOL Take 650 mg by mouth every 6 (six) hours as needed for mild pain or headache.   amoxicillin-clavulanate 875-125 MG tablet Commonly known as: AUGMENTIN Take 1 tablet by mouth every 12 (twelve) hours.   busPIRone 10 MG tablet Commonly known as: BUSPAR Take 30 mg by mouth at bedtime.   clonazePAM 1 MG disintegrating tablet Commonly known as: KLONOPIN Take 1 mg by  mouth at bedtime.   dexlansoprazole 60 MG capsule Commonly known as: DEXILANT Take 1 capsule (60 mg total) by mouth daily. What changed: when to take this   loperamide 2 MG capsule Commonly known as: IMODIUM Take 1 capsule (2 mg total) by mouth as needed for diarrhea or loose stools.   nitroGLYCERIN 0.4 MG SL tablet Commonly known as: NITROSTAT Place 0.4 mg under the tongue as needed for chest pain.   oxyCODONE 5 MG immediate release tablet Commonly known as: Roxicodone Take 1 tablet (5 mg total) by mouth every 8 (eight) hours as needed for up to 3 days.   Pancrelipase (Lip-Prot-Amyl) 24000-76000 units Cpep Take 2 capsules (48,000 Units total) by mouth 3 (three) times daily before meals.   propranolol 40 MG tablet Commonly known as: INDERAL Take 0.5 tablets (20 mg total) by mouth daily.   QUEtiapine 50 MG tablet Commonly known as: SEROQUEL Take 1 tablet (50 mg total) by mouth at bedtime.   sucralfate 1 g tablet Commonly known as: Carafate Take 1 tablet (1 g total) by mouth 4 (four) times daily.   thiamine 100 MG tablet Take 1 tablet (100 mg total) by mouth daily. What changed: when to take this   VITAMIN C PO Take 1 tablet by mouth every evening.  No Known Allergies    The results of significant diagnostics from this hospitalization (including imaging, microbiology, ancillary and laboratory) are listed below for reference.    Significant Diagnostic Studies: CT ABDOMEN PELVIS W CONTRAST  Result Date: 09/09/2019 CLINICAL DATA:  Necrotizing pancreatitis EXAM: CT ABDOMEN AND PELVIS WITH CONTRAST TECHNIQUE: Multidetector CT imaging of the abdomen and pelvis was performed using the standard protocol following bolus administration of intravenous contrast. CONTRAST:  OMNIPAQUE IOHEXOL 300 MG/ML  SOLN COMPARISON:  08/27/2019 FINDINGS: Lower chest: Small bilateral pleural effusions noted with left base atelectasis. Hepatobiliary: No suspicious focal abnormality  within the liver parenchyma. Mild pericholecystic edema evident. No intrahepatic or extrahepatic biliary dilation. Pancreas: Pancreatic head fluid collection measured previously at 3.2 x 1.9 cm has decreased in the interval, measuring approximately 1.9 x 1.0 cm today and containing more gas than fluid. This collection tract anterior to the body of pancreas previously which also appears decreased but again contains gas. Rim enhancing fluid collection in the tail of pancreas measured previously at 5.7 x 5.0 cm now measures 4.8 x 4.0 cm. This collection tracks anteriorly to the posterior wall the stomach which appears thickened. And also tracks into the splenic hilum as before. Spleen: Probable splenomegaly.  No focal mass lesion. Adrenals/Urinary Tract: No adrenal nodule or mass. Kidneys unremarkable. No evidence for hydroureter. The urinary bladder appears normal for the degree of distention. Stomach/Bowel: Wall thickening noted in the posterior wall of the greater curvature or is contiguous with the pancreatic tail collection. Substantial edema/inflammation noted around the duodenum. No small bowel wall thickening. No small bowel dilatation. The terminal ileum is normal. The appendix is not visualized, but there is no edema or inflammation in the region of the cecum. No gross colonic mass. No colonic wall thickening. Vascular/Lymphatic: There is abdominal aortic atherosclerosis without aneurysm. IVC is patent. Main portal vein is patent although there is marked attenuation of the portal splenic confluence. SMV is patent. 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 There is no gastrohepatic or hepatoduodenal ligament lymphadenopathy. No retroperitoneal or mesenteric lymphadenopathy. No pelvic sidewall lymphadenopathy. Reproductive: The prostate gland and seminal vesicles are unremarkable. Other: Extensive retroperitoneal edema noted in the abdomen, as before. Small volume free fluid in the pelvis is minimally increased compared to  prior. Musculoskeletal: No worrisome lytic or sclerotic osseous abnormality. IMPRESSION: 1. Interval decrease in size of the pancreatic head fluid collection. No appreciable fluid in this collection today although there is residual gas. This collection also tracks anterior to the body of pancreas and also appears decreased with some residual gas. 2. Interval decrease in size of the rim enhancing fluid collection in the tail of pancreas, now measuring 4.8 x 4.0 cm. This collection tracks anteriorly to the posterior wall of the stomach which appears thickened and into the splenic hilum. 3. 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 There is marked attenuation on the portal splenic confluence although portal vein and superior mesenteric vein are patent. 4. Marked edema/inflammation around the duodenum and pancreas. 5. Small volume free fluid in the pelvis is minimally increased compared to prior. 6. Small bilateral pleural effusions with left base atelectasis. 7. Probable splenomegaly. 8. Aortic Atherosclerosis (ICD10-I70.0). Electronically Signed   By: Kennith Center M.D.   On: 09/09/2019 16:13   CT ABDOMEN PELVIS W CONTRAST  Result Date: 08/27/2019 CLINICAL DATA:  History of necrotic pancreatitis diagnosed in July, presents with abdominal pain. EXAM: CT ABDOMEN AND PELVIS WITH CONTRAST TECHNIQUE: Multidetector CT imaging of the abdomen and pelvis was performed using the  standard protocol following bolus administration of intravenous contrast. CONTRAST:  OMNIPAQUE IOHEXOL 300 MG/ML  SOLN COMPARISON:  Multiple prior studies, most recent comparison from July 31, 2019 FINDINGS: Lower chest: Lung bases are clear. No consolidation. No pleural effusion. Hepatobiliary: Marked hepatic steatosis. Main portal vein remains patent, see below. Biliary duct distension with transition in the pancreatic head, this is developed since the previous exam. Stranding generalize in the upper abdomen some of which is adjacent to the gallbladder.  This is of uncertain significance Pancreas: Occlusion of splenic vein. Narrowing of the main portal vein, further narrowing of the peripheral main portal vein since the prior study. (Image 28, series 2) collateral formation with numerous gastroepiploic collaterals and gastric varices which have developed since the previous exam. 3.1 x 1.9 cm collection in the anterior pancreas does contain a small amount of gas compatible with walled-off necrosis involving the pancreatic head in the pancreatic-duodenal groove. Further necrosis of the gland. Walled off necrosis in the tail of the pancreas measuring 5.7 x 5.0 cm (image 27, series 2) previously 6.7 x 7.2 cm. This extends into the root of the mesentery and the anterior pararenal space in the LEFT hemiabdomen. Abundant stranding about the pancreas and duodenum. SMV is patent as described above. Spleen: Spleen is enlarged. Small amount of perisplenic fluid and fluid in the gastrohepatic recess. Wall up necrosis extends into the splenic hilum. Adrenals/Urinary Tract: Adrenal glands are normal. Symmetric renal enhancement. No hydronephrosis. Urinary bladder is normal. Stomach/Bowel: Stomach with gastric varices. Peri duodenal stranding and stranding throughout the root of the small bowel mesentery No sign of bowel obstruction. Colonic thickening at the hepatic flexure and splenic flexure similar at the a hepatic flexure but improved with respect to splenic flexure thickening when compared to the previous study. Vascular/Lymphatic: Calcified and noncalcified atheromatous plaque of the abdominal aorta. No adenopathy in the retroperitoneum. No adenopathy in the pelvis. Reproductive: Prostate unremarkable by CT. Other: Small volume ascites in the pelvis. Diminished when compared to the prior study. Musculoskeletal: No acute musculoskeletal process. No destructive bone finding. IMPRESSION: 1. Walled-off necrosis with further organization of necrotic pancreatic collections about  the pancreas tracking into splenic hilum and showing further necrosis of the gland at greater than 50% necrosis. Findings suspicious for acute pancreatitis superimposed on sequela of previous pancreatitis discussed above. 2. Small locules of gas in the pancreatic head within areas of walled-off necrosis suspicious for early infection. 3. Occlusion of the splenic vein. Narrowing of the main portal vein, further narrowing of the peripheral main portal vein since the prior study. This results in interval development of upper abdominal venous collaterals and gastric varices. 4. Biliary duct distension approximately 9 mm of the common bile duct has developed since the prior study. The gallbladder is less distended than on the prior exam. No overt signs of acute cholecystitis though there is generalized inflammation in the upper abdomen. Close attention on follow-up. 5. Colonic thickening at the hepatic flexure and splenic flexure similar at the a hepatic flexure but improved with respect to splenic flexure thickening when compared to the previous study. 6. Small volume ascites in the pelvis. Diminished when compared to the prior study. 7. Marked hepatic steatosis. 8. Aortic atherosclerosis. Aortic Atherosclerosis (ICD10-I70.0). Electronically Signed   By: Donzetta Kohut M.D.   On: 08/27/2019 21:24   DG Chest Port 1 View  Result Date: 08/27/2019 CLINICAL DATA:  Possible sepsis EXAM: PORTABLE CHEST 1 VIEW COMPARISON:  08/01/2019 FINDINGS: The heart size and mediastinal contours  are within normal limits. Both lungs are clear. The visualized skeletal structures are unremarkable. IMPRESSION: Negative. Electronically Signed   By: Charlett NoseKevin  Dover M.D.   On: 08/27/2019 19:35   US EKG SITE RITE  Result Date: 08/28/2019 If Site Rite image not attached, placement could not be confirmed due to current cardiac rhythm.   Microbiology: No results found for this or any previous visit (from the past 240 hour(s)).    Labs: Basic Metabolic Panel: Recent Labs  Lab 09/05/19 0403 09/05/19 0403 09/06/19 0325 09/07/19 0330 09/08/19 0415 09/08/19 0816 09/10/19 0954 09/11/19 0421  NA 135   < > 134* 136  --  132* 132* 134*  K 4.1   < > 4.1 3.9  --  4.1 4.0 3.9  CL 104   < > 103 103  --  100 100 102  CO2 25   < > 25 26  --  26 25 24   GLUCOSE 132*   < > 117* 141*  --  129* 129* 92  BUN 9   < > 9 10  --  13 11 9   CREATININE 0.33*   < > 0.30* 0.37*  --  0.40* 0.53* 0.46*  CALCIUM 7.9*   < > 7.8* 8.0*  --  7.9* 8.2* 7.8*  MG 1.9  --  1.8 2.0 1.8  --   --   --   PHOS 3.8  --  3.9 3.8 4.1  --   --   --    < > = values in this interval not displayed.   Liver Function Tests: Recent Labs  Lab 09/07/19 0330 09/08/19 0816 09/10/19 0954 09/11/19 0421  AST 52* 36 28 22  ALT 80* 63* 49* 42  ALKPHOS 92 85 86 79  BILITOT 0.3 0.3 0.2* 0.5  PROT 6.0* 6.1* 6.4* 5.8*  ALBUMIN 1.7* 1.6* 1.8* 1.7*   No results for input(s): LIPASE, AMYLASE in the last 168 hours. No results for input(s): AMMONIA in the last 168 hours. CBC: Recent Labs  Lab 09/06/19 0325 09/08/19 0415 09/10/19 0954 09/11/19 0421  WBC 5.3 6.7 7.3 6.6  NEUTROABS 3.6  --  5.8 4.6  HGB 7.3* 7.7* 7.7* 7.6*  HCT 23.1* 23.3* 23.8* 23.2*  MCV 93.1 91.0 89.1 89.6  PLT 230 281 252 245   Cardiac Enzymes: No results for input(s): CKTOTAL, CKMB, CKMBINDEX, TROPONINI in the last 168 hours. BNP: BNP (last 3 results) No results for input(s): BNP in the last 8760 hours.  ProBNP (last 3 results) No results for input(s): PROBNP in the last 8760 hours.  CBG: Recent Labs  Lab 09/08/19 0000 09/08/19 0746 09/08/19 2307 09/09/19 0552 09/09/19 1149  GLUCAP 111* 124* 97 122* 110*       Signed:  Rhetta MuraJai-Gurmukh Daton Szilagyi MD   [facility] 09/11/2019, 9:42 AM

## 2019-09-11 NOTE — Telephone Encounter (Signed)
-----   Message from Lemar Lofty., MD sent at 09/11/2019  5:27 AM EDT ----- Regarding: Follow-up Gary Atkinson,This is an inpatient will be discharged likely on Thursday versus Friday.This patient needs a CT scan in approximately 3 to 4 weeks.This needs to be with IV and oral contrast.Please place this under Dr. Lanetta Inch name and help on arranging.Follow-up with one of our APP's or Dr. Adela Lank after the CT scan has been complete.I will be available to help with interpretation of CT imaging as well.Thanks.GM

## 2019-09-12 ENCOUNTER — Other Ambulatory Visit: Payer: Self-pay

## 2019-09-12 DIAGNOSIS — K8689 Other specified diseases of pancreas: Secondary | ICD-10-CM

## 2019-09-12 NOTE — Telephone Encounter (Signed)
CT scan scheduled at [facility] radiology for 10/03/19 at 930 am arrive at 915 am NPO 4 hours  Please pick up contrast at least 2 days prior to the appt.

## 2019-09-12 NOTE — Telephone Encounter (Signed)
Left message on machine to call back  

## 2019-09-15 NOTE — Telephone Encounter (Signed)
The patient has been notified of this information and all questions answered.

## 2019-09-29 ENCOUNTER — Telehealth: Payer: Self-pay

## 2019-09-29 ENCOUNTER — Other Ambulatory Visit: Payer: Self-pay

## 2019-09-29 DIAGNOSIS — K219 Gastro-esophageal reflux disease without esophagitis: Secondary | ICD-10-CM

## 2019-09-29 DIAGNOSIS — K8689 Other specified diseases of pancreas: Secondary | ICD-10-CM

## 2019-09-29 MED ORDER — PANCRELIPASE (LIP-PROT-AMYL) 24000-76000 UNITS PO CPEP: 48000.0000 [IU] | ORAL_CAPSULE | Freq: Three times a day (TID) | ORAL | 6 refills | Status: AC

## 2019-09-29 MED ORDER — SUCRALFATE 1 G PO TABS: 1.0000 g | ORAL_TABLET | Freq: Four times a day (QID) | ORAL | 1 refills | Status: DC

## 2019-09-29 MED ORDER — DEXLANSOPRAZOLE 60 MG PO CPDR: 60.0000 mg | DELAYED_RELEASE_CAPSULE | Freq: Every day | ORAL | 1 refills | Status: DC

## 2019-09-29 NOTE — Telephone Encounter (Signed)
Resubmitted the PA for Dexilant.    Damyan Gartner [employer]Sylvan Cheese - PA Case ID: [id number] - Rx #: [id number] Approved for Dexilant 60MG  dr capsules once daily

## 2019-09-29 NOTE — Telephone Encounter (Signed)
Sorry to hear this. He has failed omeprazole and protonix in years past, Dexilant has been working for him. Up to him in regards to what he wants to try, can use Nexium 40mg  BID if he hasn't tried that before, whichever his preference. Thanks

## 2019-09-29 NOTE — Telephone Encounter (Signed)
Dr. Mervyn Skeeters - This patient's insurance will not cover Dexilant. He saw Shanda Bumps in August but was taken to the hospital. Please advise. Thank you

## 2019-09-29 NOTE — Telephone Encounter (Signed)
Approval good from 09-29-19 through 09-28-20 [facility]. [employer] [id number]

## 2019-09-30 ENCOUNTER — Ambulatory Visit: Payer: 59 | Admitting: Family

## 2019-10-01 ENCOUNTER — Ambulatory Visit (HOSPITAL_COMMUNITY): Payer: 59

## 2019-10-02 ENCOUNTER — Ambulatory Visit (HOSPITAL_COMMUNITY)
Admission: RE | Admit: 2019-10-02 | Discharge: 2019-10-02 | Disposition: A | Discharge status: Home / Self Care | Payer: 59 | Source: Ambulatory Visit | Attending: Gastroenterology | Admitting: Gastroenterology

## 2019-10-02 ENCOUNTER — Other Ambulatory Visit: Payer: Self-pay

## 2019-10-02 DIAGNOSIS — K8689 Other specified diseases of pancreas: Secondary | ICD-10-CM | POA: Diagnosis present

## 2019-10-02 MED ORDER — IOHEXOL 300 MG/ML  SOLN
100.0000 mL | Freq: Once | INTRAMUSCULAR | Status: AC | PRN
  Administered 2019-10-02: 100 mL via INTRAVENOUS

## 2019-10-03 ENCOUNTER — Ambulatory Visit (HOSPITAL_COMMUNITY): Payer: 59

## 2019-10-07 ENCOUNTER — Other Ambulatory Visit: Payer: Self-pay

## 2019-10-07 DIAGNOSIS — K8689 Other specified diseases of pancreas: Secondary | ICD-10-CM

## 2019-10-10 ENCOUNTER — Other Ambulatory Visit: Payer: Self-pay

## 2019-10-10 ENCOUNTER — Telehealth: Payer: Self-pay | Admitting: Gastroenterology

## 2019-10-10 DIAGNOSIS — K8689 Other specified diseases of pancreas: Secondary | ICD-10-CM

## 2019-10-10 MED ORDER — FUROSEMIDE 20 MG PO TABS: 20.0000 mg | ORAL_TABLET | Freq: Every day | ORAL | 1 refills | Status: DC

## 2019-10-10 MED ORDER — FUROSEMIDE 20 MG PO TABS: 20.0000 mg | ORAL_TABLET | Freq: Two times a day (BID) | ORAL | 0 refills | Status: DC

## 2019-10-10 NOTE — Telephone Encounter (Signed)
See pt advice request. 

## 2019-10-10 NOTE — Telephone Encounter (Signed)
Gary Atkinson I just sent you a message about this via MYchart to the patient - he can take 20mg  lasix BID (previously I think taking it daily) please refill if he needs more, BMET next week. thanks

## 2019-10-13 ENCOUNTER — Ambulatory Visit: Payer: 59 | Admitting: Nurse Practitioner

## 2019-10-27 ENCOUNTER — Ambulatory Visit: Payer: 59 | Admitting: Family Medicine

## 2019-11-10 ENCOUNTER — Telehealth: Payer: Self-pay

## 2019-11-10 NOTE — Telephone Encounter (Signed)
LM for pt to call back regarding appt with Dr. Adela Lank this Wednesday, 11-10 at 8:10am. Dr. Mervyn Skeeters has a meeting that morning and would like to see if pt can come at 11:20am that morning.

## 2019-11-11 NOTE — Telephone Encounter (Signed)
Called and LM for pt's sister, Fonnie Birkenhead that we are trying to reach pt regarding his appt in the morning.

## 2019-11-11 NOTE — Telephone Encounter (Signed)
Called and left another message for pt that we need to move his appt that is scheduled for 8:10am on 11-10 (tomorrow morning) due to the fact that Dr. Adela Lank has a meeting.  He could come at 11:20am if that would work for him. Asked pt to please call us back asap.

## 2019-11-11 NOTE — Telephone Encounter (Signed)
Called and left another message for pt.  Dr. Lanetta Inch 9:00am appt on Wed 11-10 was Cancelled.  Pt can come at 9:00am or 11:20am tomorrow. Please call back to confirm.

## 2019-11-12 ENCOUNTER — Ambulatory Visit: Payer: 59 | Admitting: Gastroenterology

## 2019-11-12 NOTE — Telephone Encounter (Signed)
Called pt to see if he was planning on coming at 11:20am to see Dr. Adela Lank. He answered the phone and indicated that he could not come at 11:20am today due to work. He needs an appt first thing in the morning or last thing of the day.  He also indicated that he wanted [place] to remove his sister's contact information from his chart in that they no longer speak. Sister's information was removed from the chart and pt confirmed an appt with Dr. Adela Lank on Tuesday, 11-30 at4:20pm. Appt approved by Dr. Adela Lank.

## 2019-12-02 ENCOUNTER — Ambulatory Visit (INDEPENDENT_AMBULATORY_CARE_PROVIDER_SITE_OTHER): Payer: 59 | Admitting: Gastroenterology

## 2019-12-02 ENCOUNTER — Other Ambulatory Visit (INDEPENDENT_AMBULATORY_CARE_PROVIDER_SITE_OTHER): Payer: 59

## 2019-12-02 ENCOUNTER — Encounter: Payer: Self-pay | Admitting: Gastroenterology

## 2019-12-02 VITALS — BP 154/100 | HR 107 | Ht 71.0 in | Wt 150.2 lb

## 2019-12-02 DIAGNOSIS — D649 Anemia, unspecified: Secondary | ICD-10-CM

## 2019-12-02 DIAGNOSIS — Z8719 Personal history of other diseases of the digestive system: Secondary | ICD-10-CM | POA: Diagnosis not present

## 2019-12-02 DIAGNOSIS — K8689 Other specified diseases of pancreas: Secondary | ICD-10-CM

## 2019-12-02 NOTE — Patient Instructions (Addendum)
If you are age 51 or older, your body mass index should be between 23-30. Your Body mass index is 20.96 kg/m. If this is out of the aforementioned range listed, please consider follow up with your Primary Care Provider.  If you are age 41 or younger, your body mass index should be between 19-25. Your Body mass index is 20.96 kg/m. If this is out of the aformentioned range listed, please consider follow up with your Primary Care Provider.   Please go to the lab in the basement of our building to have lab work done as you leave today. Hit "B" for basement when you get on the elevator.  When the doors open the lab is on your left.  We will call you with the results. Thank you.  Due to recent changes in healthcare laws, you may see the results of your imaging and laboratory studies on MyChart before your provider has had a chance to review them.  We understand that in some cases there may be results that are confusing or concerning to you. Not all laboratory results come back in the same time frame and the provider may be waiting for multiple results in order to interpret others.  Please give [place] 48 hours in order for your provider to thoroughly review all the results before contacting the office for clarification of your results.   Continue Creon.  We will schedule you for a CT Abdomen and Pelvis at [facility] first thing in the morning and call you with an appointment.  You have been scheduled for a CT scan of the abdomen and pelvis at [facility], 1st floor Radiology. You are scheduled on ________________  at ________________. You should arrive 15 minutes prior to your appointment time for registration.  You have been given 2 bottles of contrast. The solution may taste better if refrigerated, but do NOT add ice or any other liquid to this solution. Shake well before drinking.   Please follow the written instructions below on the day of your exam:   1) Do not eat anything after ___________  (4 hours prior to your test)   2) Drink 1 bottle of contrast @ ___________ (2 hours prior to your exam)  Remember to shake well before drinking and do NOT pour over ice.     Drink 1 bottle of contrast @ ___________ (1 hour prior to your exam)   You may take any medications as prescribed with a small amount of water, if necessary. If you take any of the following medications: METFORMIN, GLUCOPHAGE, GLUCOVANCE, AVANDAMET, RIOMET, FORTAMET, [place] MET, JANUMET, GLUMETZA or METAGLIP, you MAY be asked to HOLD this medication 48 hours AFTER the exam.   The purpose of you drinking the oral contrast is to aid in the visualization of your intestinal tract. The contrast solution may cause some diarrhea. Depending on your individual set of symptoms, you may also receive an intravenous injection of x-ray contrast/dye. Plan on being at [facility] for 45 minutes or longer, depending on the type of exam you are having performed.   If you have any questions regarding your exam or if you need to reschedule, you may call [facility] at [phone] between the hours of 8:00 am and 5:00 pm, Monday-Friday.   Thank you for entrusting me with your care and for choosing [facility], Dr. [place] Cellar

## 2019-12-02 NOTE — Progress Notes (Signed)
HPI :  49 y/o male here for a follow up visit.  He is known to me for history of iron deficiency status post negative EGD, colonoscopy, capsule study at [facility] in 2016, history of GERD that has been difficult to control in the past.  Most recently was admitted from July 21 to August 4 for acute pancreatitis that was thought to be due to recent alcohol use.  Our service was consulted during that time, was discharged in stable condition.  He unfortunately over the next few weeks did poorly as an outpatient, developed fevers, and able to take p.o., with continued weight loss, as was readmitted to the hospital from August 25 to September 9.  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  He required tube feeding, IV antibiotics, was discharged with another few weeks of oral antibiotics.  During this time a prolonged hospitalization he developed worsening anemia to hemoglobin to the sevens.  He was eventually discharged in stable condition on Dexilant, Carafate, Creon.  We performed a follow-up CT scan on October 1 which showed some improvement in pancreatic and peripancreatic edema and fluid collection had decreased in size from 5.2 x 3.2 cm to 3.4 x 2.5 cm.  There is a concern for a fistulous communication to the stomach as well.  He previously was scheduled to see me a few weeks ago but we had to reschedule due to a scheduling error.  He states he has been doing much better lately.  He has been eating okay on his own, at least two meals a day, sometimes he just does not have an appetite to eat three meals a day.  At his lowest weight he was 133 pounds during his second hospitalization, he is now 250 pounds.  He is taking Creon and tolerating that well although ran out for a bit of time recently due to inability to get a prescription but he has been resuming it the past few days.  He has some pain in his left upper quadrant that comes  and goes.  He takes Tylenol as needed which controls it.  He was given some meloxicam for what sounds like plantar fasciitis but he has since stopped that.  He has been taking Dexilant as needed for reflux but it actually has not been bothering him too much lately.  He has been using Carafate as well as needed but again not using it too much.  He had followed up with his primary care in [facility] at one point, he had a ferritin level of eight hundred and eighty-eight with a total iron of sixty, TIBC one fifty-two with a hemoglobin of 9.2 after his discharge in September.  Apparently he was referred to a hematologist for possible phlebotomy given his elevated ferritin level.  I had suspected that was more likely due to an acute phase reactant from his pancreatitis and discouraged him from having phlebotomy given his ongoing anemia.  He never followed up with oncology for that.  He states he has not had a drop of alcohol since this first occurred.  He states he never really drank heavily on a routine basis.  He had a social golf trip the week prior to his first episode of pancreatitis and had been drinking at that time.  Typically he does not drink heavily and has a few beers occasionally prior to this.  He has had multiple imaging studies of his pancreas on review of chart in  epic.  Ultrasound has not shown any gallstones.  MRCP showed no evidence of choledocholithiasis or biliary ductal dilation.  He is rather frustrated that his family has labeled him as an alcoholic or having an alcohol problem and this is caused significant relationship issues with his family.  Of note he has a history of coronary stenting 10 to 15 years ago, he was stopped on baby aspirin previously during his hospitalization.  He is currently not taking anything for that right now.    CT 10/03/19 - CONTRAST:  OMNIPAQUE IOHEXOL 300 MG/ML  SOLN  COMPARISON:  09/09/2019  FINDINGS: Lower chest: Mild bibasilar atelectasis. Mild  cardiomegaly with new small pericardial effusion. Small bilateral pleural effusions are similar.  Hepatobiliary: Moderate hepatic steatosis, without focal liver lesion. Normal gallbladder, without biliary ductal dilatation.  Pancreas: Similar to minimal improvement in pancreatic and peripancreatic edema. The peripancreatic gas adjacent the head and neck have resolved. Trace fluid is seen within this area on 28/2, but no well-defined new collection.  The peripherally enhancing collection within or abutting the ventral pancreatic tail measures 3.4 x 2.5 cm on 26/2, decreased from 5.2 x 3.2 cm on the prior exam (when remeasured). Fistulous communication to the stomach remains, including on 24/2.  No new peripancreatic collections.  Spleen: Normal in size, without focal abnormality.  Adrenals/Urinary Tract: Normal adrenal glands. Normal urinary bladder.  Stomach/Bowel: Greater curvature gastric wall thickening again identified, relatively mild. There is also a mild, decreased edema within the duodenal C loop, presumably secondary.  Otherwise normal small bowel.  Scattered colonic diverticula.  Normal terminal ileum.  Vascular/Lymphatic: Aortic atherosclerosis. 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 Patent portal and superior mesenteric veins. No arterial complication. No abdominopelvic adenopathy.  Reproductive: Normal prostate.  Other: Resolved pelvic fluid.  No free intraperitoneal air.  Musculoskeletal: Disc bulges at L4-5 and less so at L5-S1.  IMPRESSION: 1. Improved complicated pancreatitis. Resolution of previously described peripancreatic gas adjacent the head/neck. 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 2. Persistent, slightly improved presumably secondary gastritis and duodenitis. 3. Resolved pelvic fluid. 4. Similar small bilateral pleural  effusions with new small pericardial effusion. 5. Hepatic steatosis   EGD 12/17/18 -  - A 1 cm hiatal hernia was present. - The exam of the esophagus was otherwise normal. No Barrett's esophagus or esophagitis. - Biopsies were taken with a cold forceps in the upper third of the esophagus, in the middle third of the esophagus and in the lower third of the esophagus for histology. - Patchy mildly erythematous mucosa was found in the gastric body and in the gastric antrum without focal ulceration. - The exam of the stomach was otherwise normal. Retroflexed views of the cardia [place] grade I to II - Biopsies were taken with a cold forceps in the gastric body, at the incisura and in the gastric antrum for Helicobacter pylori testing. - The duodenal bulb and second portion of the duodenum were normal.   Past Medical History:  Diagnosis Date  . Acute pancreatitis   . Allergic rhinitis   . Anxiety   . Asthma   . CAD (coronary artery disease)   . Depression   . GERD (gastroesophageal reflux disease)   . Hyperlipemia   . Hypertension   . MI (myocardial infarction) (HCC)   . Pneumonia      Past Surgical History:  Procedure Laterality Date  . APPENDECTOMY    . CARDIAC CATHETERIZATION  with 2 stents   Family History  Problem Relation Age of Onset  . Liver disease Father   . Alcoholism Father    Social History   Tobacco Use  . Smoking status: Former Smoker    Types: Cigarettes    Quit date: 2002    Years since quitting: 19.9  . Smokeless tobacco: Never Used  Vaping Use  . Vaping Use: Never used  Substance Use Topics  . Alcohol use: Not Currently  . Drug use: Never   Current Outpatient Medications  Medication Sig Dispense Refill  . acetaminophen (TYLENOL) 325 MG tablet Take 650 mg by mouth every 6 (six) hours as needed for mild pain or headache.    . busPIRone (BUSPAR) 10 MG tablet Take 30 mg by mouth at bedtime.     . clonazePAM (KLONOPIN) 1 MG disintegrating tablet  Take 1 mg by mouth at bedtime.     Marland Kitchen. dexlansoprazole (DEXILANT) 60 MG capsule Take 1 capsule (60 mg total) by mouth daily. 30 capsule 1  . loperamide (IMODIUM) 2 MG capsule Take 1 capsule (2 mg total) by mouth as needed for diarrhea or loose stools. 30 capsule 0  . Pancrelipase, Lip-Prot-Amyl, 24000-76000 units CPEP Take 2 capsules (48,000 Units total) by mouth 3 (three) times daily before meals. 270 capsule 6  . nitroGLYCERIN (NITROSTAT) 0.4 MG SL tablet Place 0.4 mg under the tongue as needed for chest pain.  (Patient not taking: Reported on 12/02/2019)     No current facility-administered medications for this visit.   No Known Allergies   Review of Systems: All systems reviewed and negative except where noted in HPI.    Lab Results  Component Value Date   WBC 6.6 09/11/2019   HGB 7.6 (L) 09/11/2019   HCT 23.2 (L) 09/11/2019   MCV 89.6 09/11/2019   PLT 245 09/11/2019    Lab Results  Component Value Date   CREATININE 0.46 (L) 09/11/2019   BUN 9 09/11/2019   NA 134 (L) 09/11/2019   K 3.9 09/11/2019   CL 102 09/11/2019   CO2 24 09/11/2019    Lab Results  Component Value Date   ALT 42 09/11/2019   AST 22 09/11/2019   ALKPHOS 79 09/11/2019   BILITOT 0.5 09/11/2019    Physical Exam: BP (!) 154/100   Pulse (!) 107   Ht 5\' 11"  (1.803 m)   Wt 150 lb 4 oz (68.2 kg)   BMI 20.96 kg/m  Constitutional: Pleasant, male in no acute distress. Abdominal: Soft, nondistended, mild TTP along left upper costal margin. There are no masses palpable.  Extremities: no edema Lymphadenopathy: No cervical adenopathy noted. Neurological: Alert and oriented to person place and time. Skin: Skin is warm and dry. No rashes noted. Psychiatric: Normal mood and affect. Behavior is normal.   ASSESSMENT AND PLAN: 49 year old male here for reassessment of the following:  History of pancreatitis / pancreatic necrosis - prolonged hospitalization with two admissions over August / September as  outlined.  Severe pancreatitis suspected to be due to alcohol, no evidence of gallstones on his work-up.  He had evidence of necrosis with fluid collection, thrombosis of splenic vein with concern for possible gastric varices.  His last CT scan showed improvement of the inflammation and reduction in size of fluid collections.  He is generally feeling much better and is able to function now, due to start a new job next week which he is very excited about.  He has not had any alcohol since  this occurrence and recommend he continue to abstain from that.  He still has a ways to go to regain to his normal weight, recommend that he try to eat three meals a day as best he can continue with his Creon supplementation.  I have spoken with advanced endoscopy about his case previously.  I do recommend a follow-up CT scan of his abdomen pelvis to reevaluate his pancreas, assess state of fluid collections and ensure resolution given his intermittent pain in his upper abdomen.  Also want to see status of his splenic vein and presence of gastric varices, in regards to whether or not he needs treatment for that.  Counseled him that this whole process may take few more months to resolve.  Very generally am happy with his progress so far and he appears to be healing.  Further recommendations pending results of his CT scan.  I will repeat his LFTs today CBC, ferritin to ensure stable.  He agreed he should avoid NSAIDs moving forward.  Anemia - became severely anemic due to prolonged hospitalization.  Labs reviewed at [facility], he had a very elevated ferritin shortly after his discharge, suspect acute phase reactant in light of his pancreatic necrosis.  He was recommended to have phlebotomy at one point in time which I recommended against given this was more than likely an acute phase reactant..  I will repeat his CBC today to check status of his anemia, we will also repeat iron studies, with time expect his ferritin to go back to  normal.  In fact he has a history of iron deficiency status post negative endoscopic evaluation at [facility] in twenty sixteen.  He agreed  GERD - doing much better in recent months with this, previously had significant symptoms, now using Dexilant only as needed and avoiding trigger foods.  He can follow-up as needed for this issue continue Dexilant as needed.  Of note, I do recommend that he follow-up with cardiology now that he is recovered from this course.  I think it is okay to resume his aspirin as he is history of coronary stenting, will await labs as above.  Ileene Patrick, MD [facility]

## 2019-12-03 ENCOUNTER — Telehealth: Payer: Self-pay

## 2019-12-03 LAB — COMPREHENSIVE METABOLIC PANEL
ALT: 31 U/L (ref 0–53)
AST: 29 U/L (ref 0–37)
Albumin: 3.9 g/dL (ref 3.5–5.2)
Alkaline Phosphatase: 63 U/L (ref 39–117)
BUN: 21 mg/dL (ref 6–23)
CO2: 26 mEq/L (ref 19–32)
Calcium: 8.8 mg/dL (ref 8.4–10.5)
Chloride: 105 mEq/L (ref 96–112)
Creatinine, Ser: 1.31 mg/dL (ref 0.40–1.50)
GFR: 63.06 mL/min (ref >60.00)
Glucose, Bld: 86 mg/dL (ref 70–99)
Potassium: 3.8 mEq/L (ref 3.5–5.1)
Sodium: 141 mEq/L (ref 135–145)
Total Bilirubin: 0.4 mg/dL (ref 0.2–1.2)
Total Protein: 6.6 g/dL (ref 6.0–8.3)

## 2019-12-03 LAB — CBC WITH DIFFERENTIAL/PLATELET
Basophils Absolute: 0.1 10*3/uL (ref 0.0–0.1)
Basophils Relative: 1.2 % (ref 0.0–3.0)
Eosinophils Absolute: 0.3 10*3/uL (ref 0.0–0.7)
Eosinophils Relative: 5.5 % — ABNORMAL HIGH (ref 0.0–5.0)
HCT: 36.2 % — ABNORMAL LOW (ref 39.0–52.0)
Hemoglobin: 12.1 g/dL — ABNORMAL LOW (ref 13.0–17.0)
Lymphocytes Relative: 21.4 % (ref 12.0–46.0)
Lymphs Abs: 1.3 10*3/uL (ref 0.7–4.0)
MCHC: 33.4 g/dL (ref 30.0–36.0)
MCV: 86.2 fl (ref 78.0–100.0)
Monocytes Absolute: 0.3 10*3/uL (ref 0.1–1.0)
Monocytes Relative: 5 % (ref 3.0–12.0)
Neutro Abs: 4 10*3/uL (ref 1.4–7.7)
Neutrophils Relative %: 66.9 % (ref 43.0–77.0)
Platelets: 176 10*3/uL (ref 150.0–400.0)
RBC: 4.2 Mil/uL — ABNORMAL LOW (ref 4.22–5.81)
RDW: 13.5 % (ref 11.5–15.5)
WBC: 6 10*3/uL (ref 4.0–10.5)

## 2019-12-03 LAB — IBC + FERRITIN
Ferritin: 186.4 ng/mL (ref 22.0–322.0)
Iron: 71 ug/dL (ref 42–165)
Saturation Ratios: 21.4 % (ref 20.0–50.0)
Transferrin: 237 mg/dL (ref 212.0–360.0)

## 2019-12-03 NOTE — Telephone Encounter (Signed)
Called and scheduled pt for CT at [facility] on Friday, 12-12-19 at 7:30am, arr 7:15. NPO 4 hours, Drink at 5:30am and 6:30am.  Sent pt My Chart message with instructions.

## 2019-12-04 ENCOUNTER — Other Ambulatory Visit: Payer: Self-pay

## 2019-12-04 MED ORDER — FUROSEMIDE 20 MG PO TABS: 20.0000 mg | ORAL_TABLET | Freq: Every day | ORAL | 0 refills | Status: DC

## 2019-12-04 MED ORDER — FUROSEMIDE 20 MG PO TABS: 20.0000 mg | ORAL_TABLET | Freq: Every day | ORAL | 0 refills | Status: AC

## 2019-12-04 NOTE — Progress Notes (Signed)
Refill of lasix 20mg  once daily sent to pharmacy. See Mychart message

## 2019-12-12 ENCOUNTER — Encounter (HOSPITAL_COMMUNITY): Payer: Self-pay

## 2019-12-12 ENCOUNTER — Ambulatory Visit (HOSPITAL_COMMUNITY)
Admission: RE | Admit: 2019-12-12 | Discharge: 2019-12-12 | Disposition: A | Discharge status: Home / Self Care | Payer: 59 | Source: Ambulatory Visit | Attending: Gastroenterology | Admitting: Gastroenterology

## 2019-12-12 ENCOUNTER — Other Ambulatory Visit: Payer: Self-pay

## 2019-12-12 DIAGNOSIS — Z8719 Personal history of other diseases of the digestive system: Secondary | ICD-10-CM | POA: Diagnosis present

## 2019-12-12 DIAGNOSIS — K8689 Other specified diseases of pancreas: Secondary | ICD-10-CM | POA: Insufficient documentation

## 2019-12-12 MED ORDER — SODIUM CHLORIDE (PF) 0.9 % IJ SOLN
INTRAMUSCULAR | Status: AC
  Filled 2019-12-12: qty 50

## 2019-12-12 MED ORDER — IOHEXOL 300 MG/ML  SOLN
100.0000 mL | Freq: Once | INTRAMUSCULAR | Status: AC | PRN
  Administered 2019-12-12: 100 mL via INTRAVENOUS

## 2019-12-16 ENCOUNTER — Other Ambulatory Visit: Payer: Self-pay

## 2019-12-16 MED ORDER — TRAMADOL HCL 50 MG PO TABS
50.0000 mg | ORAL_TABLET | Freq: Three times a day (TID) | ORAL | 0 refills | Status: DC | PRN
Start: 2019-12-16 — End: 2019-12-16

## 2019-12-16 MED ORDER — TRAMADOL HCL 50 MG PO TABS
50.0000 mg | ORAL_TABLET | Freq: Three times a day (TID) | ORAL | 0 refills | Status: DC | PRN
Start: 2019-12-16 — End: 2020-03-18

## 2020-01-20 ENCOUNTER — Other Ambulatory Visit: Payer: Self-pay

## 2020-01-20 DIAGNOSIS — K8689 Other specified diseases of pancreas: Secondary | ICD-10-CM

## 2020-01-20 MED ORDER — DEXLANSOPRAZOLE 60 MG PO CPDR: 60.0000 mg | DELAYED_RELEASE_CAPSULE | Freq: Every day | ORAL | 0 refills | Status: DC

## 2020-01-20 NOTE — Progress Notes (Signed)
Refill request via fax for Dexilant

## 2020-01-23 ENCOUNTER — Other Ambulatory Visit: Payer: Self-pay

## 2020-01-23 MED ORDER — DEXLANSOPRAZOLE 60 MG PO CPDR: 60.0000 mg | DELAYED_RELEASE_CAPSULE | Freq: Every day | ORAL | 0 refills | Status: DC

## 2020-03-17 NOTE — Telephone Encounter (Signed)
Jess see the message regarding tramadol

## 2020-03-18 ENCOUNTER — Other Ambulatory Visit: Payer: Self-pay

## 2020-03-18 ENCOUNTER — Telehealth: Payer: Self-pay

## 2020-03-18 MED ORDER — TRAMADOL HCL 50 MG PO TABS: 50.0000 mg | ORAL_TABLET | Freq: Three times a day (TID) | ORAL | 0 refills | Status: AC | PRN

## 2020-03-18 NOTE — Telephone Encounter (Signed)
Gary Atkinson - FYI Tramadol required PA.  PA has been submitted and approved. In talking with Dr. Adela Lank he indicated he wanted patient scheduled for CT abd and pelvis w contrast and is requesting that you please contact patient directly to get him scheduled.  Thank you.

## 2020-03-18 NOTE — Telephone Encounter (Signed)
PA for Tramadol 50 mg q8h prn submitted via CoverMyMeds for abdominal pain and Hx of pancreatitis.  PA approved. Key: [id number] - PA Case ID: [id number] - Rx #: [id number]

## 2020-03-19 NOTE — Telephone Encounter (Signed)
I spoke with the pt and he is unwilling to schedule at this time and was asked to call when his ready to set the appt up.  He tells me he lives out of the area and will need to call when he has an idea when he can have the CT.

## 2020-03-22 ENCOUNTER — Telehealth: Payer: Self-pay

## 2020-03-22 NOTE — Telephone Encounter (Signed)
Called and LM for patient to call back to discuss scheduling CT scan for Dr. Adela Lank. Patient indicated in MyChart message that he is now in [place], [place] and [place] is not convenient for him to have scan. We still have  address in his chart so we need to update address and ask patient where he would like to have scan.

## 2020-03-24 NOTE — Telephone Encounter (Signed)
LM for patient to call back to discuss scheduling CT and updating address in his chart.

## 2020-06-14 ENCOUNTER — Other Ambulatory Visit: Payer: Self-pay | Admitting: Gastroenterology

## 2020-11-30 ENCOUNTER — Other Ambulatory Visit: Payer: Self-pay

## 2020-11-30 MED ORDER — DEXLANSOPRAZOLE 60 MG PO CPDR: 60.0000 mg | DELAYED_RELEASE_CAPSULE | Freq: Every day | ORAL | 1 refills | Status: AC

## 2021-12-09 IMAGING — DX DG CHEST 1V PORT
1 series · 1 of 1 positions shown · non-contrast
Comparison: None.

CLINICAL DATA: Vomiting, epigastric pain

EXAM:
PORTABLE CHEST 1 VIEW

[chest ap]
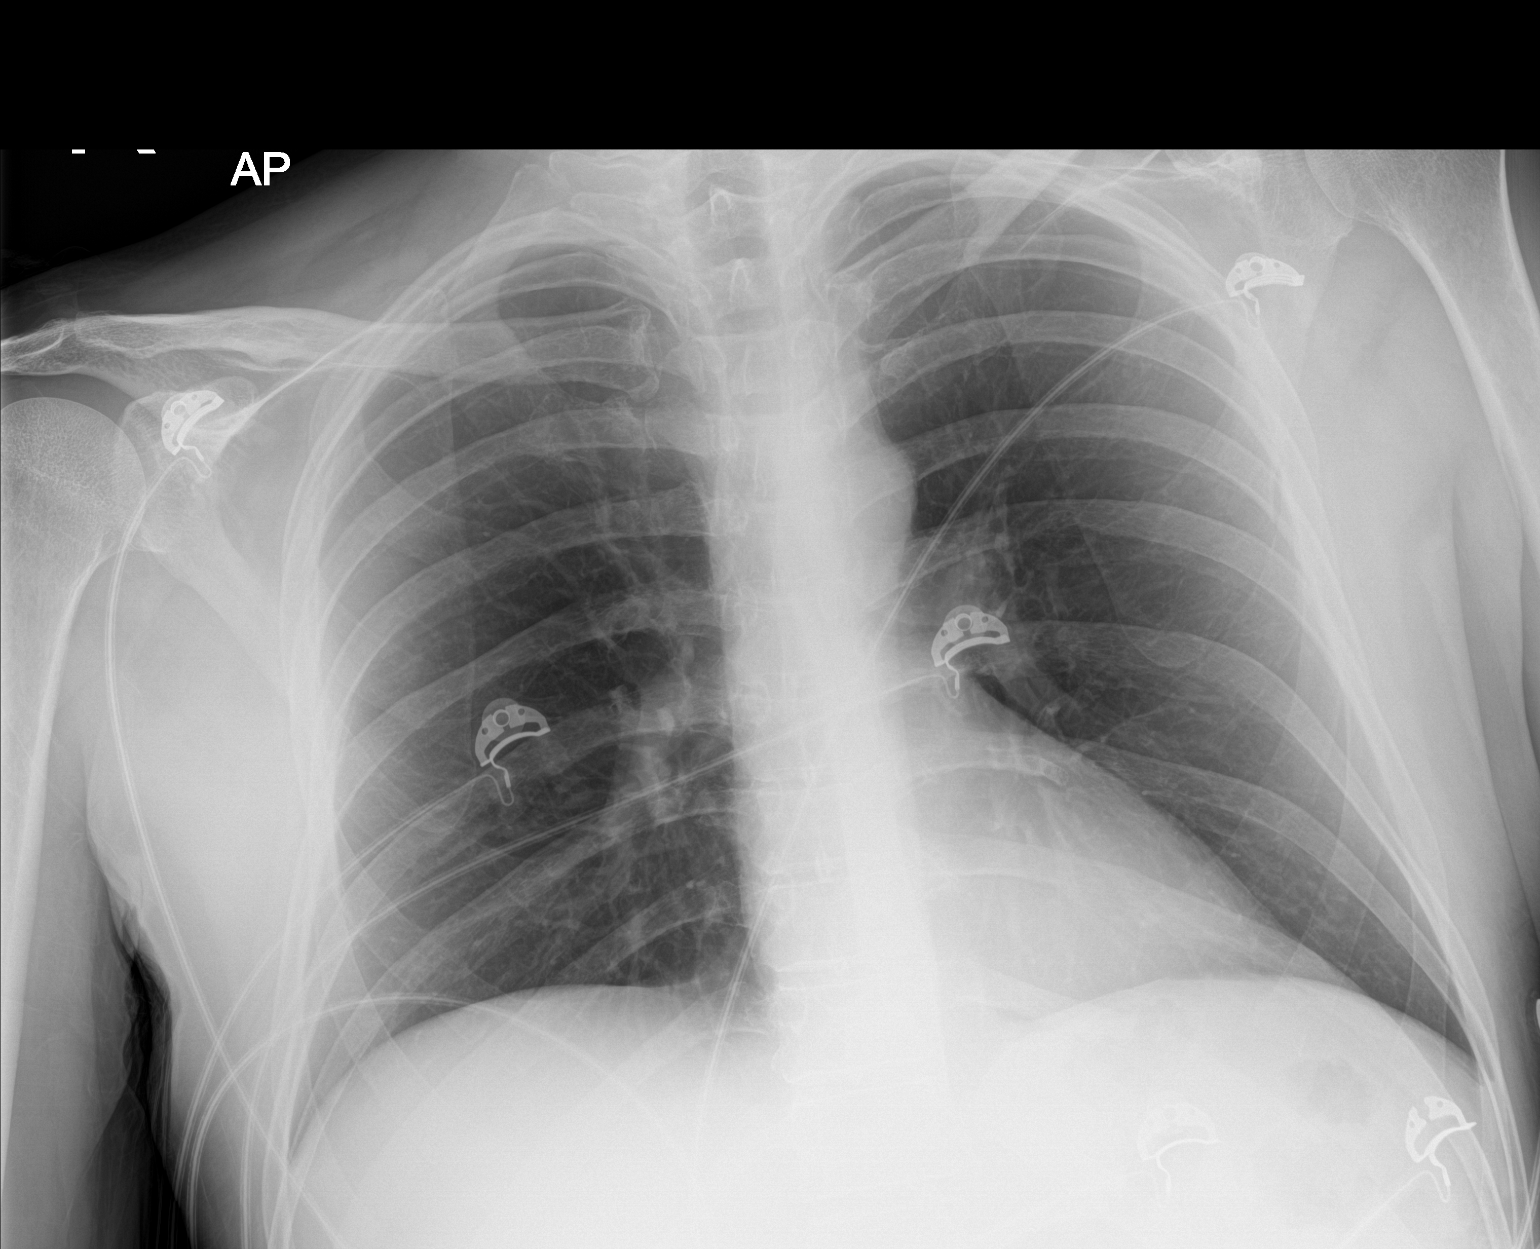

[1 of 1 positions shown; findings below may reference images not displayed]

FINDINGS: Lungs are clear. No pneumothorax or pleural effusion. Coronary
artery stenting has been performed. Cardiac size within normal
limits. The pulmonary vascularity is normal. No acute bone
abnormality.
IMPRESSION: No active disease.

## 2021-12-11 IMAGING — US US ABDOMEN LIMITED
1 series · 14 of 25 positions shown · non-contrast
Comparison: Abdominal MRI 07/24/2019

CLINICAL DATA: Pancreatitis.

EXAM:
ULTRASOUND ABDOMEN LIMITED RIGHT UPPER QUADRANT

[Series 1: us abdomen limited · 14 of 73 slices shown]
[im 1/73]
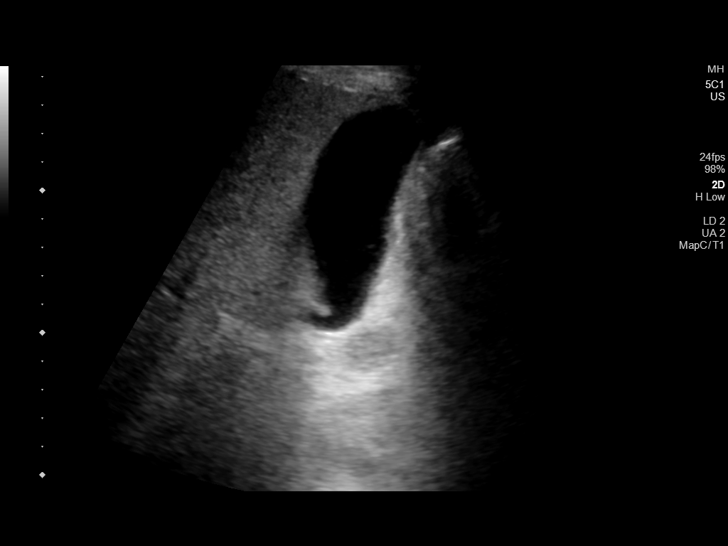
[im 7/73]
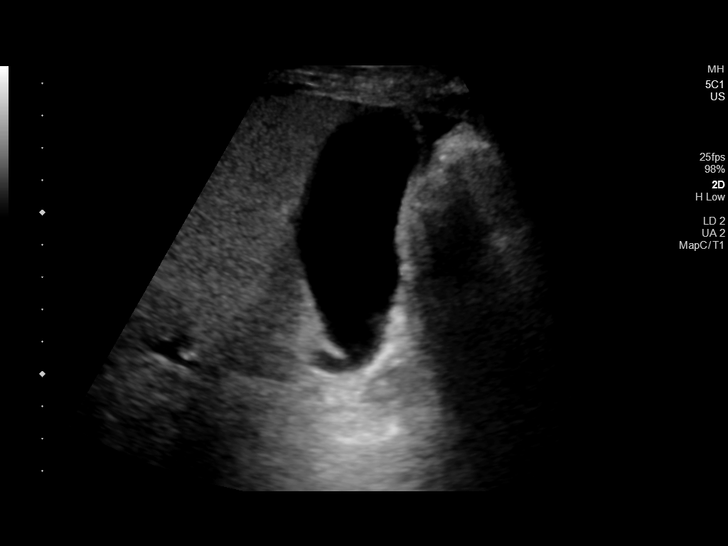
[im 13/73]
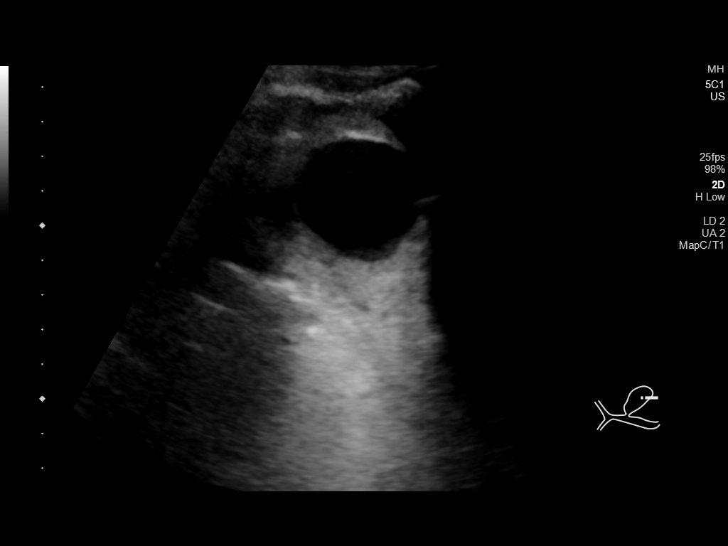
[im 19/73]
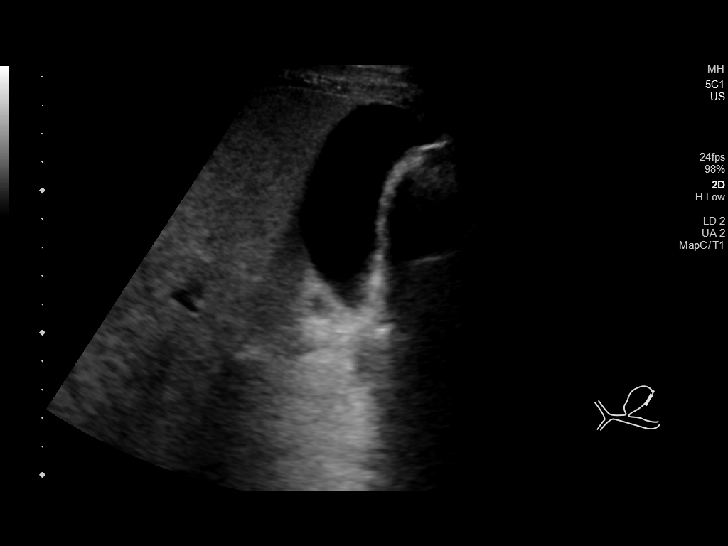
[im 25/73]
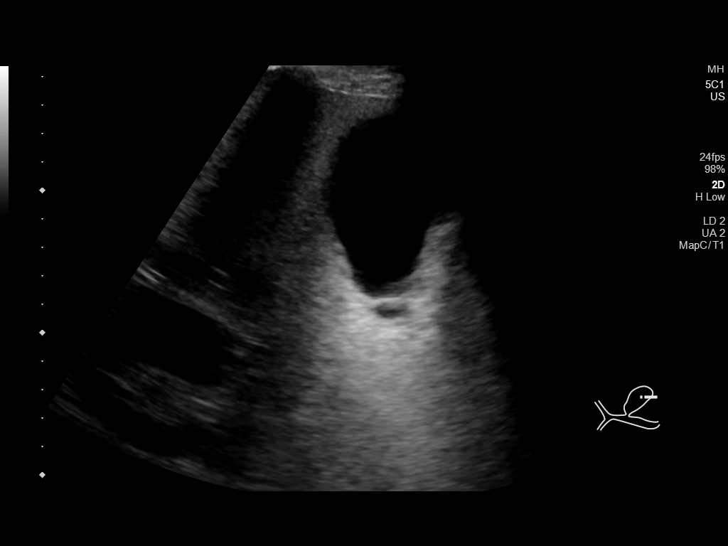
[im 28/73]
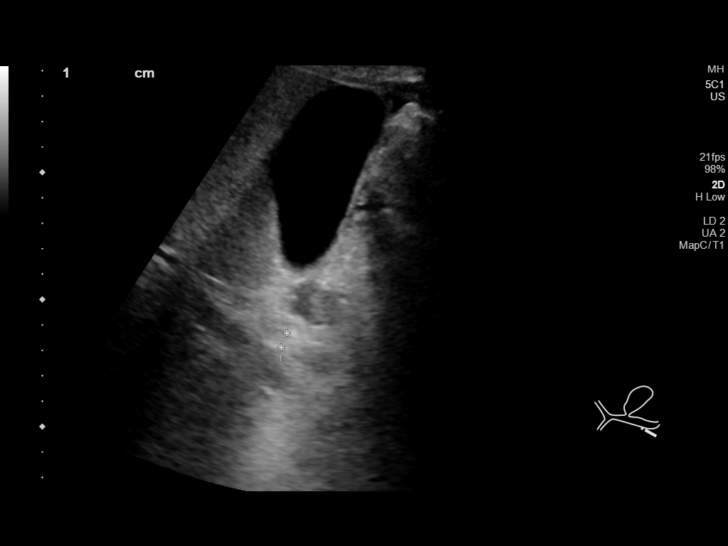
[im 34/73]
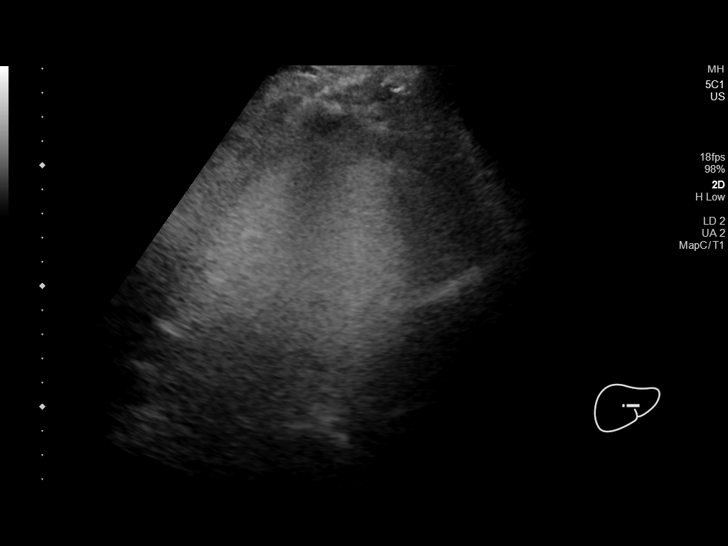
[im 40/73]
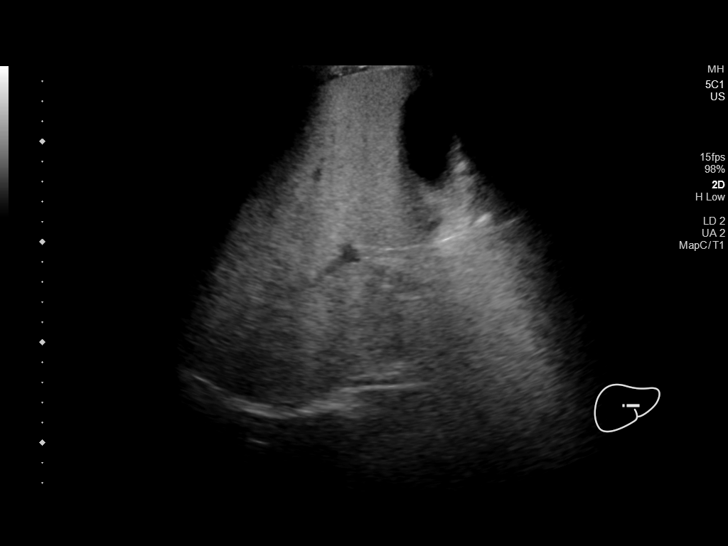
[im 46/73]
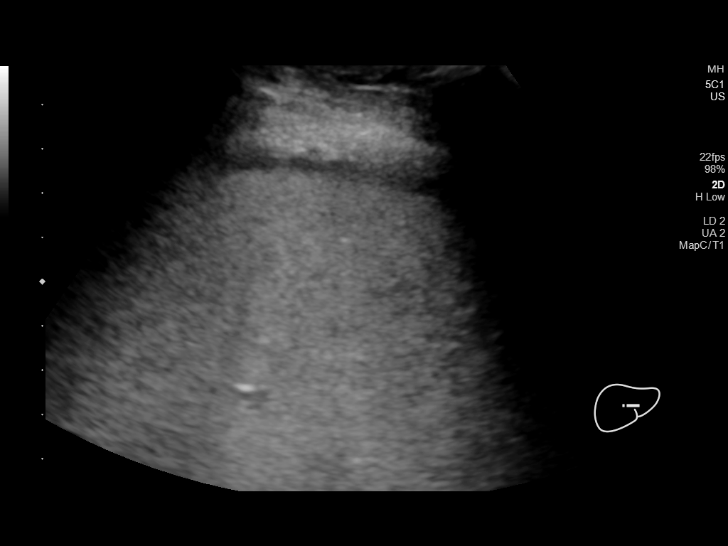
[im 49/73]
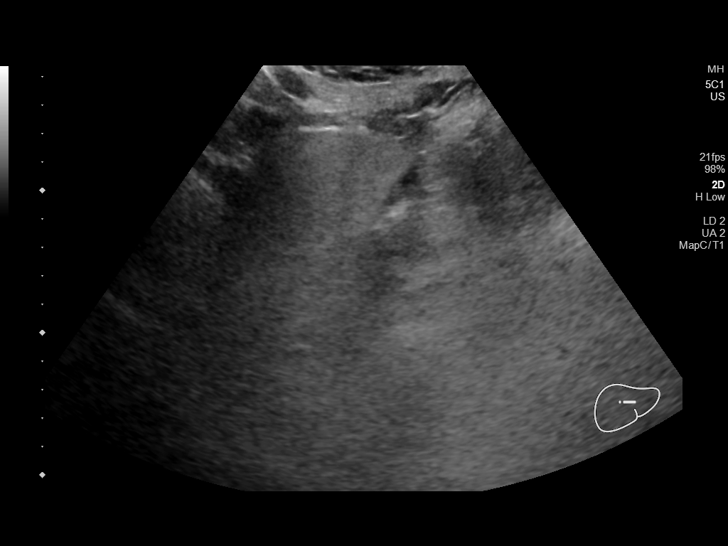
[im 55/73]
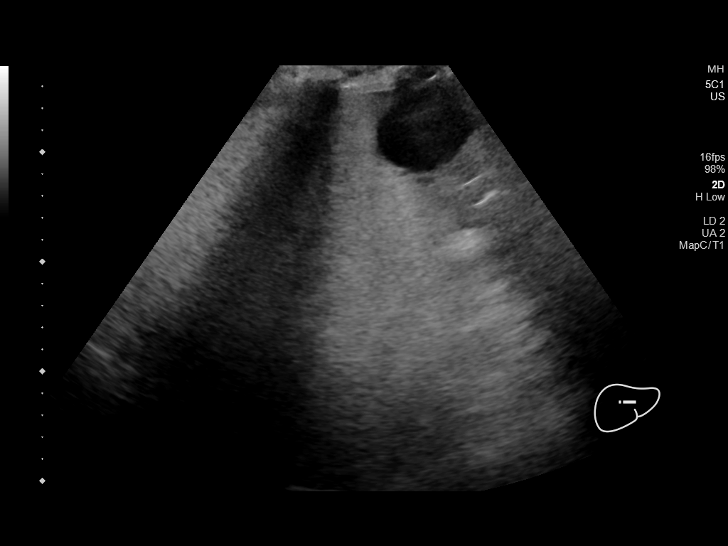
[im 61/73]
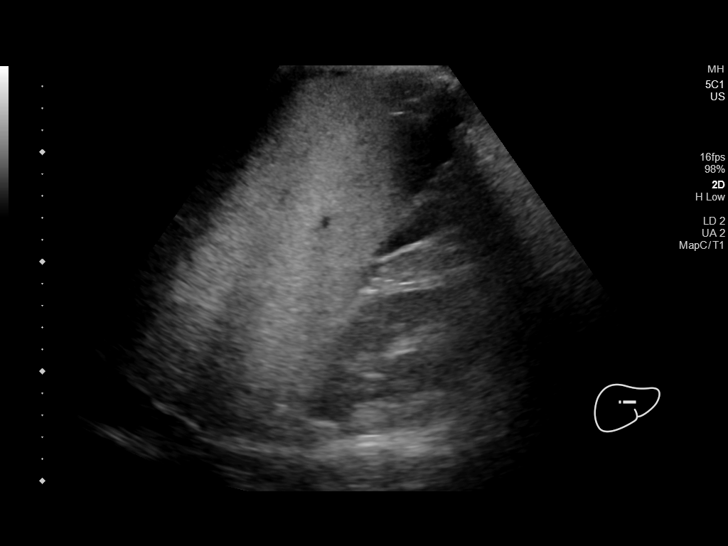
[im 67/73]
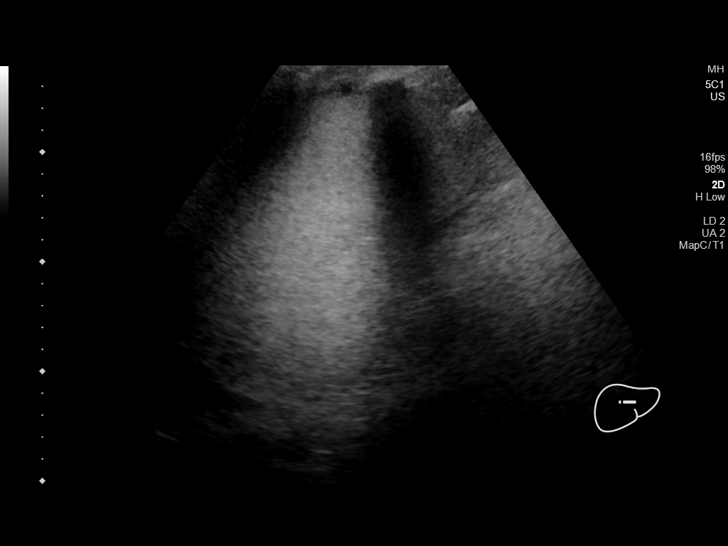
[im 73/73]
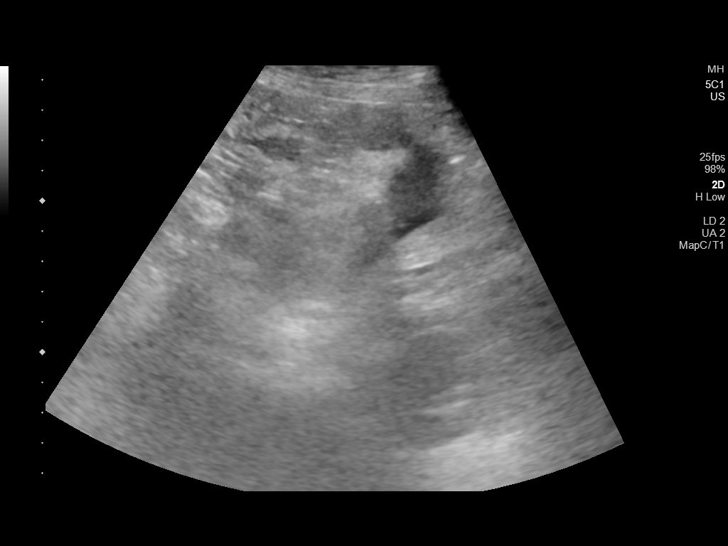

[14 of 25 positions shown; findings below may reference images not displayed]

FINDINGS: Gallbladder:

No gallstones or wall thickening visualized. No sonographic Murphy
sign noted by sonographer.

Common bile duct:

Diameter: 6 mm

Liver:

Diffusely increased parenchymal echogenicity without a focal lesion
identified. Portal vein is patent on color Doppler imaging with
normal direction of blood flow towards the liver.

Other: Small volume perihepatic ascites and right pleural effusion.
IMPRESSION: 1. Hepatic steatosis.
2. No gallstones or biliary dilatation.
3. Small volume perihepatic ascites and right pleural effusion.

## 2022-01-13 IMAGING — CT CT ABD-PELV W/ CM
2 of 5 series · 14 of 46 positions shown, 16 images · IV contrast (omnipaque)
Comparison: Multiple prior studies, most recent comparison from
July 31, 2019

CLINICAL DATA: History of necrotic pancreatitis diagnosed in [REDACTED],
presents with abdominal pain.

EXAM:
CT ABDOMEN AND PELVIS WITH CONTRAST
TECHNIQUE: Multidetector CT imaging of the abdomen and pelvis was performed
using the standard protocol following bolus administration of
intravenous contrast.
CONTRAST:  100mL OMNIPAQUE IOHEXOL 300 MG/ML  SOLN

[Series 2: axial st · axial · 0.75mm/px · z∈[-460,-40]mm · 11 of 96 slices shown, 13 images]
[im 6/96  soft-tissue]
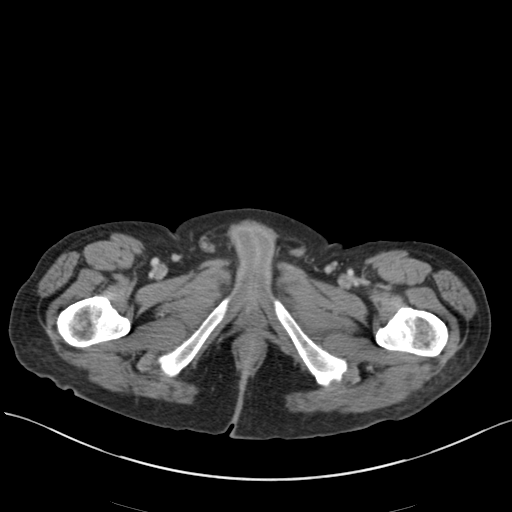
[im 6/96  bone]
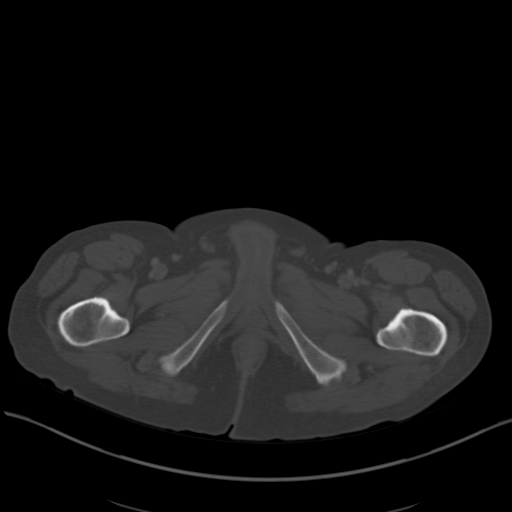
[im 18/96  soft-tissue]
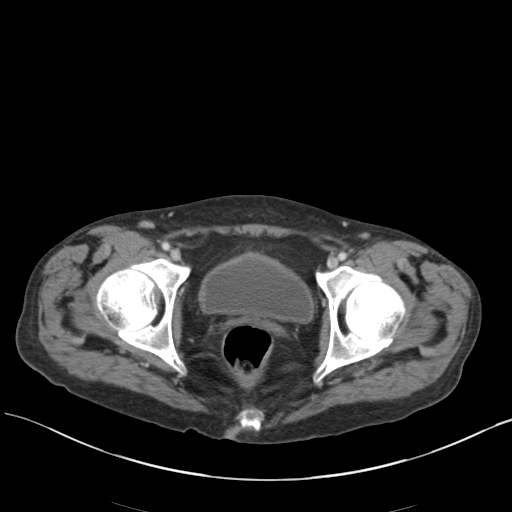
[im 24/96  soft-tissue]
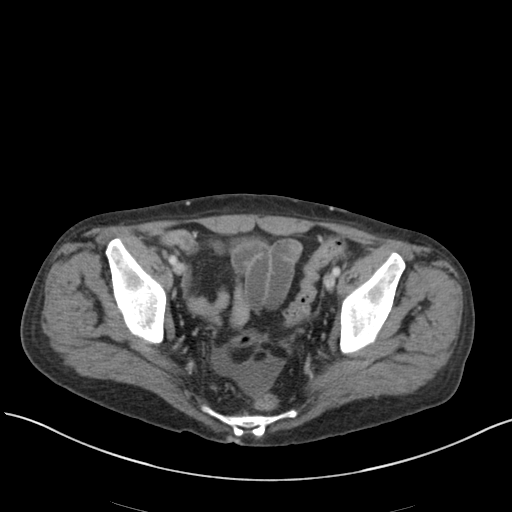
[im 30/96  soft-tissue]
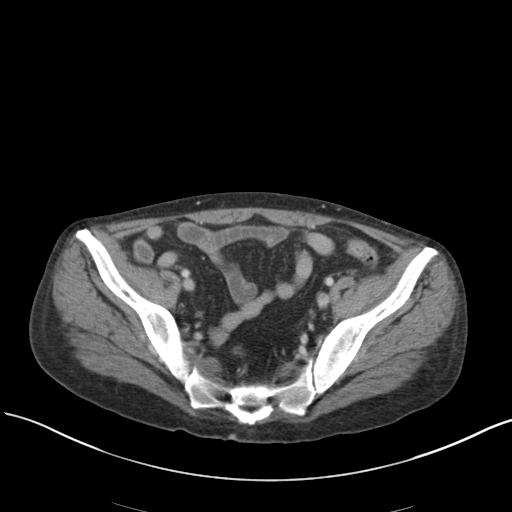
[im 42/96  soft-tissue]
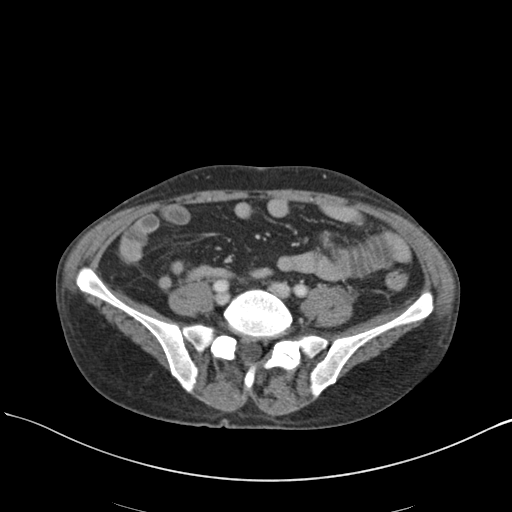
[im 48/96  soft-tissue]
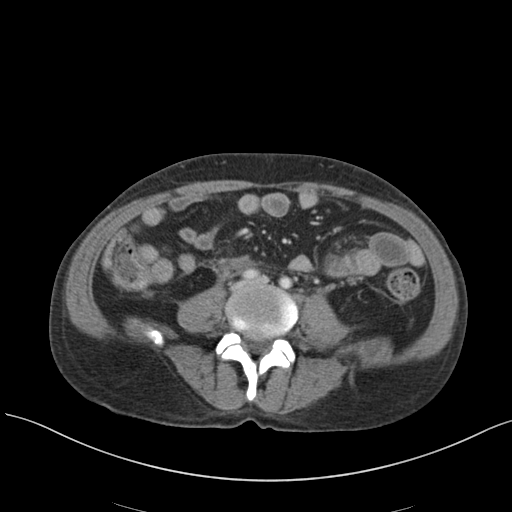
[im 54/96  soft-tissue]
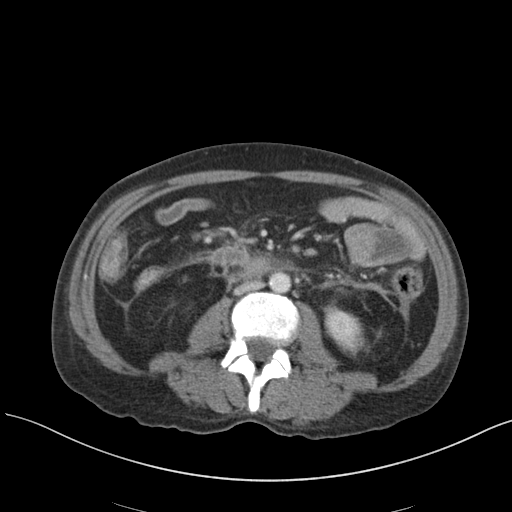
[im 66/96  soft-tissue]
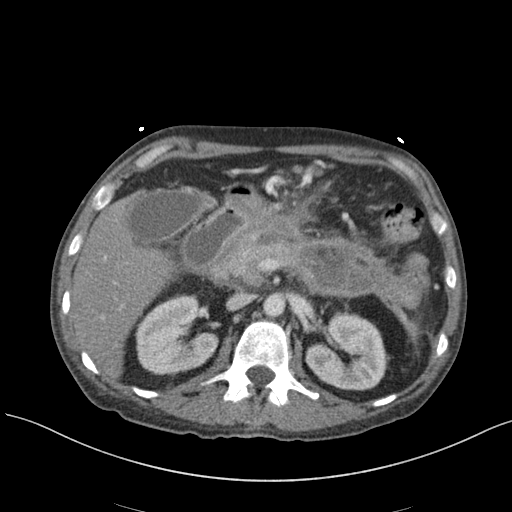
[im 72/96  soft-tissue]
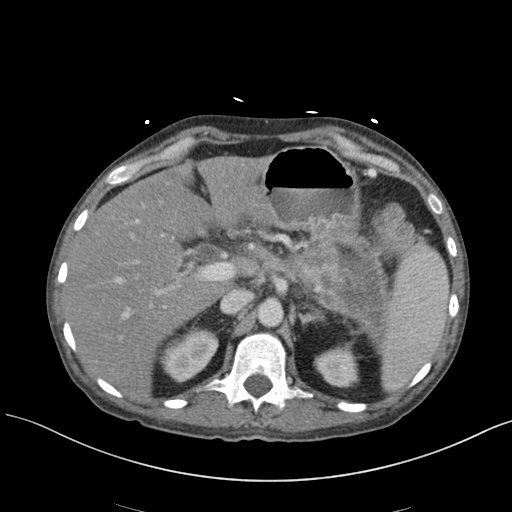
[im 72/96  bone]
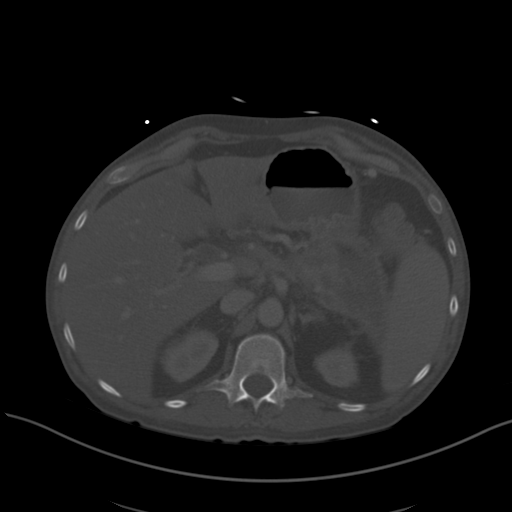
[im 78/96  soft-tissue]
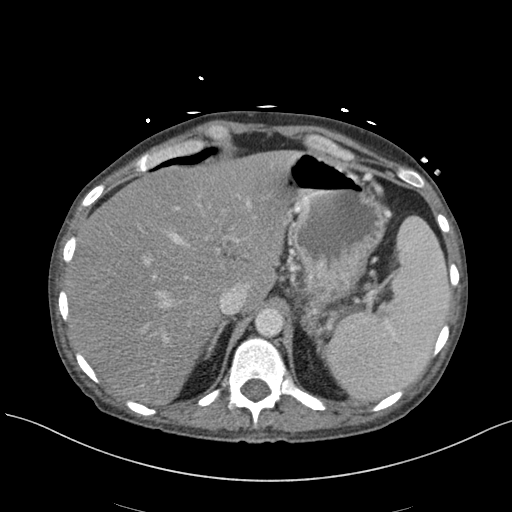
[im 90/96  soft-tissue]
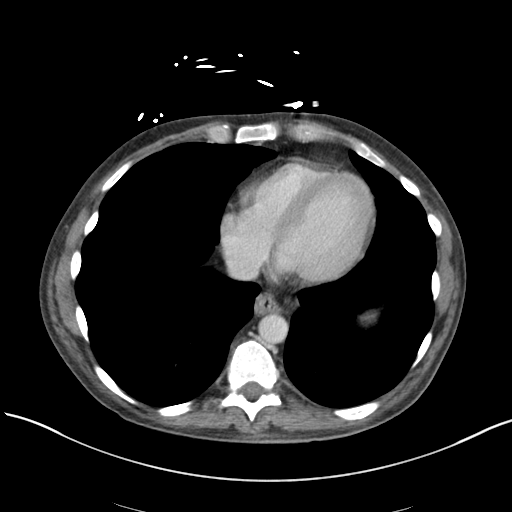

[Series 4: coronal st · coronal · 0.70mm/px · 3 of 131 slices shown]
[im 44/131  soft-tissue]
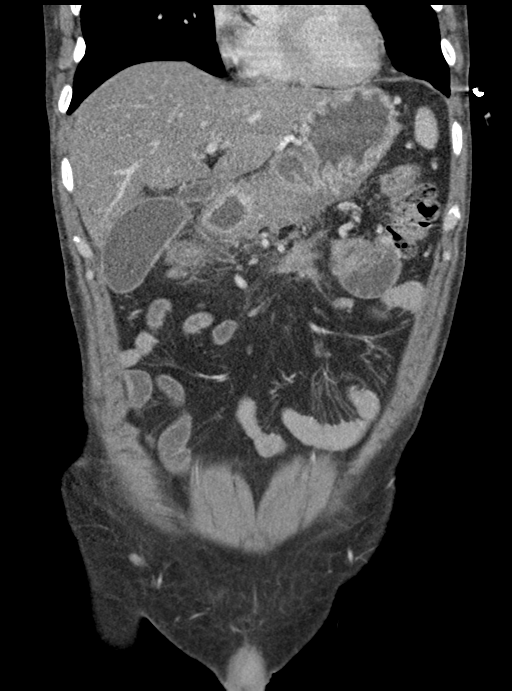
[im 58/131  soft-tissue]
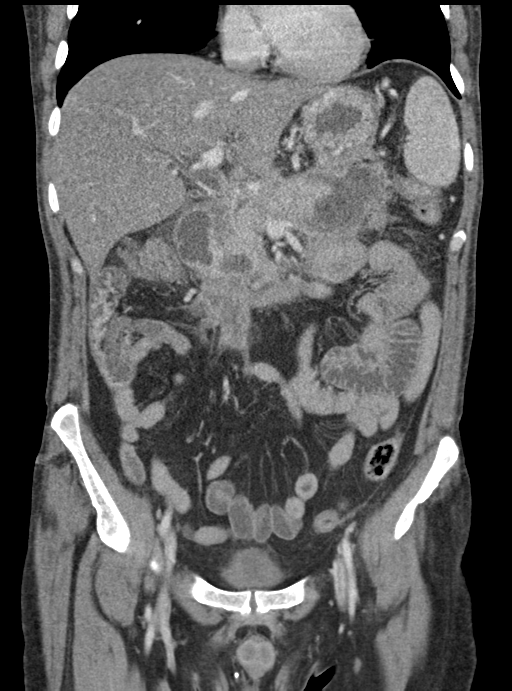
[im 73/131  soft-tissue]
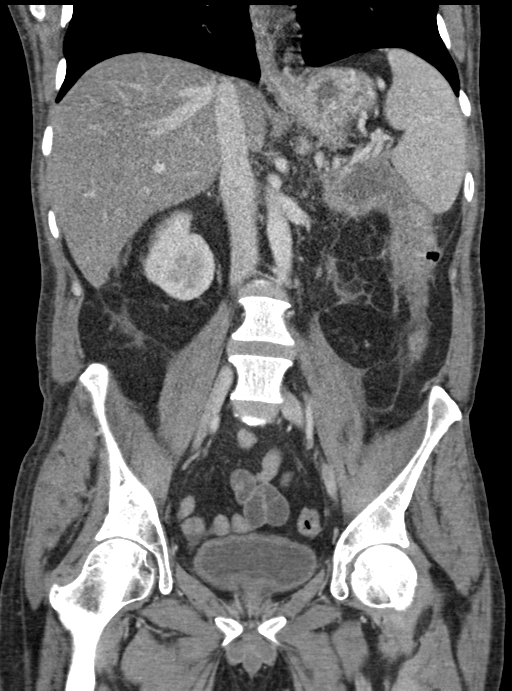

[14 of 46 positions shown; findings below may reference images not displayed]

FINDINGS: Lower chest: Lung bases are clear. No consolidation. No pleural
effusion.

Hepatobiliary: Marked hepatic steatosis. Main portal vein remains
patent, see below. Biliary duct distension with transition in the
pancreatic head, this is developed since the previous exam.
Stranding generalize in the upper abdomen some of which is adjacent
to the gallbladder. This is of uncertain significance

Pancreas: Occlusion of splenic vein. Narrowing of the main portal
vein, further narrowing of the peripheral main portal vein since the
prior study. (Image 28, series 2) collateral formation with numerous
gastroepiploic collaterals and gastric varices which have developed
since the previous exam.

[DATE] x 1.9 cm collection in the anterior pancreas does contain a
small amount of gas compatible with walled-off necrosis involving
the pancreatic head in the pancreatic-duodenal groove.

Further necrosis of the gland.

Walled off necrosis in the tail of the pancreas measuring 5.7 x
cm (image 27, series 2) previously 6.7 x 7.2 cm.

This extends into the root of the mesentery and the anterior
pararenal space in the LEFT hemiabdomen.

Abundant stranding about the pancreas and duodenum. SMV is patent as
described above.

Spleen: Spleen is enlarged. Small amount of perisplenic fluid and
fluid in the gastrohepatic recess. Wall up necrosis extends into the
splenic hilum.

Adrenals/Urinary Tract: Adrenal glands are normal.

Symmetric renal enhancement. No hydronephrosis. Urinary bladder is
normal.

Stomach/Bowel: Stomach with gastric varices. Peri duodenal stranding
and stranding throughout the root of the small bowel mesentery

No sign of bowel obstruction.

Colonic thickening at the hepatic flexure and splenic flexure
similar at the a hepatic flexure but improved with respect to
splenic flexure thickening when compared to the previous study.

Vascular/Lymphatic: Calcified and noncalcified atheromatous plaque
of the abdominal aorta. No adenopathy in the retroperitoneum.

No adenopathy in the pelvis.

Reproductive: Prostate unremarkable by CT.

Other: Small volume ascites in the pelvis. Diminished when compared
to the prior study.

Musculoskeletal: No acute musculoskeletal process. No destructive
bone finding.
IMPRESSION: 1. Walled-off necrosis with further organization of necrotic
pancreatic collections about the pancreas tracking into splenic
hilum and showing further necrosis of the gland at greater than 50%
necrosis. Findings suspicious for acute pancreatitis superimposed on
sequela of previous pancreatitis discussed above.
2. Small locules of gas in the pancreatic head within areas of
walled-off necrosis suspicious for early infection.
3. Occlusion of the splenic vein. Narrowing of the main portal vein,
further narrowing of the peripheral main portal vein since the prior
study. This results in interval development of upper abdominal
venous collaterals and gastric varices.
4. Biliary duct distension approximately 9 mm of the common bile
duct has developed since the prior study. The gallbladder is less
distended than on the prior exam. No overt signs of acute
cholecystitis though there is generalized inflammation in the upper
abdomen. Close attention on follow-up.
5. Colonic thickening at the hepatic flexure and splenic flexure
similar at the a hepatic flexure but improved with respect to
splenic flexure thickening when compared to the previous study.
6. Small volume ascites in the pelvis. Diminished when compared to
the prior study.
7. Marked hepatic steatosis.
8. Aortic atherosclerosis.

Aortic Atherosclerosis (17RND-YPI.I).
